# Patient Record
Sex: Female | Born: 1945
Health system: Southern US, Community
[De-identification: ages and names within clinical notes are randomized; demographics above are authoritative.]

## PROBLEM LIST (undated history)

## (undated) DIAGNOSIS — N289 Disorder of kidney and ureter, unspecified: Secondary | ICD-10-CM

## (undated) DIAGNOSIS — Z9851 Tubal ligation status: Secondary | ICD-10-CM

## (undated) DIAGNOSIS — E119 Type 2 diabetes mellitus without complications: Secondary | ICD-10-CM

## (undated) DIAGNOSIS — I10 Essential (primary) hypertension: Secondary | ICD-10-CM

## (undated) HISTORY — PX: KIDNEY TRANSPLANT: SHX239

## (undated) HISTORY — PX: PERITONEAL CATHETER INSERTION: SHX2223

## (undated) HISTORY — PX: PERITONEAL CATHETER REMOVAL: SUR1106

---

## 1997-02-24 ENCOUNTER — Other Ambulatory Visit: Admission: RE | Admit: 1997-02-24 | Discharge: 1997-02-24 | Payer: Self-pay | Admitting: Obstetrics and Gynecology

## 1997-06-27 ENCOUNTER — Other Ambulatory Visit: Admission: RE | Admit: 1997-06-27 | Discharge: 1997-06-27 | Payer: Self-pay | Admitting: Obstetrics and Gynecology

## 1997-08-02 ENCOUNTER — Ambulatory Visit (HOSPITAL_COMMUNITY): Admission: RE | Admit: 1997-08-02 | Discharge: 1997-08-02 | Payer: Self-pay | Admitting: Obstetrics and Gynecology

## 1997-08-04 ENCOUNTER — Other Ambulatory Visit: Admission: RE | Admit: 1997-08-04 | Discharge: 1997-08-04 | Payer: Self-pay | Admitting: Obstetrics and Gynecology

## 1997-08-19 ENCOUNTER — Ambulatory Visit (HOSPITAL_COMMUNITY): Admission: RE | Admit: 1997-08-19 | Discharge: 1997-08-19 | Payer: Self-pay | Admitting: Obstetrics and Gynecology

## 1997-11-28 ENCOUNTER — Other Ambulatory Visit: Admission: RE | Admit: 1997-11-28 | Discharge: 1997-11-28 | Payer: Self-pay | Admitting: Obstetrics and Gynecology

## 1998-03-16 ENCOUNTER — Ambulatory Visit (HOSPITAL_COMMUNITY): Admission: RE | Admit: 1998-03-16 | Discharge: 1998-03-16 | Payer: Self-pay | Admitting: Obstetrics and Gynecology

## 1998-03-16 ENCOUNTER — Encounter: Payer: Self-pay | Admitting: Obstetrics and Gynecology

## 1998-03-20 ENCOUNTER — Other Ambulatory Visit: Admission: RE | Admit: 1998-03-20 | Discharge: 1998-03-20 | Payer: Self-pay | Admitting: Obstetrics and Gynecology

## 1998-08-18 ENCOUNTER — Other Ambulatory Visit: Admission: RE | Admit: 1998-08-18 | Discharge: 1998-08-18 | Payer: Self-pay | Admitting: Obstetrics and Gynecology

## 1998-09-01 ENCOUNTER — Ambulatory Visit (HOSPITAL_COMMUNITY): Admission: RE | Admit: 1998-09-01 | Discharge: 1998-09-01 | Payer: Self-pay | Admitting: Obstetrics and Gynecology

## 1998-09-01 ENCOUNTER — Encounter: Payer: Self-pay | Admitting: Obstetrics and Gynecology

## 1998-09-13 ENCOUNTER — Ambulatory Visit (HOSPITAL_COMMUNITY): Admission: RE | Admit: 1998-09-13 | Discharge: 1998-09-13 | Payer: Self-pay | Admitting: Obstetrics and Gynecology

## 1998-09-13 ENCOUNTER — Encounter: Payer: Self-pay | Admitting: Obstetrics and Gynecology

## 1998-10-10 ENCOUNTER — Encounter (INDEPENDENT_AMBULATORY_CARE_PROVIDER_SITE_OTHER): Payer: Self-pay | Admitting: Specialist

## 1998-10-10 ENCOUNTER — Ambulatory Visit (HOSPITAL_COMMUNITY): Admission: RE | Admit: 1998-10-10 | Discharge: 1998-10-10 | Payer: Self-pay | Admitting: Obstetrics and Gynecology

## 1999-08-23 ENCOUNTER — Other Ambulatory Visit: Admission: RE | Admit: 1999-08-23 | Discharge: 1999-08-23 | Payer: Self-pay | Admitting: Obstetrics and Gynecology

## 1999-10-10 ENCOUNTER — Ambulatory Visit (HOSPITAL_COMMUNITY): Admission: RE | Admit: 1999-10-10 | Discharge: 1999-10-10 | Payer: Self-pay | Admitting: Obstetrics and Gynecology

## 1999-10-10 ENCOUNTER — Encounter: Payer: Self-pay | Admitting: Obstetrics and Gynecology

## 2000-03-10 ENCOUNTER — Other Ambulatory Visit: Admission: RE | Admit: 2000-03-10 | Discharge: 2000-03-10 | Payer: Self-pay | Admitting: Obstetrics and Gynecology

## 2000-08-27 ENCOUNTER — Other Ambulatory Visit: Admission: RE | Admit: 2000-08-27 | Discharge: 2000-08-27 | Payer: Self-pay | Admitting: Obstetrics and Gynecology

## 2000-10-15 ENCOUNTER — Encounter: Payer: Self-pay | Admitting: Obstetrics and Gynecology

## 2000-10-15 ENCOUNTER — Ambulatory Visit (HOSPITAL_COMMUNITY): Admission: RE | Admit: 2000-10-15 | Discharge: 2000-10-15 | Payer: Self-pay | Admitting: Obstetrics and Gynecology

## 2000-11-11 ENCOUNTER — Encounter: Payer: Self-pay | Admitting: Family Medicine

## 2000-11-11 ENCOUNTER — Ambulatory Visit (HOSPITAL_COMMUNITY): Admission: RE | Admit: 2000-11-11 | Discharge: 2000-11-11 | Payer: Self-pay | Admitting: Family Medicine

## 2001-03-05 ENCOUNTER — Other Ambulatory Visit: Admission: RE | Admit: 2001-03-05 | Discharge: 2001-03-05 | Payer: Self-pay | Admitting: Obstetrics and Gynecology

## 2001-09-10 ENCOUNTER — Other Ambulatory Visit: Admission: RE | Admit: 2001-09-10 | Discharge: 2001-09-10 | Payer: Self-pay | Admitting: Obstetrics and Gynecology

## 2001-11-24 ENCOUNTER — Ambulatory Visit (HOSPITAL_COMMUNITY): Admission: RE | Admit: 2001-11-24 | Discharge: 2001-11-24 | Payer: Self-pay | Admitting: Obstetrics and Gynecology

## 2001-11-24 ENCOUNTER — Encounter: Payer: Self-pay | Admitting: Obstetrics and Gynecology

## 2002-03-08 ENCOUNTER — Other Ambulatory Visit: Admission: RE | Admit: 2002-03-08 | Discharge: 2002-03-08 | Payer: Self-pay | Admitting: Obstetrics and Gynecology

## 2002-10-07 ENCOUNTER — Other Ambulatory Visit: Admission: RE | Admit: 2002-10-07 | Discharge: 2002-10-07 | Payer: Self-pay | Admitting: Obstetrics and Gynecology

## 2003-01-04 ENCOUNTER — Ambulatory Visit (HOSPITAL_COMMUNITY): Admission: RE | Admit: 2003-01-04 | Discharge: 2003-01-04 | Payer: Self-pay | Admitting: Obstetrics and Gynecology

## 2003-05-06 ENCOUNTER — Other Ambulatory Visit: Admission: RE | Admit: 2003-05-06 | Discharge: 2003-05-06 | Payer: Self-pay | Admitting: Obstetrics and Gynecology

## 2003-10-10 ENCOUNTER — Other Ambulatory Visit: Admission: RE | Admit: 2003-10-10 | Discharge: 2003-10-10 | Payer: Self-pay | Admitting: *Deleted

## 2004-01-25 ENCOUNTER — Ambulatory Visit (HOSPITAL_COMMUNITY): Admission: RE | Admit: 2004-01-25 | Discharge: 2004-01-25 | Payer: Self-pay | Admitting: Obstetrics and Gynecology

## 2004-03-27 ENCOUNTER — Other Ambulatory Visit: Admission: RE | Admit: 2004-03-27 | Discharge: 2004-03-27 | Payer: Self-pay | Admitting: *Deleted

## 2004-10-10 ENCOUNTER — Other Ambulatory Visit: Admission: RE | Admit: 2004-10-10 | Discharge: 2004-10-10 | Payer: Self-pay | Admitting: Obstetrics and Gynecology

## 2005-02-08 ENCOUNTER — Ambulatory Visit (HOSPITAL_COMMUNITY): Admission: RE | Admit: 2005-02-08 | Discharge: 2005-02-08 | Payer: Self-pay | Admitting: Obstetrics and Gynecology

## 2005-10-16 ENCOUNTER — Other Ambulatory Visit: Admission: RE | Admit: 2005-10-16 | Discharge: 2005-10-16 | Payer: Self-pay | Admitting: Obstetrics and Gynecology

## 2006-03-04 ENCOUNTER — Ambulatory Visit (HOSPITAL_COMMUNITY): Admission: RE | Admit: 2006-03-04 | Discharge: 2006-03-04 | Payer: Self-pay | Admitting: *Deleted

## 2006-10-22 ENCOUNTER — Other Ambulatory Visit: Admission: RE | Admit: 2006-10-22 | Discharge: 2006-10-22 | Payer: Self-pay | Admitting: Obstetrics and Gynecology

## 2007-03-23 ENCOUNTER — Ambulatory Visit (HOSPITAL_COMMUNITY): Admission: RE | Admit: 2007-03-23 | Discharge: 2007-03-23 | Payer: Self-pay | Admitting: Obstetrics and Gynecology

## 2007-12-28 ENCOUNTER — Other Ambulatory Visit: Admission: RE | Admit: 2007-12-28 | Discharge: 2007-12-28 | Payer: Self-pay | Admitting: Obstetrics and Gynecology

## 2008-03-29 ENCOUNTER — Encounter (HOSPITAL_COMMUNITY): Admission: RE | Admit: 2008-03-29 | Discharge: 2008-06-27 | Payer: Self-pay | Admitting: Nephrology

## 2008-04-24 ENCOUNTER — Inpatient Hospital Stay (HOSPITAL_COMMUNITY): Admission: EM | Admit: 2008-04-24 | Discharge: 2008-04-29 | Payer: Self-pay | Admitting: Emergency Medicine

## 2008-04-26 ENCOUNTER — Ambulatory Visit: Payer: Self-pay | Admitting: Vascular Surgery

## 2008-04-27 ENCOUNTER — Encounter (INDEPENDENT_AMBULATORY_CARE_PROVIDER_SITE_OTHER): Payer: Self-pay | Admitting: *Deleted

## 2008-05-09 ENCOUNTER — Ambulatory Visit: Payer: Self-pay | Admitting: Surgery

## 2008-05-20 ENCOUNTER — Ambulatory Visit (HOSPITAL_COMMUNITY): Admission: RE | Admit: 2008-05-20 | Discharge: 2008-05-20 | Payer: Self-pay | Admitting: Surgery

## 2008-05-20 ENCOUNTER — Ambulatory Visit: Payer: Self-pay | Admitting: Surgery

## 2008-06-10 ENCOUNTER — Ambulatory Visit (HOSPITAL_COMMUNITY): Admission: RE | Admit: 2008-06-10 | Discharge: 2008-06-10 | Payer: Self-pay | Admitting: Surgery

## 2008-07-25 ENCOUNTER — Ambulatory Visit: Payer: Self-pay | Admitting: Surgery

## 2008-09-14 ENCOUNTER — Ambulatory Visit: Payer: Self-pay | Admitting: Vascular Surgery

## 2008-09-21 ENCOUNTER — Ambulatory Visit: Payer: Self-pay | Admitting: Surgery

## 2008-09-21 ENCOUNTER — Ambulatory Visit (HOSPITAL_COMMUNITY): Admission: RE | Admit: 2008-09-21 | Discharge: 2008-09-21 | Payer: Self-pay | Admitting: Surgery

## 2008-10-05 ENCOUNTER — Ambulatory Visit (HOSPITAL_COMMUNITY): Admission: RE | Admit: 2008-10-05 | Discharge: 2008-10-05 | Payer: Self-pay | Admitting: Surgery

## 2008-10-31 ENCOUNTER — Encounter: Admission: RE | Admit: 2008-10-31 | Discharge: 2008-10-31 | Payer: Self-pay | Admitting: Internal Medicine

## 2008-10-31 ENCOUNTER — Ambulatory Visit: Payer: Self-pay | Admitting: Surgery

## 2008-12-23 ENCOUNTER — Ambulatory Visit (HOSPITAL_COMMUNITY): Admission: RE | Admit: 2008-12-23 | Discharge: 2008-12-23 | Payer: Self-pay | Admitting: Nephrology

## 2009-01-11 ENCOUNTER — Ambulatory Visit: Payer: Self-pay | Admitting: Surgery

## 2009-02-07 ENCOUNTER — Emergency Department (HOSPITAL_COMMUNITY): Admission: EM | Admit: 2009-02-07 | Discharge: 2009-02-07 | Payer: Self-pay | Admitting: Emergency Medicine

## 2009-10-09 IMAGING — CR DG CHEST 2V
2 series · 2 of 2 positions shown · non-contrast
Comparison: 04/26/2008

CLINICAL DATA: Preop.

CHEST - 2 VIEW

[view not recorded (1 of 2)]
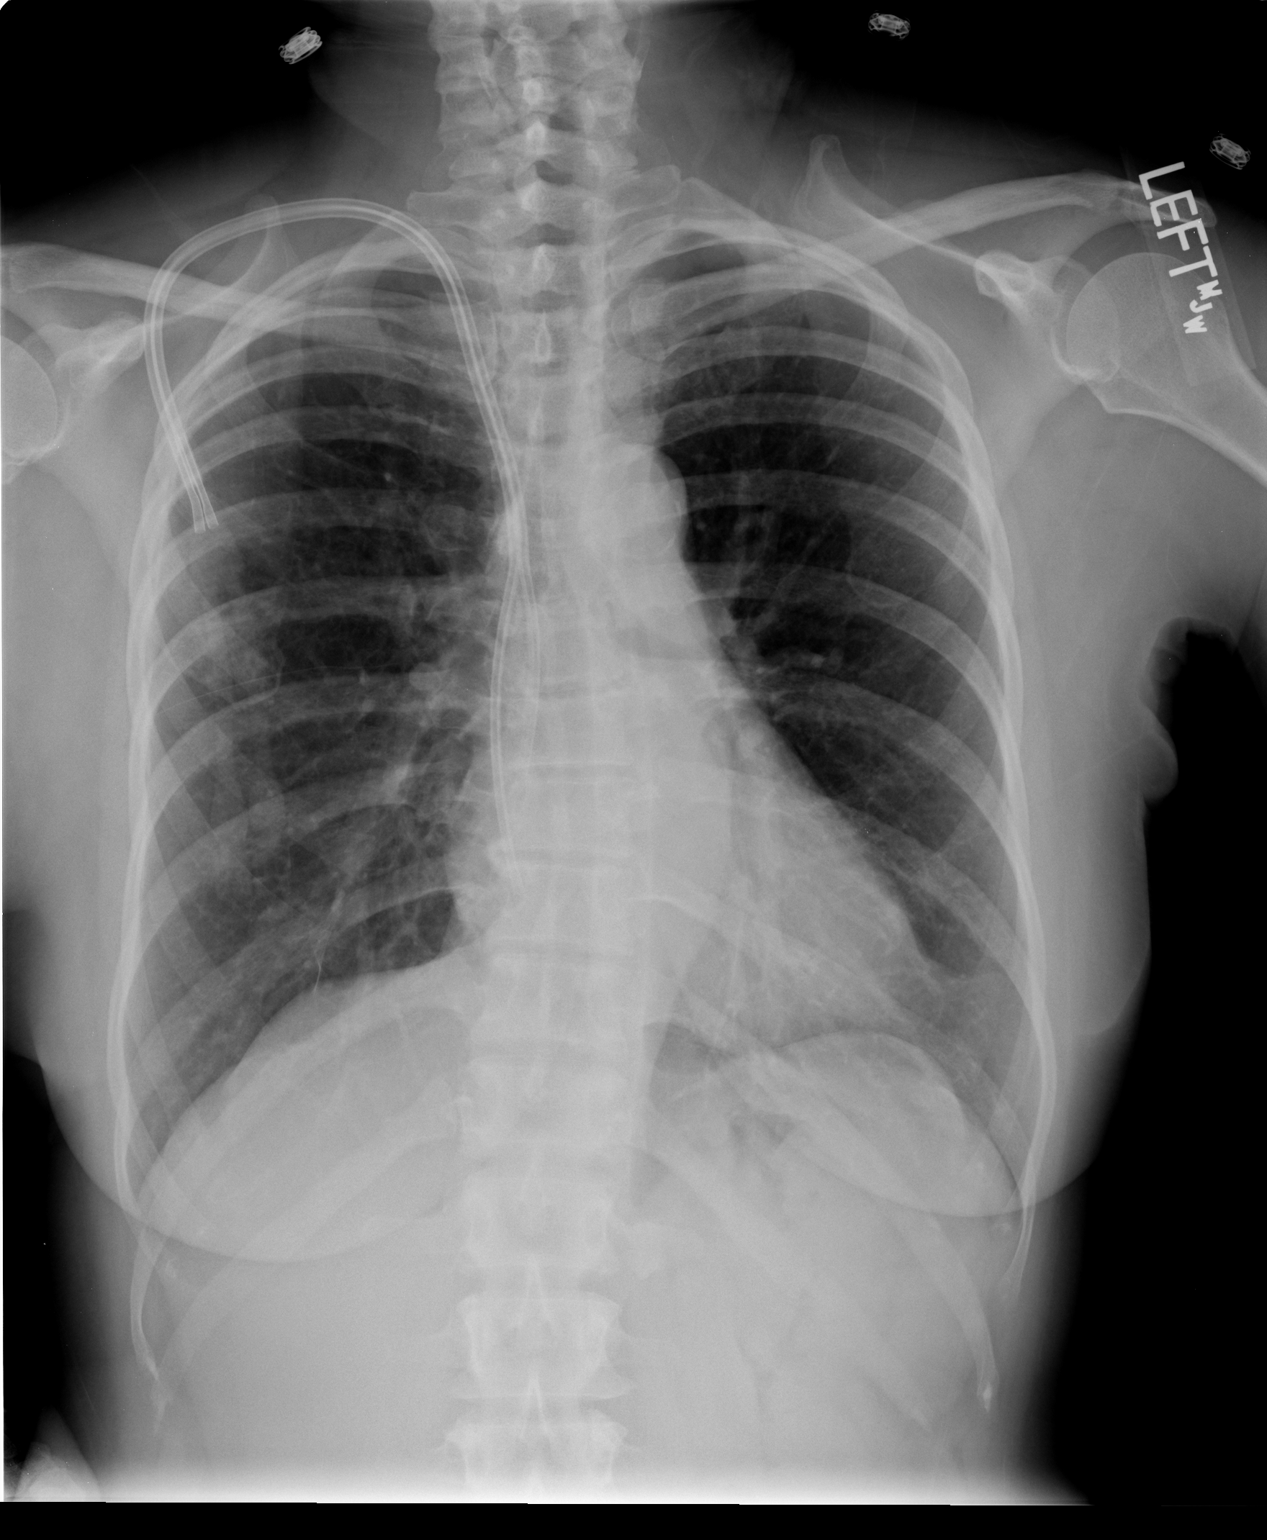

[view not recorded (2 of 2)]
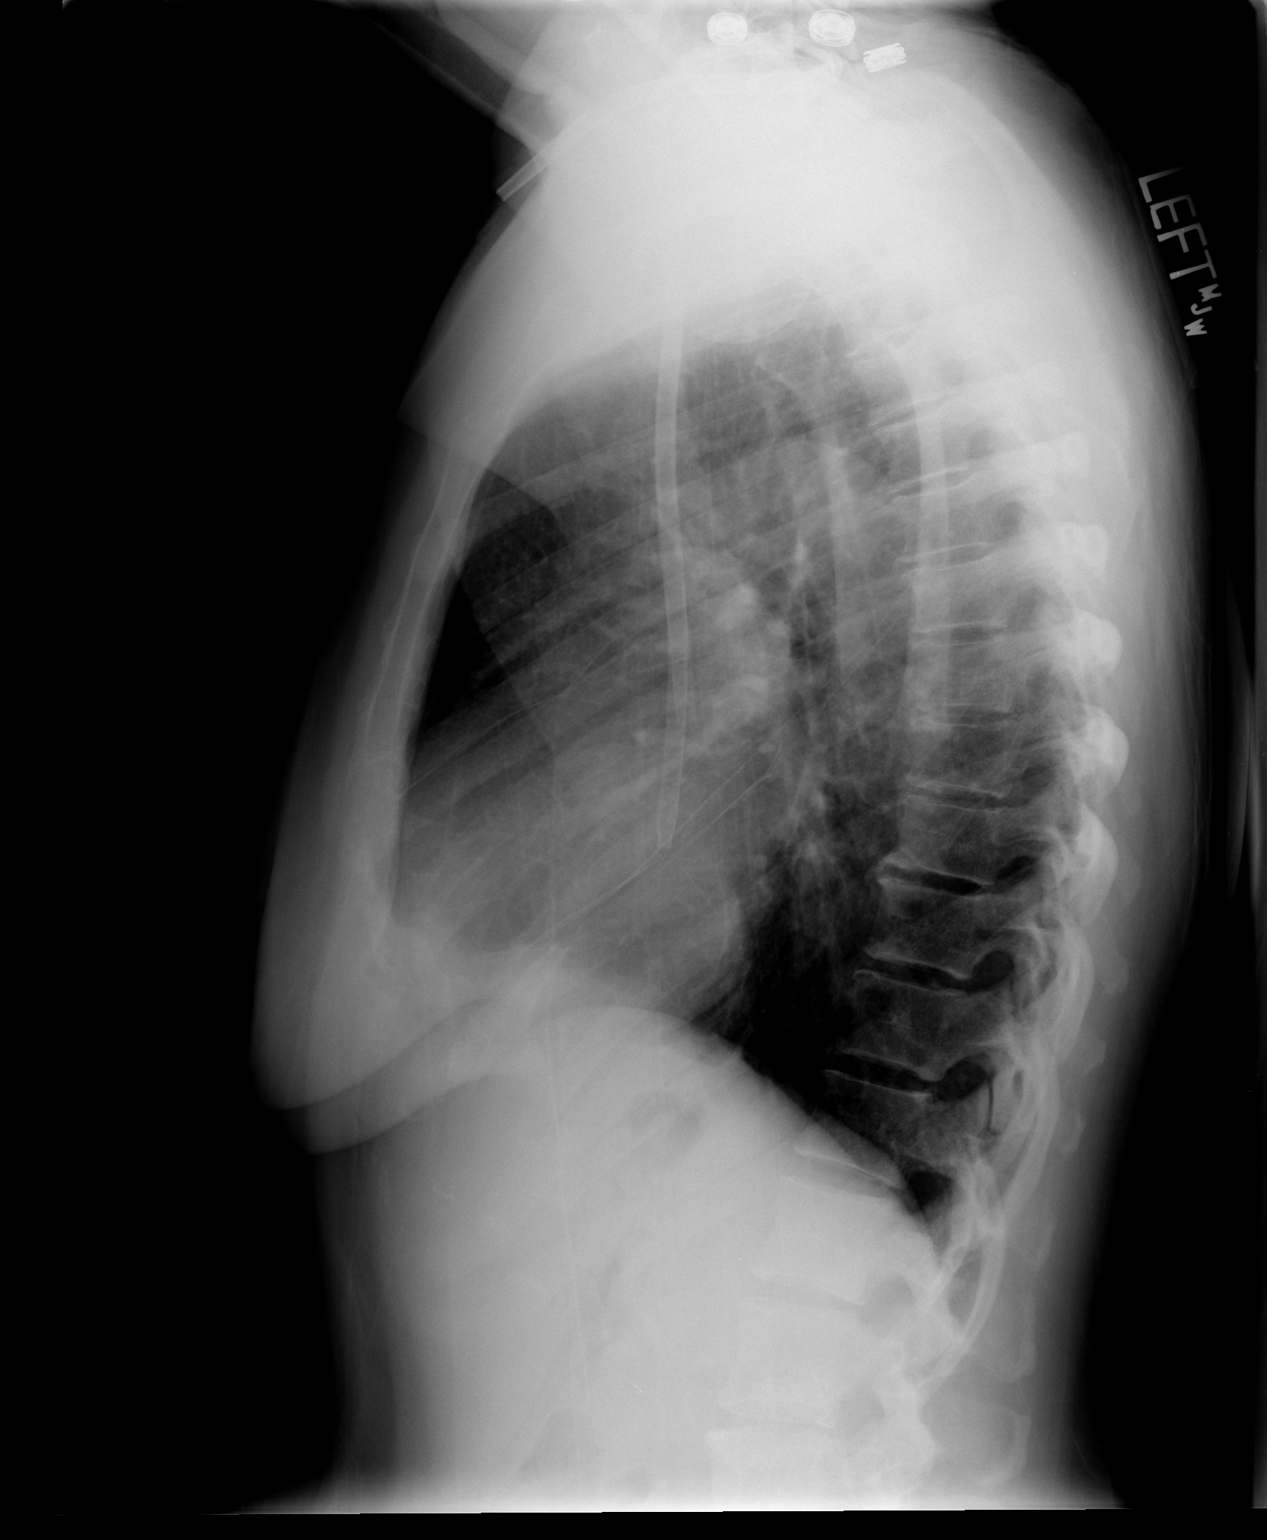

[2 of 2 positions shown; findings below may reference images not displayed]

FINDINGS: Trachea is midline.  Heart size normal.  Thoracic aorta
is calcified.  Right IJ dual lumen catheter tips project over the
SVC/RA junction and right atrium, respectively.  Mild bibasilar
subsegmental atelectasis and/or scarring.  No pleural fluid.  No
edema.
IMPRESSION: Mild bibasilar atelectasis and/or scarring.

## 2010-02-04 ENCOUNTER — Encounter: Payer: Self-pay | Admitting: Obstetrics and Gynecology

## 2010-02-16 ENCOUNTER — Other Ambulatory Visit: Payer: Self-pay

## 2010-02-16 DIAGNOSIS — Z1231 Encounter for screening mammogram for malignant neoplasm of breast: Secondary | ICD-10-CM

## 2010-02-28 ENCOUNTER — Encounter (HOSPITAL_COMMUNITY): Payer: Self-pay

## 2010-02-28 ENCOUNTER — Ambulatory Visit (HOSPITAL_COMMUNITY)
Admission: RE | Admit: 2010-02-28 | Discharge: 2010-02-28 | Disposition: A | Discharge status: Home / Self Care | Payer: Medicare Other | Source: Ambulatory Visit | Attending: Family Medicine | Admitting: Family Medicine

## 2010-02-28 DIAGNOSIS — Z1231 Encounter for screening mammogram for malignant neoplasm of breast: Secondary | ICD-10-CM | POA: Insufficient documentation

## 2010-04-01 LAB — CSF CELL COUNT WITH DIFFERENTIAL
RBC Count, CSF: 0 /mm3
RBC Count, CSF: 0 /mm3
Tube #: 1
Tube #: 4
WBC, CSF: 0 /mm3 (ref 0–5)
WBC, CSF: 0 /mm3 (ref 0–5)

## 2010-04-01 LAB — DIFFERENTIAL
Eosinophils Absolute: 0.1 10*3/uL (ref 0.0–0.7)
Lymphocytes Relative: 13 % (ref 12–46)
Lymphs Abs: 0.7 10*3/uL (ref 0.7–4.0)
Monocytes Relative: 8 % (ref 3–12)
Neutrophils Relative %: 77 % (ref 43–77)

## 2010-04-01 LAB — CBC
HCT: 37.7 % (ref 36.0–46.0)
MCV: 102 fL — ABNORMAL HIGH (ref 78.0–100.0)
RBC: 3.7 MIL/uL — ABNORMAL LOW (ref 3.87–5.11)
WBC: 5.5 10*3/uL (ref 4.0–10.5)

## 2010-04-01 LAB — POCT I-STAT, CHEM 8
BUN: 28 mg/dL — ABNORMAL HIGH (ref 6–23)
Calcium, Ion: 1.06 mmol/L — ABNORMAL LOW (ref 1.12–1.32)
Chloride: 99 meq/L (ref 96–112)
Creatinine, Ser: 8.1 mg/dL — ABNORMAL HIGH (ref 0.4–1.2)
Glucose, Bld: 92 mg/dL (ref 70–99)
HCT: 38 % (ref 36.0–46.0)
Hemoglobin: 12.9 g/dL (ref 12.0–15.0)
Potassium: 4.3 meq/L (ref 3.5–5.1)
Sodium: 138 meq/L (ref 135–145)
TCO2: 38 mmol/L (ref 0–100)

## 2010-04-01 LAB — CSF CULTURE W GRAM STAIN: Culture: NO GROWTH

## 2010-04-01 LAB — PROTEIN AND GLUCOSE, CSF
Glucose, CSF: 59 mg/dL (ref 43–76)
Total  Protein, CSF: 27 mg/dL (ref 15–45)

## 2010-04-01 LAB — PROTIME-INR: INR: 1.02 (ref 0.00–1.49)

## 2010-04-20 LAB — POCT I-STAT, CHEM 8
BUN: 31 mg/dL — ABNORMAL HIGH (ref 6–23)
Calcium, Ion: 1.01 mmol/L — ABNORMAL LOW (ref 1.12–1.32)
Chloride: 102 mEq/L (ref 96–112)
Creatinine, Ser: 9.2 mg/dL — ABNORMAL HIGH (ref 0.4–1.2)
TCO2: 29 mmol/L (ref 0–100)

## 2010-04-20 LAB — POCT I-STAT 4, (NA,K, GLUC, HGB,HCT)
Potassium: 4.4 mEq/L (ref 3.5–5.1)
Sodium: 135 mEq/L (ref 135–145)

## 2010-04-24 LAB — POCT I-STAT 4, (NA,K, GLUC, HGB,HCT)
Glucose, Bld: 86 mg/dL (ref 70–99)
HCT: 43 % (ref 36.0–46.0)
Hemoglobin: 14.6 g/dL (ref 12.0–15.0)
Potassium: 3.9 mEq/L (ref 3.5–5.1)
Sodium: 138 mEq/L (ref 135–145)

## 2010-04-25 LAB — BASIC METABOLIC PANEL
BUN: 131 mg/dL — ABNORMAL HIGH (ref 6–23)
CO2: 15 mEq/L — ABNORMAL LOW (ref 19–32)
Calcium: 9 mg/dL (ref 8.4–10.5)
Creatinine, Ser: 19.27 mg/dL — ABNORMAL HIGH (ref 0.4–1.2)
GFR calc non Af Amer: 3 mL/min — ABNORMAL LOW (ref >60)
Glucose, Bld: 125 mg/dL — ABNORMAL HIGH (ref 70–99)
Glucose, Bld: 129 mg/dL — ABNORMAL HIGH (ref 70–99)
Potassium: 4.3 mEq/L (ref 3.5–5.1)
Sodium: 135 mEq/L (ref 135–145)

## 2010-04-25 LAB — IRON AND TIBC
Iron: 12 ug/dL — ABNORMAL LOW (ref 42–135)
Saturation Ratios: 9 % — ABNORMAL LOW (ref 20–55)

## 2010-04-25 LAB — POCT I-STAT 3, ART BLOOD GAS (G3+)
Acid-base deficit: 13 mmol/L — ABNORMAL HIGH (ref 0.0–2.0)
Bicarbonate: 11.2 mEq/L — ABNORMAL LOW (ref 20.0–24.0)
O2 Saturation: 97 %
pO2, Arterial: 100 mmHg (ref 80.0–100.0)

## 2010-04-25 LAB — HEMOGLOBIN: Hemoglobin: 10.6 g/dL — ABNORMAL LOW (ref 12.0–15.0)

## 2010-04-25 LAB — RENAL FUNCTION PANEL
Albumin: 2.4 g/dL — ABNORMAL LOW (ref 3.5–5.2)
Calcium: 8.5 mg/dL (ref 8.4–10.5)
Creatinine, Ser: 13.93 mg/dL — ABNORMAL HIGH (ref 0.4–1.2)
GFR calc Af Amer: 3 mL/min — ABNORMAL LOW (ref >60)
GFR calc non Af Amer: 3 mL/min — ABNORMAL LOW (ref >60)

## 2010-04-25 LAB — POCT I-STAT, CHEM 8
BUN: >140 mg/dL — ABNORMAL HIGH (ref 6–23)
Calcium, Ion: 1.02 mmol/L — ABNORMAL LOW (ref 1.12–1.32)
Creatinine, Ser: 16 mg/dL — ABNORMAL HIGH (ref 0.4–1.2)
TCO2: 7 mmol/L (ref 0–100)

## 2010-04-25 LAB — COMPREHENSIVE METABOLIC PANEL
ALT: 8 U/L (ref 0–35)
AST: 10 U/L (ref 0–37)
Albumin: 2.2 g/dL — ABNORMAL LOW (ref 3.5–5.2)
Alkaline Phosphatase: 37 U/L — ABNORMAL LOW (ref 39–117)
Chloride: 97 mEq/L (ref 96–112)
GFR calc Af Amer: 3 mL/min — ABNORMAL LOW (ref >60)
Potassium: 4.5 mEq/L (ref 3.5–5.1)
Sodium: 131 mEq/L — ABNORMAL LOW (ref 135–145)
Total Bilirubin: 0.6 mg/dL (ref 0.3–1.2)
Total Protein: 5.3 g/dL — ABNORMAL LOW (ref 6.0–8.3)

## 2010-04-25 LAB — CBC
HCT: 21.4 % — ABNORMAL LOW (ref 36.0–46.0)
HCT: 22.8 % — ABNORMAL LOW (ref 36.0–46.0)
Hemoglobin: 8.5 g/dL — ABNORMAL LOW (ref 12.0–15.0)
MCHC: 34.1 g/dL (ref 30.0–36.0)
Platelets: 220 10*3/uL (ref 150–400)
Platelets: 227 10*3/uL (ref 150–400)
Platelets: 248 10*3/uL (ref 150–400)
RDW: 15.3 % (ref 11.5–15.5)
RDW: 15.6 % — ABNORMAL HIGH (ref 11.5–15.5)
WBC: 8.8 10*3/uL (ref 4.0–10.5)

## 2010-04-25 LAB — TYPE AND SCREEN: Antibody Screen: NEGATIVE

## 2010-04-25 LAB — FOLATE RBC: RBC Folate: 1618 ng/mL — ABNORMAL HIGH (ref 180–600)

## 2010-04-25 LAB — URINALYSIS, ROUTINE W REFLEX MICROSCOPIC
Bilirubin Urine: NEGATIVE
Ketones, ur: NEGATIVE mg/dL
Nitrite: NEGATIVE
Specific Gravity, Urine: 1.017 (ref 1.005–1.030)
Urobilinogen, UA: 0.2 mg/dL (ref 0.0–1.0)

## 2010-04-25 LAB — DIFFERENTIAL
Basophils Absolute: 0 10*3/uL (ref 0.0–0.1)
Basophils Relative: 0 % (ref 0–1)
Monocytes Absolute: 0.3 10*3/uL (ref 0.1–1.0)
Neutro Abs: 6.3 10*3/uL (ref 1.7–7.7)
Neutrophils Relative %: 86 % — ABNORMAL HIGH (ref 43–77)

## 2010-04-25 LAB — ALT: ALT: 21 U/L (ref 0–35)

## 2010-04-25 LAB — POCT CARDIAC MARKERS: Troponin i, poc: <0.05 ng/mL (ref 0.00–0.09)

## 2010-04-25 LAB — URINE MICROSCOPIC-ADD ON

## 2010-04-25 LAB — PTH, INTACT AND CALCIUM: PTH: 617.6 pg/mL — ABNORMAL HIGH (ref 14.0–72.0)

## 2010-04-25 LAB — ABO/RH: ABO/RH(D): B POS

## 2010-04-25 LAB — HEPATITIS B SURFACE ANTIBODY,QUALITATIVE: Hep B S Ab: NEGATIVE

## 2010-04-25 LAB — FERRITIN: Ferritin: 148 ng/mL (ref 10–291)

## 2010-04-26 LAB — POCT HEMOGLOBIN-HEMACUE: Hemoglobin: 7.3 g/dL — CL (ref 12.0–15.0)

## 2010-05-08 ENCOUNTER — Other Ambulatory Visit (HOSPITAL_COMMUNITY): Payer: Self-pay | Admitting: Nephrology

## 2010-05-08 DIAGNOSIS — N186 End stage renal disease: Secondary | ICD-10-CM

## 2010-05-09 ENCOUNTER — Ambulatory Visit (HOSPITAL_COMMUNITY)
Admission: RE | Admit: 2010-05-09 | Discharge: 2010-05-09 | Disposition: A | Discharge status: Home / Self Care | Payer: Medicare Other | Source: Ambulatory Visit | Attending: Nephrology | Admitting: Nephrology

## 2010-05-09 DIAGNOSIS — T82898A Other specified complication of vascular prosthetic devices, implants and grafts, initial encounter: Secondary | ICD-10-CM | POA: Insufficient documentation

## 2010-05-09 DIAGNOSIS — N186 End stage renal disease: Secondary | ICD-10-CM | POA: Insufficient documentation

## 2010-05-09 DIAGNOSIS — Y832 Surgical operation with anastomosis, bypass or graft as the cause of abnormal reaction of the patient, or of later complication, without mention of misadventure at the time of the procedure: Secondary | ICD-10-CM | POA: Insufficient documentation

## 2010-05-09 MED ORDER — IOHEXOL 300 MG/ML  SOLN
100.0000 mL | Freq: Once | INTRAMUSCULAR | Status: AC | PRN
  Administered 2010-05-09: 36 mL via INTRAVENOUS

## 2010-05-18 ENCOUNTER — Other Ambulatory Visit (HOSPITAL_COMMUNITY): Payer: Self-pay | Admitting: Family Medicine

## 2010-05-29 NOTE — Procedures (Signed)
CEPHALIC VEIN MAPPING   INDICATION:  Preop cephalic vein mapping.   HISTORY:  End-stage renal disease.   EXAM:   The right cephalic vein is compressible.   Diameter measurements range from 0.66 to 0.25 cm.   The left cephalic vein is compressible.   Diameter measurements range from 0.43 to 0.24 cm.   See attached worksheet for all measurements.   IMPRESSION:  Patent bilateral cephalic veins which are of acceptable  diameter for use as a dialysis access site.   ___________________________________________  V. Leia Alf, MD   AC/MEDQ  D:  05/09/2008  T:  05/09/2008  Job:  [id number]

## 2010-05-29 NOTE — Op Note (Signed)
[name], VESSELS                  ACCOUNT NO.:  [id number]   MEDICAL RECORD NO.:  [id number]          PATIENT TYPE:  AMB   LOCATION:  SDS                          FACILITY:  Home   PHYSICIAN:  Theotis Burrow IV, MDDATE OF BIRTH:  Aug 15, 1945   DATE OF PROCEDURE:  06/10/2008  DATE OF DISCHARGE:  06/10/2008                               OPERATIVE REPORT   PREOPERATIVE DIAGNOSIS:  End-stage renal disease.   POSTOPERATIVE DIAGNOSIS:  End-stage renal disease.   PROCEDURE PERFORMED:  Left upper arm arteriovenous fistula.   SURGEON:  1. Leia Alf, MD   ASSISTANT:  Chad Cordial, PA   ANESTHESIA:  General.   FINDINGS:  Vein accepted a #4 dilator.   INDICATIONS:  This is a 65 year old female with end-stage renal disease.  Three weeks ago, she underwent a left AV fistula, which thrombosed.  She  comes in today for upper arm fistula versus graft.   PROCEDURE:  The patient identified in the holding area and taken to room  6.  She was placed supine on the table.  General anesthesia was  administered.  The patient was prepped and draped in a standard sterile  fashion.  A time-out was called.  Antibiotics were given.  Ultrasound  was used to identify the course of cephalic vein in the upper arm.  It  was an adequate caliber vein and easily compressible. A transverse  incision was made in the antecubital crease.  The vein was identified  within the incision.  It was lateral and the arm was encircled with a  vessel loop.  It was mobilized distally for approximately 1.5 cm as I  needed extra vein to reach the artery.  The vein was also mobilized up  the arm.  Side branches were divided between 3-0 silk ties and metal  clips.  The vein was then marked with an ink pen to ensure proper  orientation.  Next, brachial artery was identified under the fascia that  was mobilized proximally and distally.  This was a 2.5-mm artery.  Next,  the patient was given systemic heparinization.  A  right angle was placed  on the distal exposed vein.  It was then transected.  The distal end was  ligated with a 3-0 silk tie.  Next, the artery was occluded with Serafin  clamps.  The vein was then dilated with sequential coronary dilators up  to a #4 dilator.  There was good back bleeding.  The vein easily flushed  with heparinized saline.  A #11 blade was used to make an arteriotomy,  which was extended with Potts scissors.  Vein was then further  spatulated and end-to-side anastomosis was performed using running 6-0  Prolene.  Prior to completion, the artery was flushed in antegrade and  retrograde fashion.  The anastomosis was then secured.  Clamps were  released.  I then made sure that there were no  kinks  within the vein.  There was a palpable thrill up to the upper arm.  The  wound was then irrigated.  The deep tissue was reapproximated  with 3-0  Vicryl and Dermabond was placed.  The patient tolerated the procedure  well and had no complications.      [name], MD  Electronically Signed     VWB/MEDQ  D:  06/10/2008  T:  06/11/2008  Job:  [id number]

## 2010-05-29 NOTE — Op Note (Signed)
NAMECHRISSA, [name]                  ACCOUNT NO.:  [id number]   MEDICAL RECORD NO.:  [id number]          PATIENT TYPE:  INP   LOCATION:  6711                         FACILITY:  [facility]   PHYSICIAN:  Jessy Oto. Fields, MD  DATE OF BIRTH:  02-Nov-1945   DATE OF PROCEDURE:  04/26/2008  DATE OF DISCHARGE:                               OPERATIVE REPORT   PROCEDURES:  1. Ultrasound of neck  2. Right internal jugular vein Diatek catheter.   PREOPERATIVE DIAGNOSIS:  Renal failure.   POSTOPERATIVE DIAGNOSIS:  Renal failure.   ANESTHESIA:  Local with IV sedation.   OPERATIVE DETAILS:  After obtaining informed consent, the patient was  taken to the operating room.  The patient was placed in supine position  on the operating table.  After adequate sedation, the patient's neck was  inspected with an ultrasound.  The right internal jugular vein was found  to have normal compressibility and respiratory variation.  Next, the  patient's entire neck and chest were prepped and draped in usual sterile  fashion.  Local anesthesia was infiltrated over the right internal  jugular vein.  An introducer needle was used to cannulate the right  internal jugular vein under ultrasound guidance.  A 0.035 J-tip  guidewire was then threaded through the right internal jugular vein down  to the inferior vena cava under fluoroscopic guidance.  Next, sequential  12, 14, and 16-French dilators with peel-away sheath placed over the  guidewire down into the right atrium.  Guidewire and dilator were  removed, and a 23-cm Diatek catheter was placed through the peel-away  sheath down into the right atrium.  The peel-away sheath was then  removed.  The catheter was tunneled subcutaneously, cut to length, and  the hub attached.  The catheter was noted to flush and draw easily.  Catheter was inspected under fluoroscopy, found its tip to be in the  right atrium, no kinks throughout its course.  The catheter was sutured  to skin  with nylon sutures.  The neck insertion site was closed with 0  Vicryl stitch.  The patient tolerated the procedure well and there were  no complications.  Instrument, sponge, and needle counts were correct at  the end of the case.  The patient was taken to recovery room in stable  condition.      Jessy Oto. Fields, MD  Electronically Signed     CEF/MEDQ  D:  04/26/2008  T:  04/27/2008  Job:  [id number]

## 2010-05-29 NOTE — Procedures (Signed)
VASCULAR LAB EXAM   INDICATION:  Difficulty with venous cannulation of the left arm AV  fistula.   HISTORY:  Diabetes:  No.  Cardiac:  No.  Hypertension:  Yes.   EXAM:  Left arm AV fistula duplex.   IMPRESSION:  1. Patent left brachiocephalic AV fistula.  2. There is an elevated velocity of 610 cm/sec noted in the left      outflow vein at the distal brachium level which is most likely due      to vessel tortuosity.  3. There is an elevated velocity of 490 cm/sec noted in the left mid      brachium level outflow vein which appears to be the results of mild      plaque formation.  4. There is a patent branch measuring 0.22 cm noted at the proximal to      mid brachium level outflow vein with minimal internal flow.  5. A preliminary report was faxed to [facility] office on      01/11/2009.     ___________________________________________  V. Leia Alf, MD   CH/MEDQ  D:  01/11/2009  T:  01/12/2009  Job:  [id number]

## 2010-05-29 NOTE — Assessment & Plan Note (Signed)
OFFICE VISIT   [name], Terri L  DOB:  December 05, 1945                                       07/25/2008  [id number]   REASON FOR VISIT:  Follow up fistula.   HISTORY:  This is a 65 year old female who underwent left upper arm AV  fistula on 06/10/08.  She comes in today for follow-up.   Again, I feel a strong thrill within her fistula.  It does takes a  somewhat surreptitious course in her upper arm.  I think it is not quite  ready for use.   I am going to give this another month, and I will see her back then.  Prior to her seeing me to see me in a month, I will get a left arm  fistula ultrasound to see if there are any reasons that this is not  matured.   [name], MD  Electronically Signed   VWB/MEDQ  D:  07/25/2008  T:  07/26/2008  Job:  [id number]   cc:   Louis Meckel, M.D.  Darrold Span. Florene Glen, M.D.

## 2010-05-29 NOTE — Procedures (Signed)
VASCULAR LAB EXAM   INDICATION:  Follow up left upper extremity AVF.   HISTORY:  Diabetes:  No.  Cardiac:  No.  Hypertension:  Yes.   EXAM:  1. Left upper extremity AVF duplex.  2. See attached drawing for velocities.   IMPRESSION:  1. Patent right upper extremity arteriovenous fistula in the brachium.  2. However, elevated velocities at anastomosis and in the cephalic      vein and branch where the branch splits off.  3. Large branch noted off of cephalic vein near the anastomosis.      ___________________________________________  Jessy Oto Fields, MD   AS/MEDQ  D:  09/14/2008  T:  09/14/2008  Job:  [id number]

## 2010-05-29 NOTE — Op Note (Signed)
[name], [name]                  ACCOUNT NO.:  [id number]   MEDICAL RECORD NO.:  [id number]          PATIENT TYPE:  AMB   LOCATION:  SDS                          FACILITY:  [facility]   PHYSICIAN:  Theotis Burrow IV, MDDATE OF BIRTH:  Apr 30, 1945   DATE OF PROCEDURE:  05/20/2008  DATE OF DISCHARGE:  05/20/2008                               OPERATIVE REPORT   PREOPERATIVE DIAGNOSIS:  End-stage renal disease.   POSTOPERATIVE DIAGNOSIS:  End-stage renal disease.   PROCEDURE PERFORMED:  Left wrist AV fistula.   SURGEON:  1. Annamarie Major IV, MD   ANESTHESIA:  General.   BLOOD LOSS:  Minimal.   COMPLICATIONS:  None.   PROCEDURE:  The patient was identified in the holding area and taken to  the room 6.  She was placed supine on the table.  The left arm was  prepped and draped in a standard sterile fashion.  A time-out was  called.  Antibiotics were given.  Ultrasound was used to map the course  of the cephalic vein in the lower arm.  A transverse incision was made 2  finger-breadths above the radial head.  Vein was identified in the  incision and encircled with a vessel loop.  It was mobilized proximally  and distally.  The vein was small in caliber, approximately 2 to 2.5 mm.  Side branches were divided between 3-0 silk ties.  Next, the artery was  identified within the incision, that was exposed proximally and  distally.  The artery after exposure was somewhat redundant.  At this  point, the patient is given 3 dose units of heparin.  The vein was  distal and the vein was transected at the end.  Distal end was ligated  with 2-0 silk tie.  The artery was occluded with vascular clamps.  A #11  blade was used to make an arteriotomy.  Arteriotomy was extended with  Pott scissors.  The vein was then cut to the appropriate length and the  end was spatulated.  An end-to-side anastomosis was created with a  running 7-0 Prolene.  Prior to completion, the repair was evaluated with  a  handheld Doppler.  I was unable to obtain a signal on the radial  artery distal to the anastomosis.  I was concerned that the redundancy  in the artery may be causing a proximal obstruction, had not tied the  anastomosis at this point, therefore it was reopened.  The healed bypass  was re-done with better visualization of the proximal part of the  artery.  It was positioned so that it was not kinked.  I again evaluated  repair with a Doppler, the signals were much improved.  The patient  actually had a palpable radial pulse below the fistula.  The fistula was  palpated under this on  the surface of the arm.  The anastomosis was then completed.  Next, the  wound was irrigated.  Hemostasis was achieved.  Tissue was closed with a  3-0 Vicryl.  Skin was closed with 4-0 Vicryl.  Dermabond was placed.  She was taken  to recovery in stable condition.  There are no  complications.      [name], MD  Electronically Signed     VWB/MEDQ  D:  05/20/2008  T:  05/21/2008  Job:  [id number]

## 2010-05-29 NOTE — Assessment & Plan Note (Signed)
OFFICE VISIT   [name], [name]  DOB:  [birth year]                                       05/09/2008  [id number]   REASON FOR VISIT:  Dialysis access.   CO-REFERRING PHYSICIAN:  Erling Cruz, MD.   HISTORY:  This is a 65 year old female I am seeing at the request of Dr.  Florene Glen and Dr. Moshe Cipro for evaluation of permanent access.  The  patient started dialysis approximately 2 weeks ago and is using her  right-sided catheter.  Her renal failure is secondary to  glomerulonephritis as well as hypertension.  She is right-handed.   PAST MEDICAL HISTORY:  End-stage renal disease, hypercholesterolemia,  hypertension.   FAMILY HISTORY:  Noncontributory.   SOCIAL HISTORY:  She is married with 2 children.  She does not smoke,  she has never smoked.  She does not drink.   REVIEW OF SYSTEMS:  GENERAL:  Positive for weight loss and loss of  appetite.  CARDIAC:  Negative.  PULMONARY:  Negative.  GI:  Negative.  GU:  Positive for new renal failure.  VASCULAR:  Negative.  NEURO:  Negative.  ORTHO:  Positive for arthritis, joint pain, muscle pain.  PSYCH:  Negative.  ENT:  Negative.  HEME:  Negative.   MEDICATIONS:  Please see medical record.   ALLERGIES:  None.   PHYSICAL EXAMINATION:  Blood pressure 123/82, pulse 72, respirations 20.  General:  She is well-appearing, in no distress.  Cardiovascular:  Regular rate and rhythm, respirations nonlabored.  She has a palpable  radial pulse bilaterally.   DIAGNOSTIC STUDIES:  Vein mapping was performed today which reveals her  to have adequate cephalic vein bilaterally.   ASSESSMENT/PLAN:  End-stage renal disease needing permanent access.  I  discussed the risks and benefits of graft versus fistula.  Based on the  size and caliber of her cephalic vein, I feel like we should start by  performing a left radiocephalic fistula.  Her vein measures  approximately 2.4-mm in this area; I discussed the reasoning  for  starting at the wrist.  She understands that there is a risk of non-  maturity and this could require future interventions.  Her operation has  been scheduled for Friday, May 7th.   [name], MD  Electronically Signed   VWB/MEDQ  D:  05/09/2008  T:  05/10/2008  Job:  [id number]   cc:   Louis Meckel, M.D.  Darrold Span. Florene Glen, M.D.

## 2010-05-29 NOTE — Discharge Summary (Signed)
[name], [name]                  ACCOUNT NO.:  [id number]   MEDICAL RECORD NO.:  [id number]          PATIENT TYPE:  INP   LOCATION:  6711                         FACILITY:  [facility]   PHYSICIAN:  Alvin C. Florene Glen, M.D.  DATE OF BIRTH:  1945/09/17   DATE OF ADMISSION:  04/24/2008  DATE OF DISCHARGE:  04/29/2008                               DISCHARGE SUMMARY   ADMITTING DIAGNOSES:  1. End-stage renal failure.  2. Uremia.  3. Metabolic acidosis.  4. Hypertension, uncontrolled.  5. Anemia of chronic disease.  6. Pulmonary edema secondary to volume overload of renal failure.  7. Hyperlipidemia.   DISCHARGE DIAGNOSES:  1. New end-stage renal disease, now on chronic hemodialysis.  2. Hypertension, improved control.  3. Iron-deficiency anemia.  4. Secondary hyperparathyroidism.  5. Metabolic acidosis, resolved with dialysis.  6. Pulmonary edema, resolved with dialysis.  7. Hyperlipidemia.   PROCEDURE:  On April 24, 2008, right femoral Vas-Cath placed by Dr.  Florene Glen.  On April 26, 2008, right internal jugular tunneled and dialysis  catheter placed by Dr. Ruta Hinds.  On April 11, initiation of first  dialysis treatment.   BRIEF HISTORY:  A 84-year black female with chronic kidney disease stage  V followed by Dr. Moshe Cipro at [facility] who was  seen last in the office on February 10 when she was initially assessed  and felt to have chronic glomerulonephritis.  The patient presented to  the emergency room with several weeks of progressive malaise, anorexia,  nausea, vomiting, and marked shortness of breath.  She was found to have  a BUN of greater than 114 and a creatinine of 19 upon admission.  Her O2  saturations were 90%, and her blood pressure was 196/105.  She was  admitted for urgent initiation of dialysis.   LABS ON ADMISSION:  Sodium 136, potassium 5.7, chloride 116, and CO2 of  7.  Hemoglobin 8.6 white count 7300, and platelets 248,000.  Urinalysis  shows greater than 300 mg of protein, 11-20 red blood cells, and 3-6  white blood cells.  Renal ultrasound shows a 10.1-cm right kidney and  9.9-cm left kidney, both are echogenic.  Chest x-ray shows interstitial  edema.   HOSPITAL COURSE:  End-stage renal disease.  The patient underwent  dialysis treatments on April 11, 12, 14, and 15.  She initially  underwent dialysis via femoral Vas-Cath in the first 2 treatments.  That  was discontinued and she had a right internal jugular PermCath placed by  Dr. Oneida Alar.  She tolerated dialysis relatively well.  Overall she had  approximately 5 kg weight loss with ultrafiltration on dialysis.  Her  blood pressure control improved.  Her oxygenation returned to normal.  She was without symptoms and eating well.  Vein mapping was done of both  upper extremities and her left arm shows cephalic vein to be patent  without significant wall thickening.  There is branching in the mid to  distal forearm.  Dr. Oneida Alar' office is to contact the patient to set up  an elective left arm AV Gore-Tex graft versus fistula  on a non-dialysis  day.   The patient's transferrin saturation level returned at 7% with a  ferritin level of 148.  InFeD loading was initiated, and she will  continue to receive 100 mg IV each dialysis x9 more doses then 15 mg IV  weekly.  Aranesp was dosed at maximum dose of 200 mcg x1.  As an  outpatient, she will be continued on EPO 28,000 units IV each dialysis.  She was transfused 2 units of packed red blood cells this admission for  a hemoglobin of 7.3.   Intact parathyroid hormone level 617.6.  For this, she will receive  Hectorol starting at 1.0 mcg IV each dialysis.  Renagel was used as a  phosphate binder at 800 mg 2 pills with each meal.   The patient's blood pressure was approximately 145/81 at the time of  discharge.  Her dry weight is estimated at 62 kg.  She will dialyze for  4 hours every Tuesday, Thursday, and Saturday at 6:45  in the morning at  the [facility].   DISCHARGE MEDICATIONS:  1. Clonidine 0.1 mg b.i.d.  2. Nephro-Vite vitamin 1 daily.  3. Renagel 800 mg 2 with meals.  4. Lipitor 20 mg q.p.m.  5. Zyrtec 10 mg daily.  6. Progesterone half pill daily.  7. Vivelle estradiol tablet 0.025 mg patch 2 times weekly.  8. EPO 28,000 units IV each dialysis.  9. InFeD 100 mg IV each dialysis x9 then 50 mg IV weekly.  10.Hectorol 1.0 mcg IV each dialysis.      [name], P.A.    ______________________________  Darrold Span Florene Glen, M.D.    RRK/MEDQ  D:  04/29/2008  T:  04/30/2008  Job:  [id number]   cc:   [facility]

## 2010-05-29 NOTE — H&P (Signed)
[name], [name]                  ACCOUNT NO.:  [id number]   MEDICAL RECORD NO.:  [id number]          PATIENT TYPE:  INP   LOCATION:  2606                         FACILITY:  [facility]   PHYSICIAN:  Darrold Span. Florene Glen, M.D.  DATE OF BIRTH:  [birth year]   DATE OF ADMISSION:  04/24/2008  DATE OF DISCHARGE:                              HISTORY & PHYSICAL   HISTORY & PHYSICAL:  [name] is a 65 year old female with chronic  kidney disease followed by Dr. Moshe Cipro at [facility].  Dr. Moshe Cipro apparently has been planning renal  replacement therapy.  However, the patient presents now with a couple of  week history of progressive malaise, anorexia, nausea, vomiting, and  shortness of breath.  She presented to the emergency room and she was  found to have a BUN of greater than 114, and creatinine of approximately  19.  She is admitted now for further treatment.   PAST MEDICAL HISTORY:  1. Hypertension from childbirth.  2. History of microhematuria.  3. History of anemia, receiving erythropoietin.   SOCIAL HISTORY:  Nonsmoker, nondrinker.  Employed at the [employer].   FAMILY HISTORY:  Negative for kidney disease.   REVIEW OF SYSTEMS:  Essentially noncontributory except for complaints of  significant back pain that has been bothering her over the past couple  of days, particularly in her flanks.   MEDICATIONS:  Medications prior to admission include blood pressure  medicines, unknown type.   PHYSICAL EXAMINATION:  GENERAL:  Pleasant, obese African American  female.  VITAL SIGNS:  Blood pressure is currently 162/92.  She is afebrile.  Heart rate 108.  HEENT:  Atraumatic, normocephalic.  She is wearing BiPAP.  There is  slight neck vein distention.  LUNGS:  Decreased breath sounds at the bases and a few crackles that are  not audible.  HEART:  Tachycardia.  ABDOMEN:  Soft, obese, no tenderness.  EXTREMITIES:  Trace edema in lower extremities.   LABORATORY  DATA:  Sodium 136, potassium 5.7, chloride 116, CO2 is 27,  BUN greater than 114, creatinine 16 and repeat is 19.  Hemoglobin is  8.6, white blood count 7300, and platelets 248,000.  Urinalysis, greater  than 300 mg protein, 11-20 red blood cells, 3-6 white blood cells.  Renal ultrasound shows a 10.1-cm right kidney, but 9.9 cm left kidney  and both are echogenic.  Chest x-ray, slight amount of cardiomegaly and  evidence of significant interstitial edema.   ASSESSMENT:  1. Renal failure, end stage, with evidence of uremic symptomatology.  2. Hypertension, suboptimal control.  3. Metabolic acidosis, secondary to renal failure.  4. Anemia secondary to renal failure.  5. Pulmonary edema/volume overload secondary to renal failure.   PLAN:  1. Hemodialysis to be started today.  2. We will need to place femoral catheter because the patient is      unable to tolerate supine position.  3. Scheduled Permcath and AV access within the next couple of days.  4. Erythropoietin, multivitamins, phosphate bindings as needed.           ______________________________  Darrold Span. Florene Glen, M.D.     ACP/MEDQ  D:  04/24/2008  T:  04/25/2008  Job:  [id number]

## 2010-05-29 NOTE — Assessment & Plan Note (Signed)
OFFICE VISIT   [name], Terri L  DOB:  September 24, 1945                                       10/31/2008  [id number]   REASON FOR VISIT:  Follow-up.   HISTORY:  This is a 65 year old female with end-stage renal disease on  dialysis through a right-sided catheter.  She underwent left  brachiocephalic fistula on Jun 10, 2008.  She was lost to follow-up from  [place].  Her fistula did not mature.  She has undergone a fistulogram which  revealed a competing branch.  This was ligated, in the operating room at  the end of September on September 22.  She comes back for follow-up.  She has been doing well.  There is a dramatic change in the thrill in  her fistula.  I think we are heading in the right direction with this, I  would not access fistula for 1 month.  I do think this will provide good  access for her.  If there is any questions.  She will contact us again.   [name], MD  Electronically Signed   VWB/MEDQ  D:  10/31/2008  T:  11/01/2008  Job:  [id number]   cc:   Dr. Florene Glen

## 2010-05-29 NOTE — Assessment & Plan Note (Signed)
OFFICE VISIT   [name], [name]  DOB:  1945/04/02                                       09/14/2008  [id number]   The patient was last seen by Dr. Trula Slade on 07/25/2008.  At that time  her fistula but not completely matured and she was scheduled for  followup in August.  For some reason her followup appointment did not  occur and she returned to the office today and a duplex exam of her left  brachiocephalic AV fistula was performed.  She is currently dialyzing  via a right-sided catheter.  The fistula has not matured sufficiently  for use.  She currently dialyzes on Tuesday, Thursday and Saturday in  [place].   PHYSICAL EXAMINATION:  On physical exam today blood pressure is 111/67  in the right arm, pulse 69 and regular.  Left upper extremity shows a  well-healed left antecubital incision with a palpable thrill in the  brachiocephalic fistula.  This has dilated significantly over the  proximal 5 cm.  However, in the upper arm it is difficult to palpate.  There is an audible bruit up into the upper arm.   Duplex ultrasound today showed an increased velocity across the arterial  anastomosis as well as approximately 5 cm from the fistula origin and a  large side branch near this as well.   I believe the best option for the patient at this point would be a  fistulogram and possible angioplasty if there is a high level of  stenosis as suggested by ultrasound.  We have scheduled this with Dr.  Trula Slade on 09/21/2008.  I discussed with the patient today the risks,  benefits, possible complications of fistulogram and angioplasty  including but not limited to bleeding.   Jessy Oto. Fields, MD  Electronically Signed   CEF/MEDQ  D:  09/14/2008  T:  09/15/2008  Job:  [id number]   cc:   Louis Meckel, M.D.  Darrold Span. Florene Glen, M.D.

## 2010-06-20 ENCOUNTER — Ambulatory Visit (HOSPITAL_COMMUNITY): Payer: Medicare Other

## 2010-06-20 ENCOUNTER — Inpatient Hospital Stay (HOSPITAL_COMMUNITY)
Admission: RE | Admit: 2010-06-20 | Discharge: 2010-06-21 | DRG: 673 | Disposition: A | Discharge status: Home / Self Care | Payer: Medicare Other | Source: Ambulatory Visit | Attending: General Surgery | Admitting: General Surgery

## 2010-06-20 DIAGNOSIS — K219 Gastro-esophageal reflux disease without esophagitis: Secondary | ICD-10-CM | POA: Diagnosis present

## 2010-06-20 DIAGNOSIS — N2581 Secondary hyperparathyroidism of renal origin: Secondary | ICD-10-CM | POA: Diagnosis present

## 2010-06-20 DIAGNOSIS — N186 End stage renal disease: Secondary | ICD-10-CM | POA: Diagnosis present

## 2010-06-20 DIAGNOSIS — I120 Hypertensive chronic kidney disease with stage 5 chronic kidney disease or end stage renal disease: Principal | ICD-10-CM | POA: Diagnosis present

## 2010-06-20 DIAGNOSIS — Z992 Dependence on renal dialysis: Secondary | ICD-10-CM

## 2010-06-20 HISTORY — PX: PERITONEAL CATHETER INSERTION: SHX2223

## 2010-06-20 LAB — CBC
Platelets: 248 10*3/uL (ref 150–400)
RBC: 3.95 MIL/uL (ref 3.87–5.11)
WBC: 6.5 10*3/uL (ref 4.0–10.5)

## 2010-06-20 LAB — BASIC METABOLIC PANEL
Calcium: 9.3 mg/dL (ref 8.4–10.5)
Chloride: 92 mEq/L — ABNORMAL LOW (ref 96–112)
GFR calc Af Amer: 5 mL/min — ABNORMAL LOW (ref >60)
GFR calc non Af Amer: 4 mL/min — ABNORMAL LOW (ref >60)
Potassium: 5.2 mEq/L — ABNORMAL HIGH (ref 3.5–5.1)
Potassium: 4.7 mEq/L (ref 3.5–5.1)
Sodium: 138 mEq/L (ref 135–145)
Sodium: 139 mEq/L (ref 135–145)

## 2010-06-20 LAB — DIFFERENTIAL
Basophils Absolute: 0.1 10*3/uL (ref 0.0–0.1)
Basophils Relative: 1 % (ref 0–1)
Eosinophils Absolute: 0.1 10*3/uL (ref 0.0–0.7)
Lymphs Abs: 2.1 10*3/uL (ref 0.7–4.0)
Neutrophils Relative %: 54 % (ref 43–77)

## 2010-06-20 LAB — SURGICAL PCR SCREEN
MRSA, PCR: NEGATIVE
Staphylococcus aureus: NEGATIVE

## 2010-06-21 ENCOUNTER — Ambulatory Visit (HOSPITAL_COMMUNITY): Payer: Medicare Other

## 2010-06-21 LAB — CBC
HCT: 34.2 % — ABNORMAL LOW (ref 36.0–46.0)
Hemoglobin: 11.6 g/dL — ABNORMAL LOW (ref 12.0–15.0)
RDW: 13.4 % (ref 11.5–15.5)
WBC: 7.1 10*3/uL (ref 4.0–10.5)

## 2010-06-21 LAB — BASIC METABOLIC PANEL
GFR calc Af Amer: 3 mL/min — ABNORMAL LOW (ref >60)
GFR calc non Af Amer: 3 mL/min — ABNORMAL LOW (ref >60)
Glucose, Bld: 109 mg/dL — ABNORMAL HIGH (ref 70–99)
Potassium: 5.6 mEq/L — ABNORMAL HIGH (ref 3.5–5.1)
Sodium: 132 mEq/L — ABNORMAL LOW (ref 135–145)

## 2010-06-25 NOTE — Op Note (Signed)
[name], [name]              ACCOUNT NO.:  [id number]  MEDICAL RECORD NO.:  [id number]  LOCATION:  6702                         FACILITY:  [facility]  PHYSICIAN:  Darene Lamer. Ajai Terhaar, M.D.DATE OF BIRTH:  06-29-1945  DATE OF PROCEDURE:  06/20/2010 DATE OF DISCHARGE:                              OPERATIVE REPORT   PREOPERATIVE DIAGNOSIS:  End-stage renal disease, desire for a peritoneal dialysis.  POSTOPERATIVE DIAGNOSIS:  End-stage renal disease, desire for peritoneal dialysis.  SURGICAL PROCEDURES:  Laparoscopic placement of [place] peritoneal dialysis catheter.  SURGEON:  Darene Lamer. Jaisha Villacres, MD  ANESTHESIA:  General.  BRIEF HISTORY:  [name] is a 65 year old female with end-stage renal disease.  She has been on hemodialysis for 2 years but is interested and motivated for a peritoneal dialysis and felt by her renal physician to be a good candidate.  We discussed laparoscopic placement of a peritoneal dialysis catheter including nature of the procedure, indications, risks of anesthetic complications, bleeding, infection, intestinal injury were discussed with long-term risks of the catheter including displacement, occlusion, peritonitis, and exit site infection. She desires to proceed.  She is brought to the operating room for this procedure.  DESCRIPTION OF OPERATION:  The patient was brought to the operating room and placed in supine position on the operating table and general endotracheal anesthesia was induced.  The abdomen was widely and sterilely prepped and draped.  She received preoperative IV antibiotics. Correct patient and procedure were verified.  Access was obtained with a 5-mm OptiVu trocar in the left upper quadrant midclavicular line.  On initial entry, the tissue planes were well visualized but the camera was placed and I was in fatty tissue rather than the free peritoneal cavity. I felt I was beneath the omentum and carefully pulled back the  catheter and then entered the free peritoneal cavity.  Inspection showed some air beneath the lesser omentum just above the hepatic flexure.  There was no evidence of bleeding or bowel injury and I proceeded with placement plan to check that area at the end of the procedure.  The patient had been marked for an exit site in the left lower quadrant.  Site about 5 cm above and medial to this was chosen and under direct vision a 12-mm trocar was placed.  A left-sided [place] catheter was then oriented and introduced through the trocar and a Silastic ball passed just deep to the peritoneum and holding this in place.  The trocar was slowly backed out.  The Silastic ball was slightly up above the peritoneum.  At this point, I placed a second 5-mm trocar in the right lower quadrant and positioned the catheter with the Silastic ball completely beneath the peritoneum and the Teflon cuff above the peritoneum and rectus sheath. The distal catheter was positioned deep in the pelvis and lay naturally. Following this, the abdomen was desufflated.  The catheter was tunneled from the entrance point subcutaneously to the exit site and brought out with the cuff about a centimeter deep to the skin.  Following this, the abdomen was reinsufflated.  The catheter was in good position.  I then carefully examined the right upper quadrant.  The omentum was lifted and the  transverse hepatic flexure of the colon carefully examined and there was no evidence of bowel injury.  There was a very tiny hematoma at the base of the mesentery of the right colon which was adjacent to the duodenum, the duodenum could be clearly seen.  There was no evidence of any bowel injury again.  No evidence of bleeding.  Following this, the abdomen was desufflated and trocars removed.  The extension tubing was attached to the catheter.  Skin incisions were closed with running 5-0 nylon.  Dressings were applied.  The patient taken to  recovery in good condition.     Darene Lamer. Chameka Mcmullen, M.D.     [name]  D:  06/20/2010  T:  06/21/2010  Job:  [id number]  Electronically Signed by Excell Seltzer M.D. on 06/25/2010 01:39:02 PM

## 2010-07-24 NOTE — Discharge Summary (Signed)
  [name], [name]              ACCOUNT NO.:  [id number]  MEDICAL RECORD NO.:  [id number]  LOCATION:  6709                         FACILITY:  [facility]  PHYSICIAN:  Darene Lamer. Mikhia Dusek, M.D.DATE OF BIRTH:  [birth year]  DATE OF ADMISSION:  06/20/2010 DATE OF DISCHARGE:  06/21/2010                              DISCHARGE SUMMARY   DISCHARGE DIAGNOSIS:  End-stage renal disease.  OPERATIONS AND PROCEDURES:  Laparoscopic placement of [place] peritoneal dialysis catheter.  HISTORY OF PRESENT ILLNESS:  The patient is a 65 year old female with end-stage renal disease due to hypertension.  She is interested in peritoneal dialysis and has been through preoperative evaluation and training and is felt to be a good candidate.  She is electively brought to the hospital for placement of peritoneal dialysis catheter.  PAST MEDICAL HISTORY:  SURGERY:  Only significant for tubal ligation and fistula creation, medically treated for hypertension, end-stage renal disease.  MEDICATIONS:  Clonidine, Rena-Vite, Vivelle, multivitamins, Protonix.  ALLERGIES:  No known drug allergies.  Social history, family history, and review of systems; see detailed H and P.  PERTINENT PHYSICAL EXAM:  GENERAL:  She is 5 feet, weight 136 pounds. VITAL SIGNS:  Within normal limits. Physical exam essentially unremarkable with a functioning shunt present.  HOSPITAL COURSE:  The patient was admitted on the morning of procedure, underwent uneventful laparoscopic placement of a [place] peritoneal dialysis catheter.  Her postoperative course was unremarkable.  The catheter flushed and drained well.  She had minimal pain.  White count was 6.5, hemoglobin 13.2 the following day and abdomen was benign.  She underwent hemodialysis under the direction of the renal service and was discharged on June 21, 2010, with followup in the peritoneal dialysis training center.  Prescription for Vicodin was given.  The medications are  otherwise unchanged.    Darene Lamer. Shalaine Payson, M.D.    [name]  D:  07/16/2010  T:  07/16/2010  Job:  [id number]  Electronically Signed by Excell Seltzer M.D. on 07/24/2010 03:19:13 PM

## 2010-07-25 ENCOUNTER — Telehealth (INDEPENDENT_AMBULATORY_CARE_PROVIDER_SITE_OTHER): Payer: Self-pay | Admitting: General Surgery

## 2010-07-25 NOTE — Telephone Encounter (Signed)
Please contact Baldemar Friday nurse with [facility] dialysis calling with concerns on [name]. You placed a perotineal cath on 06-20-10 and the patient is having tremendous pain. I have scheduled her to come in on 7/27 for evaluation. [name] would like to speak with you for some advise. Thanks [name]

## 2010-07-27 ENCOUNTER — Ambulatory Visit
Admission: RE | Admit: 2010-07-27 | Discharge: 2010-07-27 | Disposition: A | Discharge status: Home / Self Care | Payer: Medicare Other | Source: Ambulatory Visit | Attending: General Surgery | Admitting: General Surgery

## 2010-07-27 ENCOUNTER — Ambulatory Visit (INDEPENDENT_AMBULATORY_CARE_PROVIDER_SITE_OTHER): Payer: Medicare Other | Admitting: General Surgery

## 2010-07-27 ENCOUNTER — Other Ambulatory Visit (INDEPENDENT_AMBULATORY_CARE_PROVIDER_SITE_OTHER): Payer: Self-pay

## 2010-07-27 ENCOUNTER — Encounter (INDEPENDENT_AMBULATORY_CARE_PROVIDER_SITE_OTHER): Payer: Self-pay | Admitting: General Surgery

## 2010-07-27 VITALS — BP 142/88 | HR 78 | Temp 97.4°F | Ht 59.5 in | Wt 133.8 lb

## 2010-07-27 DIAGNOSIS — Z992 Dependence on renal dialysis: Secondary | ICD-10-CM

## 2010-07-27 NOTE — Patient Instructions (Signed)
Will call with the results of x-ray next week. We can decide then whether to remove the catheter or not. Please call for any questions.

## 2010-07-27 NOTE — Progress Notes (Signed)
This patient returns 5 weeks following uneventful laparoscopic placement of a peritoneal dialysis catheter. She receives her care at the [facility]. She unfortunately has experienced extreme abdominal pain with her fills. She describes severe crampy lower abdominal pain radiating into her rectum and perineum as the volume of fluid increases. This has been intolerable for her and she is returning to hemodialysis. She wants her catheter removed. The fluid apparently in stools and drains without difficulty. She states she's had the fluid checked for infection and is negative and it is not cloudy.  On examination today her abdomen is soft and minimally if at all tender. Exit site looks fine and her incisions look fine.  Assessment and plan: Abdominal pain with fluid instillation post peritoneal dialysis catheter. No other evidence of infection normal chemical problems. I'm going to obtain a KUB to check catheter placement. If it appears in good position I do not really have any other surgical options for her. If it is out of position we could consider repositioning laparoscopically. I suggested to her that we might want to wait 6 or 8 weeks and retry dialysis as things will be more healed at that time but she feels strongly that she wants the catheter removed. I asked her to think about this for the next few days while we get the KUB results and I will call her at that time. If the catheter is in good position and she still wants it removed we will do that for her.

## 2010-07-29 ENCOUNTER — Emergency Department (HOSPITAL_COMMUNITY)
Admission: EM | Admit: 2010-07-29 | Discharge: 2010-07-29 | Disposition: A | Discharge status: Home / Self Care | Payer: Medicare Other | Attending: Emergency Medicine | Admitting: Emergency Medicine

## 2010-07-29 DIAGNOSIS — E785 Hyperlipidemia, unspecified: Secondary | ICD-10-CM | POA: Insufficient documentation

## 2010-07-29 DIAGNOSIS — I120 Hypertensive chronic kidney disease with stage 5 chronic kidney disease or end stage renal disease: Secondary | ICD-10-CM | POA: Insufficient documentation

## 2010-07-29 DIAGNOSIS — N949 Unspecified condition associated with female genital organs and menstrual cycle: Secondary | ICD-10-CM | POA: Insufficient documentation

## 2010-07-29 DIAGNOSIS — R109 Unspecified abdominal pain: Secondary | ICD-10-CM | POA: Insufficient documentation

## 2010-07-29 DIAGNOSIS — R1110 Vomiting, unspecified: Secondary | ICD-10-CM | POA: Insufficient documentation

## 2010-07-29 DIAGNOSIS — Z992 Dependence on renal dialysis: Secondary | ICD-10-CM | POA: Insufficient documentation

## 2010-07-29 DIAGNOSIS — N186 End stage renal disease: Secondary | ICD-10-CM | POA: Insufficient documentation

## 2010-07-29 LAB — COMPREHENSIVE METABOLIC PANEL
Alkaline Phosphatase: 70 U/L (ref 39–117)
BUN: 35 mg/dL — ABNORMAL HIGH (ref 6–23)
CO2: 28 mEq/L (ref 19–32)
Chloride: 91 mEq/L — ABNORMAL LOW (ref 96–112)
Creatinine, Ser: 13.01 mg/dL — ABNORMAL HIGH (ref 0.50–1.10)
GFR calc Af Amer: 3 mL/min — ABNORMAL LOW (ref >60)
GFR calc non Af Amer: 3 mL/min — ABNORMAL LOW (ref >60)
Glucose, Bld: 97 mg/dL (ref 70–99)
Potassium: 5 mEq/L (ref 3.5–5.1)
Total Bilirubin: 0.2 mg/dL — ABNORMAL LOW (ref 0.3–1.2)

## 2010-07-29 LAB — CBC
HCT: 34.7 % — ABNORMAL LOW (ref 36.0–46.0)
Hemoglobin: 12 g/dL (ref 12.0–15.0)
MCV: 96.1 fL (ref 78.0–100.0)
RBC: 3.61 MIL/uL — ABNORMAL LOW (ref 3.87–5.11)
RDW: 12.9 % (ref 11.5–15.5)
WBC: 9 10*3/uL (ref 4.0–10.5)

## 2010-07-29 LAB — DIFFERENTIAL
Basophils Absolute: 0 10*3/uL (ref 0.0–0.1)
Eosinophils Relative: 1 % (ref 0–5)
Lymphocytes Relative: 22 % (ref 12–46)
Lymphs Abs: 2 10*3/uL (ref 0.7–4.0)
Neutro Abs: 6.3 10*3/uL (ref 1.7–7.7)

## 2010-07-30 ENCOUNTER — Telehealth (INDEPENDENT_AMBULATORY_CARE_PROVIDER_SITE_OTHER): Payer: Self-pay | Admitting: General Surgery

## 2010-07-30 NOTE — Telephone Encounter (Signed)
Talked to pt, she was in ER over weekend with pelvic pain. X-ray shows good position of the PD catheter. She wants it removed.and is firm about this. I told her I would talk to Dr Florene Glen then schedule removal.

## 2010-08-01 NOTE — Telephone Encounter (Signed)
Discussed removal of PD cath

## 2010-08-06 ENCOUNTER — Telehealth (INDEPENDENT_AMBULATORY_CARE_PROVIDER_SITE_OTHER): Payer: Self-pay

## 2010-08-06 NOTE — Progress Notes (Signed)
Pt. Called and aware of results.  She would like to have PD Catheter removed, Dr. Excell Seltzer paged to discuss this with him.

## 2010-08-07 ENCOUNTER — Emergency Department (HOSPITAL_COMMUNITY): Payer: Medicare Other

## 2010-08-07 ENCOUNTER — Encounter (HOSPITAL_COMMUNITY): Payer: Self-pay

## 2010-08-07 ENCOUNTER — Emergency Department (HOSPITAL_COMMUNITY)
Admission: EM | Admit: 2010-08-07 | Discharge: 2010-08-07 | Disposition: A | Discharge status: Home / Self Care | Payer: Medicare Other | Attending: Emergency Medicine | Admitting: Emergency Medicine

## 2010-08-07 ENCOUNTER — Emergency Department (HOSPITAL_COMMUNITY)
Admit: 2010-08-07 | Discharge: 2010-08-07 | Disposition: A | Discharge status: Home / Self Care | Payer: Medicare Other | Attending: Nephrology | Admitting: Nephrology

## 2010-08-07 DIAGNOSIS — K5900 Constipation, unspecified: Secondary | ICD-10-CM | POA: Insufficient documentation

## 2010-08-07 DIAGNOSIS — E875 Hyperkalemia: Secondary | ICD-10-CM | POA: Insufficient documentation

## 2010-08-07 DIAGNOSIS — E785 Hyperlipidemia, unspecified: Secondary | ICD-10-CM | POA: Insufficient documentation

## 2010-08-07 DIAGNOSIS — Z79899 Other long term (current) drug therapy: Secondary | ICD-10-CM | POA: Insufficient documentation

## 2010-08-07 DIAGNOSIS — K3189 Other diseases of stomach and duodenum: Secondary | ICD-10-CM | POA: Insufficient documentation

## 2010-08-07 DIAGNOSIS — R109 Unspecified abdominal pain: Secondary | ICD-10-CM | POA: Insufficient documentation

## 2010-08-07 DIAGNOSIS — R112 Nausea with vomiting, unspecified: Secondary | ICD-10-CM | POA: Insufficient documentation

## 2010-08-07 DIAGNOSIS — I120 Hypertensive chronic kidney disease with stage 5 chronic kidney disease or end stage renal disease: Secondary | ICD-10-CM | POA: Insufficient documentation

## 2010-08-07 DIAGNOSIS — R10819 Abdominal tenderness, unspecified site: Secondary | ICD-10-CM | POA: Insufficient documentation

## 2010-08-07 DIAGNOSIS — N186 End stage renal disease: Secondary | ICD-10-CM | POA: Insufficient documentation

## 2010-08-07 HISTORY — DX: Tubal ligation status: Z98.51

## 2010-08-07 HISTORY — DX: Disorder of kidney and ureter, unspecified: N28.9

## 2010-08-07 HISTORY — DX: Essential (primary) hypertension: I10

## 2010-08-07 LAB — POTASSIUM
Potassium: 7.1 mEq/L (ref 3.5–5.1)
Potassium: 6.7 mEq/L (ref 3.5–5.1)
Potassium: 3.1 mEq/L — ABNORMAL LOW (ref 3.5–5.1)

## 2010-08-07 LAB — DIFFERENTIAL
Basophils Absolute: 0.1 10*3/uL (ref 0.0–0.1)
Basophils Relative: 1 % (ref 0–1)
Eosinophils Absolute: 0.4 10*3/uL (ref 0.0–0.7)
Neutro Abs: 3.7 10*3/uL (ref 1.7–7.7)
Neutrophils Relative %: 50 % (ref 43–77)

## 2010-08-07 LAB — COMPREHENSIVE METABOLIC PANEL
AST: 14 U/L (ref 0–37)
Albumin: 3.3 g/dL — ABNORMAL LOW (ref 3.5–5.2)
Alkaline Phosphatase: 68 U/L (ref 39–117)
Chloride: 86 mEq/L — ABNORMAL LOW (ref 96–112)
Potassium: 6.3 mEq/L (ref 3.5–5.1)
Sodium: 130 mEq/L — ABNORMAL LOW (ref 135–145)
Total Bilirubin: 0.2 mg/dL — ABNORMAL LOW (ref 0.3–1.2)

## 2010-08-07 LAB — CBC
Platelets: 310 10*3/uL (ref 150–400)
RDW: 13 % (ref 11.5–15.5)
WBC: 7.4 10*3/uL (ref 4.0–10.5)

## 2010-08-07 MED ORDER — IOHEXOL 300 MG/ML  SOLN
80.0000 mL | Freq: Once | INTRAMUSCULAR | Status: AC | PRN
  Administered 2010-08-07: 80 mL via INTRAVENOUS

## 2010-08-09 ENCOUNTER — Ambulatory Visit (HOSPITAL_COMMUNITY)
Admission: RE | Admit: 2010-08-09 | Discharge: 2010-08-09 | Disposition: A | Discharge status: Home / Self Care | Payer: Medicare Other | Source: Ambulatory Visit | Attending: General Surgery | Admitting: General Surgery

## 2010-08-09 DIAGNOSIS — T82898A Other specified complication of vascular prosthetic devices, implants and grafts, initial encounter: Secondary | ICD-10-CM | POA: Insufficient documentation

## 2010-08-09 DIAGNOSIS — N186 End stage renal disease: Secondary | ICD-10-CM | POA: Insufficient documentation

## 2010-08-09 DIAGNOSIS — Z992 Dependence on renal dialysis: Secondary | ICD-10-CM | POA: Insufficient documentation

## 2010-08-09 DIAGNOSIS — I120 Hypertensive chronic kidney disease with stage 5 chronic kidney disease or end stage renal disease: Secondary | ICD-10-CM | POA: Insufficient documentation

## 2010-08-09 DIAGNOSIS — Y838 Other surgical procedures as the cause of abnormal reaction of the patient, or of later complication, without mention of misadventure at the time of the procedure: Secondary | ICD-10-CM | POA: Insufficient documentation

## 2010-08-09 DIAGNOSIS — Z4902 Encounter for fitting and adjustment of peritoneal dialysis catheter: Secondary | ICD-10-CM

## 2010-08-09 HISTORY — PX: PERITONEAL CATHETER REMOVAL: SUR1106

## 2010-08-09 LAB — SURGICAL PCR SCREEN: Staphylococcus aureus: NEGATIVE

## 2010-08-09 LAB — POCT I-STAT 4, (NA,K, GLUC, HGB,HCT)
Hemoglobin: 10.9 g/dL — ABNORMAL LOW (ref 12.0–15.0)
Sodium: 134 mEq/L — ABNORMAL LOW (ref 135–145)

## 2010-08-09 NOTE — Telephone Encounter (Signed)
Called patient, left message to call our office

## 2010-08-10 ENCOUNTER — Encounter (INDEPENDENT_AMBULATORY_CARE_PROVIDER_SITE_OTHER): Payer: Self-pay | Admitting: General Surgery

## 2010-08-20 NOTE — Op Note (Signed)
NAMESOFIAGRACE, PELICAN              ACCOUNT NO.:  [id number]  MEDICAL RECORD NO.:  [id number]  LOCATION:  SDSC                         FACILITY:  [facility]  PHYSICIAN:  Marland Kitchen T. Shaina Gullatt, M.D.DATE OF BIRTH:  02-06-1945  DATE OF PROCEDURE:  08/09/2010 DATE OF DISCHARGE:                              OPERATIVE REPORT   PREOPERATIVE DIAGNOSIS:  Intolerance of peritoneal dialysis.  POSTOPERATIVE DIAGNOSIS:  Intolerance of peritoneal dialysis.  SURGICAL PROCEDURES:  Removal of peritoneal dialysis catheter.  SURGEON:  Darene Lamer. Senai Kingsley, MD  ANESTHESIA:  Laryngeal mask general.  BRIEF HISTORY:  [name] is a 65 year old female with end-stage renal disease who underwent placement of peritoneal dialysis catheter several weeks ago.  The procedure was uncomplicated.  However, with institution of peritoneal dialysis, the patient has had severe perineal pain and abdominal pressure and discomfort with fills.  She feels that she is not able to tolerate these symptoms.  Workup has shown no evidence of peritonitis and the catheter functions well and is in good position by x- ray.  We discussed options with the patient and she firmly wants the catheter removed.  We discussed risks of bleeding, infection, anesthetic complications, and she was brought to the operating room for this procedure.  DESCRIPTION OF OPERATION:  The patient was brought to the operating room and placed in supine position on the operating table and laryngeal mask general anesthesia was induced.  The abdomen was widely sterilely prepped and draped.  She received preoperative antibiotics.  Correct patient and procedure were verified.  The previous small trocar site incision in the left midabdomen was used.  Dissection was carried down through the subcutaneous tissue onto the catheter.  The catheter was dissected free from the deep subcutaneous tissue.  I divided the catheter at this point and the distal end was elevated  and sharp dissection carried down along the catheter down toward the Dacron cuff beneath the fascia.  The fascia was incised for a small distance medial and lateral, and using a blunt and cautery dissection, the Dacron cuff was completely dissected out of the rectus sheath intact.  Peritoneum was incised slightly and the Silastic ball freed and the distal coiled tip of the catheter removed intact.  Hemostasis was obtained with cautery.  The soft tissue was infiltrated with Marcaine.  The small fascial defect was closed with a couple of interrupted 2-0 Prolene sutures.  The subcu was closed with interrupted 4-0 Monocryl and the skin with subcuticular 4-0 Monocryl and Dermabond.  At this point, the exit site was enlarged just slightly and blunt dissection carried down along the catheter.  The Dacron cuff was identified and grasped, and using blunt dissection, it was completely dissected out of the subcutaneous tissue intact and the remainder of the catheter removed from the exit site. Hemostasis here was obtained with cautery.  The soft tissue again was infiltrated with Marcaine and antibiotic ointment and dry gauze dressing applied here.  Sponge and instrument counts correct.  The patient was taken to the recovery in good condition.     Darene Lamer. Gerlean Cid, M.D.     [name]  D:  08/09/2010  T:  08/09/2010  Job:  [id number]  Electronically  Signed by Excell Seltzer M.D. on 08/20/2010 04:41:43 PM

## 2010-09-07 ENCOUNTER — Encounter (INDEPENDENT_AMBULATORY_CARE_PROVIDER_SITE_OTHER): Payer: Medicare Other | Admitting: General Surgery

## 2010-09-24 ENCOUNTER — Emergency Department (HOSPITAL_COMMUNITY)
Admission: EM | Admit: 2010-09-24 | Discharge: 2010-09-24 | Disposition: A | Discharge status: Home / Self Care | Payer: Medicare Other | Attending: Emergency Medicine | Admitting: Emergency Medicine

## 2010-09-24 ENCOUNTER — Emergency Department (HOSPITAL_COMMUNITY): Payer: Medicare Other

## 2010-09-24 DIAGNOSIS — M79609 Pain in unspecified limb: Secondary | ICD-10-CM | POA: Insufficient documentation

## 2010-09-24 DIAGNOSIS — S40029A Contusion of unspecified upper arm, initial encounter: Secondary | ICD-10-CM | POA: Insufficient documentation

## 2010-09-24 DIAGNOSIS — W07XXXA Fall from chair, initial encounter: Secondary | ICD-10-CM | POA: Insufficient documentation

## 2010-09-24 DIAGNOSIS — Z79899 Other long term (current) drug therapy: Secondary | ICD-10-CM | POA: Insufficient documentation

## 2010-09-24 DIAGNOSIS — M25519 Pain in unspecified shoulder: Secondary | ICD-10-CM | POA: Insufficient documentation

## 2010-09-24 DIAGNOSIS — I129 Hypertensive chronic kidney disease with stage 1 through stage 4 chronic kidney disease, or unspecified chronic kidney disease: Secondary | ICD-10-CM | POA: Insufficient documentation

## 2010-09-24 DIAGNOSIS — Z992 Dependence on renal dialysis: Secondary | ICD-10-CM | POA: Insufficient documentation

## 2010-09-24 DIAGNOSIS — N189 Chronic kidney disease, unspecified: Secondary | ICD-10-CM | POA: Insufficient documentation

## 2010-09-24 DIAGNOSIS — E785 Hyperlipidemia, unspecified: Secondary | ICD-10-CM | POA: Insufficient documentation

## 2010-09-24 DIAGNOSIS — Y92009 Unspecified place in unspecified non-institutional (private) residence as the place of occurrence of the external cause: Secondary | ICD-10-CM | POA: Insufficient documentation

## 2010-11-07 DIAGNOSIS — I10 Essential (primary) hypertension: Secondary | ICD-10-CM | POA: Diagnosis present

## 2011-01-13 ENCOUNTER — Encounter (HOSPITAL_COMMUNITY): Payer: Self-pay

## 2011-01-13 ENCOUNTER — Emergency Department (HOSPITAL_COMMUNITY)
Admission: EM | Admit: 2011-01-13 | Discharge: 2011-01-13 | Disposition: A | Discharge status: Home / Self Care | Payer: Medicare Other | Attending: Emergency Medicine | Admitting: Emergency Medicine

## 2011-01-13 DIAGNOSIS — J3489 Other specified disorders of nose and nasal sinuses: Secondary | ICD-10-CM | POA: Insufficient documentation

## 2011-01-13 DIAGNOSIS — Z992 Dependence on renal dialysis: Secondary | ICD-10-CM | POA: Insufficient documentation

## 2011-01-13 DIAGNOSIS — N186 End stage renal disease: Secondary | ICD-10-CM | POA: Insufficient documentation

## 2011-01-13 DIAGNOSIS — R05 Cough: Secondary | ICD-10-CM | POA: Insufficient documentation

## 2011-01-13 DIAGNOSIS — I120 Hypertensive chronic kidney disease with stage 5 chronic kidney disease or end stage renal disease: Secondary | ICD-10-CM | POA: Insufficient documentation

## 2011-01-13 DIAGNOSIS — J069 Acute upper respiratory infection, unspecified: Secondary | ICD-10-CM | POA: Insufficient documentation

## 2011-01-13 DIAGNOSIS — R059 Cough, unspecified: Secondary | ICD-10-CM | POA: Insufficient documentation

## 2011-01-13 DIAGNOSIS — R6889 Other general symptoms and signs: Secondary | ICD-10-CM | POA: Insufficient documentation

## 2011-01-13 DIAGNOSIS — IMO0001 Reserved for inherently not codable concepts without codable children: Secondary | ICD-10-CM | POA: Insufficient documentation

## 2011-01-13 MED ORDER — FLUTICASONE PROPIONATE 50 MCG/ACT NA SUSP: 2.0000 | Freq: Every day | NASAL | Status: DC

## 2011-01-13 NOTE — ED Provider Notes (Signed)
History    this is a 65 year old female presenting to the ED with chief complaints of flulike symptoms. Patient states for the past several days she has been having sneezing, itchy eyes, nasal congestions, dry cough, and mild body aches. She notices increase nasal drainage at nighttime. To recall her son who has similar symptoms. She has tried Mucinex without relief. She denies fever, headache, throat swelling, hearing changes, chest pain, or shortness of breath, or rash. She denies nausea, vomiting, diarrhea, or abdominal pain. She is a dialysis patient, Tuesday/Thursday/Saturday. She has had her flu shot for this year.  CSN: [id number]  Arrival date & time 01/13/11  1811   First MD Initiated Contact with Patient 01/13/11 2109      Chief Complaint  Patient presents with  . Nasal Congestion  . URI    (Consider location/radiation/quality/duration/timing/severity/associated sxs/prior treatment) HPI  Past Medical History  Diagnosis Date  . Renal insufficiency   . Hypertension   . Hx of tubal ligation t    Past Surgical History  Procedure Date  . Peritoneal catheter insertion   . Peritoneal catheter removal     No family history on file.  History  Substance Use Topics  . Smoking status: Never Smoker   . Smokeless tobacco: Not on file  . Alcohol Use: No    OB History    Grav Para Term Preterm Abortions TAB SAB Ect Mult Living                  Review of Systems  All other systems reviewed and are negative.    Allergies  Review of patient's allergies indicates no known allergies.  Home Medications   Current Outpatient Rx  Name Route Sig Dispense Refill  . CLONIDINE HCL 0.1 MG PO TABS Oral Take 0.1 mg by mouth 3 (three) times daily.      Marland Kitchen ESTRADIOL 0.025 MG/24HR TD PTTW Transdermal Place 1 patch onto the skin 2 (two) times a week.      Marland Kitchen PRESCRIPTION MEDICATION Oral Take 0.5 tablets by mouth daily. Nor syndrome hormonal tablet     . SEVELAMER CARBONATE 800 MG PO  TABS Oral Take 1,600 mg by mouth 3 (three) times daily with meals.        BP 127/79  Pulse 93  Temp(Src) 98.8 F (37.1 C) (Oral)  Resp 18  SpO2 97%  Physical Exam  Nursing note and vitals reviewed. Constitutional: She appears well-developed and well-nourished. No distress.       Awake, alert, nontoxic appearance  HENT:  Head: Normocephalic and atraumatic.  Right Ear: Hearing normal.  Left Ear: Hearing and tympanic membrane normal.  Ears:  Nose: Rhinorrhea present. No nasal deformity or septal deviation.  Mouth/Throat: Uvula is midline, oropharynx is clear and moist and mucous membranes are normal.  Eyes: Right eye exhibits no discharge. Left eye exhibits no discharge.  Neck: Neck supple.  Cardiovascular: Regular rhythm.   Pulmonary/Chest: Effort normal and breath sounds normal. She exhibits no tenderness.  Abdominal: There is no tenderness. There is no rebound.  Musculoskeletal: She exhibits no tenderness.       Baseline ROM, no obvious new focal weakness  Neurological:       Mental status and motor strength appears baseline for patient and situation  Skin: No rash noted.  Psychiatric: She has a normal mood and affect.    ED Course  Procedures (including critical care time)  Labs Reviewed - No data to display No results found.  No diagnosis found.    MDM  Patient presents with URI symptoms. Doubt influenza. Doubt bacterial infection. Doubt pneumonia. Reassurance given. I advised the patient to return if she has fever, nausea, vomiting, diarrhea, or chest discomfort. Patient voiced understanding and agreed with plan. I will prescribe Flonase.   Medical screening examination/treatment/procedure(s) were performed by non-physician practitioner and as supervising physician I was immediately available for consultation/collaboration. Katy Apo, M.D.      Domenic Moras, PA 01/13/11 2130  [place], MD 01/14/11 1254

## 2011-01-13 NOTE — ED Notes (Signed)
Pt stated that she came in because there has been an increase in sneezing, blowing of nose, sinus pressure and pain since sneezing.  Pt stated that she took Mucinex for it but it did not work.  No SOB, lung sounds clear. Will continue to monitor.

## 2011-01-13 NOTE — ED Notes (Signed)
Pt discharged home. Pts discharge instructions were read and given to pt. Pt educated about prescriptions, follow-up care, and diagnosis (per instructions).  Pt has no questions or concerns.

## 2011-01-13 NOTE — ED Notes (Signed)
[place], PA at bedside 

## 2011-01-13 NOTE — ED Notes (Signed)
Doctor at bedside.

## 2011-01-13 NOTE — ED Notes (Signed)
Pt reports that cold like symptoms began on 12/25.   Nasal congestion has continued and now concerned there is an infection.

## 2011-01-17 DIAGNOSIS — D631 Anemia in chronic kidney disease: Secondary | ICD-10-CM | POA: Diagnosis not present

## 2011-01-17 DIAGNOSIS — N2581 Secondary hyperparathyroidism of renal origin: Secondary | ICD-10-CM | POA: Diagnosis not present

## 2011-01-17 DIAGNOSIS — N186 End stage renal disease: Secondary | ICD-10-CM | POA: Diagnosis not present

## 2011-01-17 DIAGNOSIS — N039 Chronic nephritic syndrome with unspecified morphologic changes: Secondary | ICD-10-CM | POA: Diagnosis not present

## 2011-01-19 DIAGNOSIS — N2581 Secondary hyperparathyroidism of renal origin: Secondary | ICD-10-CM | POA: Diagnosis not present

## 2011-01-19 DIAGNOSIS — N186 End stage renal disease: Secondary | ICD-10-CM | POA: Diagnosis not present

## 2011-01-19 DIAGNOSIS — D631 Anemia in chronic kidney disease: Secondary | ICD-10-CM | POA: Diagnosis not present

## 2011-01-22 DIAGNOSIS — D631 Anemia in chronic kidney disease: Secondary | ICD-10-CM | POA: Diagnosis not present

## 2011-01-22 DIAGNOSIS — N186 End stage renal disease: Secondary | ICD-10-CM | POA: Diagnosis not present

## 2011-01-22 DIAGNOSIS — N2581 Secondary hyperparathyroidism of renal origin: Secondary | ICD-10-CM | POA: Diagnosis not present

## 2011-01-24 DIAGNOSIS — N186 End stage renal disease: Secondary | ICD-10-CM | POA: Diagnosis not present

## 2011-01-24 DIAGNOSIS — N039 Chronic nephritic syndrome with unspecified morphologic changes: Secondary | ICD-10-CM | POA: Diagnosis not present

## 2011-01-24 DIAGNOSIS — N2581 Secondary hyperparathyroidism of renal origin: Secondary | ICD-10-CM | POA: Diagnosis not present

## 2011-01-26 DIAGNOSIS — N039 Chronic nephritic syndrome with unspecified morphologic changes: Secondary | ICD-10-CM | POA: Diagnosis not present

## 2011-01-26 DIAGNOSIS — N2581 Secondary hyperparathyroidism of renal origin: Secondary | ICD-10-CM | POA: Diagnosis not present

## 2011-01-26 DIAGNOSIS — N186 End stage renal disease: Secondary | ICD-10-CM | POA: Diagnosis not present

## 2011-01-29 DIAGNOSIS — N2581 Secondary hyperparathyroidism of renal origin: Secondary | ICD-10-CM | POA: Diagnosis not present

## 2011-01-29 DIAGNOSIS — N186 End stage renal disease: Secondary | ICD-10-CM | POA: Diagnosis not present

## 2011-01-29 DIAGNOSIS — N039 Chronic nephritic syndrome with unspecified morphologic changes: Secondary | ICD-10-CM | POA: Diagnosis not present

## 2011-01-31 DIAGNOSIS — N186 End stage renal disease: Secondary | ICD-10-CM | POA: Diagnosis not present

## 2011-01-31 DIAGNOSIS — N039 Chronic nephritic syndrome with unspecified morphologic changes: Secondary | ICD-10-CM | POA: Diagnosis not present

## 2011-01-31 DIAGNOSIS — N2581 Secondary hyperparathyroidism of renal origin: Secondary | ICD-10-CM | POA: Diagnosis not present

## 2011-02-01 ENCOUNTER — Other Ambulatory Visit: Payer: Self-pay | Admitting: Family Medicine

## 2011-02-01 ENCOUNTER — Other Ambulatory Visit (HOSPITAL_COMMUNITY)
Admission: RE | Admit: 2011-02-01 | Discharge: 2011-02-01 | Disposition: A | Discharge status: Home / Self Care | Payer: Medicare Other | Source: Ambulatory Visit | Attending: Family Medicine | Admitting: Family Medicine

## 2011-02-01 DIAGNOSIS — I10 Essential (primary) hypertension: Secondary | ICD-10-CM | POA: Diagnosis not present

## 2011-02-01 DIAGNOSIS — Z124 Encounter for screening for malignant neoplasm of cervix: Secondary | ICD-10-CM | POA: Insufficient documentation

## 2011-02-01 DIAGNOSIS — E785 Hyperlipidemia, unspecified: Secondary | ICD-10-CM | POA: Diagnosis not present

## 2011-02-01 DIAGNOSIS — H6990 Unspecified Eustachian tube disorder, unspecified ear: Secondary | ICD-10-CM | POA: Diagnosis not present

## 2011-02-02 DIAGNOSIS — N186 End stage renal disease: Secondary | ICD-10-CM | POA: Diagnosis not present

## 2011-02-02 DIAGNOSIS — N2581 Secondary hyperparathyroidism of renal origin: Secondary | ICD-10-CM | POA: Diagnosis not present

## 2011-02-02 DIAGNOSIS — N039 Chronic nephritic syndrome with unspecified morphologic changes: Secondary | ICD-10-CM | POA: Diagnosis not present

## 2011-02-05 DIAGNOSIS — N2581 Secondary hyperparathyroidism of renal origin: Secondary | ICD-10-CM | POA: Diagnosis not present

## 2011-02-05 DIAGNOSIS — N039 Chronic nephritic syndrome with unspecified morphologic changes: Secondary | ICD-10-CM | POA: Diagnosis not present

## 2011-02-05 DIAGNOSIS — N186 End stage renal disease: Secondary | ICD-10-CM | POA: Diagnosis not present

## 2011-02-07 DIAGNOSIS — N186 End stage renal disease: Secondary | ICD-10-CM | POA: Diagnosis not present

## 2011-02-07 DIAGNOSIS — N2581 Secondary hyperparathyroidism of renal origin: Secondary | ICD-10-CM | POA: Diagnosis not present

## 2011-02-07 DIAGNOSIS — D631 Anemia in chronic kidney disease: Secondary | ICD-10-CM | POA: Diagnosis not present

## 2011-02-09 DIAGNOSIS — N186 End stage renal disease: Secondary | ICD-10-CM | POA: Diagnosis not present

## 2011-02-09 DIAGNOSIS — N2581 Secondary hyperparathyroidism of renal origin: Secondary | ICD-10-CM | POA: Diagnosis not present

## 2011-02-09 DIAGNOSIS — N039 Chronic nephritic syndrome with unspecified morphologic changes: Secondary | ICD-10-CM | POA: Diagnosis not present

## 2011-02-12 DIAGNOSIS — D631 Anemia in chronic kidney disease: Secondary | ICD-10-CM | POA: Diagnosis not present

## 2011-02-12 DIAGNOSIS — N186 End stage renal disease: Secondary | ICD-10-CM | POA: Diagnosis not present

## 2011-02-12 DIAGNOSIS — N2581 Secondary hyperparathyroidism of renal origin: Secondary | ICD-10-CM | POA: Diagnosis not present

## 2011-02-14 DIAGNOSIS — D631 Anemia in chronic kidney disease: Secondary | ICD-10-CM | POA: Diagnosis not present

## 2011-02-14 DIAGNOSIS — N2581 Secondary hyperparathyroidism of renal origin: Secondary | ICD-10-CM | POA: Diagnosis not present

## 2011-02-14 DIAGNOSIS — N186 End stage renal disease: Secondary | ICD-10-CM | POA: Diagnosis not present

## 2011-02-16 DIAGNOSIS — N186 End stage renal disease: Secondary | ICD-10-CM | POA: Diagnosis not present

## 2011-02-16 DIAGNOSIS — Z8249 Family history of ischemic heart disease and other diseases of the circulatory system: Secondary | ICD-10-CM | POA: Diagnosis not present

## 2011-02-16 DIAGNOSIS — N250 Renal osteodystrophy: Secondary | ICD-10-CM | POA: Diagnosis present

## 2011-02-16 DIAGNOSIS — Z01818 Encounter for other preprocedural examination: Secondary | ICD-10-CM | POA: Diagnosis not present

## 2011-02-16 DIAGNOSIS — Z940 Kidney transplant status: Secondary | ICD-10-CM | POA: Diagnosis not present

## 2011-02-16 DIAGNOSIS — Z79899 Other long term (current) drug therapy: Secondary | ICD-10-CM | POA: Diagnosis not present

## 2011-02-16 DIAGNOSIS — I10 Essential (primary) hypertension: Secondary | ICD-10-CM | POA: Diagnosis not present

## 2011-02-16 DIAGNOSIS — D72829 Elevated white blood cell count, unspecified: Secondary | ICD-10-CM | POA: Diagnosis not present

## 2011-02-16 DIAGNOSIS — J811 Chronic pulmonary edema: Secondary | ICD-10-CM | POA: Diagnosis present

## 2011-02-16 DIAGNOSIS — IMO0002 Reserved for concepts with insufficient information to code with codable children: Secondary | ICD-10-CM | POA: Diagnosis not present

## 2011-02-16 DIAGNOSIS — D649 Anemia, unspecified: Secondary | ICD-10-CM | POA: Diagnosis not present

## 2011-02-16 DIAGNOSIS — I120 Hypertensive chronic kidney disease with stage 5 chronic kidney disease or end stage renal disease: Secondary | ICD-10-CM | POA: Diagnosis not present

## 2011-02-16 DIAGNOSIS — Z7682 Awaiting organ transplant status: Secondary | ICD-10-CM | POA: Diagnosis not present

## 2011-02-16 DIAGNOSIS — I4460 Unspecified fascicular block: Secondary | ICD-10-CM | POA: Diagnosis not present

## 2011-02-16 DIAGNOSIS — R000 Tachycardia, unspecified: Secondary | ICD-10-CM | POA: Diagnosis not present

## 2011-02-16 DIAGNOSIS — Z87440 Personal history of urinary (tract) infections: Secondary | ICD-10-CM | POA: Diagnosis not present

## 2011-02-16 DIAGNOSIS — Z9851 Tubal ligation status: Secondary | ICD-10-CM | POA: Diagnosis not present

## 2011-02-16 DIAGNOSIS — N2581 Secondary hyperparathyroidism of renal origin: Secondary | ICD-10-CM | POA: Diagnosis present

## 2011-02-16 DIAGNOSIS — Z8042 Family history of malignant neoplasm of prostate: Secondary | ICD-10-CM | POA: Diagnosis not present

## 2011-02-16 DIAGNOSIS — Z452 Encounter for adjustment and management of vascular access device: Secondary | ICD-10-CM | POA: Diagnosis not present

## 2011-02-16 DIAGNOSIS — D899 Disorder involving the immune mechanism, unspecified: Secondary | ICD-10-CM | POA: Diagnosis not present

## 2011-02-17 DIAGNOSIS — Z01818 Encounter for other preprocedural examination: Secondary | ICD-10-CM | POA: Diagnosis not present

## 2011-02-17 DIAGNOSIS — N186 End stage renal disease: Secondary | ICD-10-CM | POA: Diagnosis not present

## 2011-02-17 DIAGNOSIS — I120 Hypertensive chronic kidney disease with stage 5 chronic kidney disease or end stage renal disease: Secondary | ICD-10-CM | POA: Diagnosis not present

## 2011-02-17 DIAGNOSIS — D649 Anemia, unspecified: Secondary | ICD-10-CM | POA: Diagnosis not present

## 2011-02-17 DIAGNOSIS — Z940 Kidney transplant status: Secondary | ICD-10-CM

## 2011-02-17 HISTORY — PX: KIDNEY TRANSPLANT: SHX239

## 2011-02-17 HISTORY — DX: Kidney transplant status: Z94.0

## 2011-02-18 DIAGNOSIS — Z940 Kidney transplant status: Secondary | ICD-10-CM | POA: Diagnosis not present

## 2011-02-18 DIAGNOSIS — N186 End stage renal disease: Secondary | ICD-10-CM | POA: Diagnosis not present

## 2011-02-25 DIAGNOSIS — N185 Chronic kidney disease, stage 5: Secondary | ICD-10-CM | POA: Diagnosis not present

## 2011-02-25 DIAGNOSIS — Z940 Kidney transplant status: Secondary | ICD-10-CM | POA: Diagnosis not present

## 2011-02-25 DIAGNOSIS — I120 Hypertensive chronic kidney disease with stage 5 chronic kidney disease or end stage renal disease: Secondary | ICD-10-CM | POA: Diagnosis not present

## 2011-02-25 DIAGNOSIS — I10 Essential (primary) hypertension: Secondary | ICD-10-CM | POA: Diagnosis not present

## 2011-02-25 DIAGNOSIS — Z79899 Other long term (current) drug therapy: Secondary | ICD-10-CM | POA: Diagnosis not present

## 2011-02-25 DIAGNOSIS — D649 Anemia, unspecified: Secondary | ICD-10-CM | POA: Diagnosis not present

## 2011-02-28 DIAGNOSIS — Z79899 Other long term (current) drug therapy: Secondary | ICD-10-CM | POA: Diagnosis not present

## 2011-02-28 DIAGNOSIS — R609 Edema, unspecified: Secondary | ICD-10-CM | POA: Diagnosis not present

## 2011-02-28 DIAGNOSIS — I10 Essential (primary) hypertension: Secondary | ICD-10-CM | POA: Diagnosis not present

## 2011-02-28 DIAGNOSIS — Z940 Kidney transplant status: Secondary | ICD-10-CM | POA: Diagnosis not present

## 2011-02-28 DIAGNOSIS — D649 Anemia, unspecified: Secondary | ICD-10-CM | POA: Diagnosis not present

## 2011-02-28 DIAGNOSIS — I808 Phlebitis and thrombophlebitis of other sites: Secondary | ICD-10-CM | POA: Diagnosis not present

## 2011-03-04 DIAGNOSIS — I10 Essential (primary) hypertension: Secondary | ICD-10-CM | POA: Diagnosis not present

## 2011-03-04 DIAGNOSIS — Z940 Kidney transplant status: Secondary | ICD-10-CM | POA: Diagnosis not present

## 2011-03-04 DIAGNOSIS — Z79899 Other long term (current) drug therapy: Secondary | ICD-10-CM | POA: Diagnosis not present

## 2011-03-04 DIAGNOSIS — D649 Anemia, unspecified: Secondary | ICD-10-CM | POA: Diagnosis not present

## 2011-03-04 DIAGNOSIS — R609 Edema, unspecified: Secondary | ICD-10-CM | POA: Diagnosis not present

## 2011-03-07 DIAGNOSIS — I10 Essential (primary) hypertension: Secondary | ICD-10-CM | POA: Diagnosis not present

## 2011-03-07 DIAGNOSIS — D649 Anemia, unspecified: Secondary | ICD-10-CM | POA: Diagnosis not present

## 2011-03-07 DIAGNOSIS — D72829 Elevated white blood cell count, unspecified: Secondary | ICD-10-CM | POA: Diagnosis not present

## 2011-03-07 DIAGNOSIS — Z940 Kidney transplant status: Secondary | ICD-10-CM | POA: Diagnosis not present

## 2011-03-07 DIAGNOSIS — Z79899 Other long term (current) drug therapy: Secondary | ICD-10-CM | POA: Diagnosis not present

## 2011-03-11 DIAGNOSIS — Z48298 Encounter for aftercare following other organ transplant: Secondary | ICD-10-CM | POA: Diagnosis not present

## 2011-03-11 DIAGNOSIS — Z940 Kidney transplant status: Secondary | ICD-10-CM | POA: Diagnosis not present

## 2011-03-11 DIAGNOSIS — Z79899 Other long term (current) drug therapy: Secondary | ICD-10-CM | POA: Diagnosis not present

## 2011-03-14 DIAGNOSIS — E139 Other specified diabetes mellitus without complications: Secondary | ICD-10-CM | POA: Diagnosis not present

## 2011-03-14 DIAGNOSIS — Z940 Kidney transplant status: Secondary | ICD-10-CM | POA: Diagnosis not present

## 2011-03-14 DIAGNOSIS — D649 Anemia, unspecified: Secondary | ICD-10-CM | POA: Diagnosis not present

## 2011-03-14 DIAGNOSIS — Z79899 Other long term (current) drug therapy: Secondary | ICD-10-CM | POA: Diagnosis not present

## 2011-03-14 DIAGNOSIS — N186 End stage renal disease: Secondary | ICD-10-CM | POA: Diagnosis not present

## 2011-03-14 DIAGNOSIS — D72829 Elevated white blood cell count, unspecified: Secondary | ICD-10-CM | POA: Diagnosis not present

## 2011-03-18 DIAGNOSIS — N186 End stage renal disease: Secondary | ICD-10-CM | POA: Diagnosis not present

## 2011-03-18 DIAGNOSIS — N170 Acute kidney failure with tubular necrosis: Secondary | ICD-10-CM | POA: Diagnosis not present

## 2011-03-18 DIAGNOSIS — IMO0002 Reserved for concepts with insufficient information to code with codable children: Secondary | ICD-10-CM | POA: Diagnosis not present

## 2011-03-18 DIAGNOSIS — T8610 Unspecified complication of kidney transplant: Secondary | ICD-10-CM | POA: Diagnosis not present

## 2011-03-18 DIAGNOSIS — Z79899 Other long term (current) drug therapy: Secondary | ICD-10-CM | POA: Diagnosis not present

## 2011-03-18 DIAGNOSIS — I120 Hypertensive chronic kidney disease with stage 5 chronic kidney disease or end stage renal disease: Secondary | ICD-10-CM | POA: Diagnosis not present

## 2011-03-18 DIAGNOSIS — Z940 Kidney transplant status: Secondary | ICD-10-CM | POA: Diagnosis not present

## 2011-03-18 DIAGNOSIS — Z48298 Encounter for aftercare following other organ transplant: Secondary | ICD-10-CM | POA: Diagnosis not present

## 2011-03-25 DIAGNOSIS — I10 Essential (primary) hypertension: Secondary | ICD-10-CM | POA: Diagnosis not present

## 2011-03-25 DIAGNOSIS — E139 Other specified diabetes mellitus without complications: Secondary | ICD-10-CM | POA: Diagnosis not present

## 2011-03-25 DIAGNOSIS — Z940 Kidney transplant status: Secondary | ICD-10-CM | POA: Diagnosis not present

## 2011-03-25 DIAGNOSIS — Z79899 Other long term (current) drug therapy: Secondary | ICD-10-CM | POA: Diagnosis not present

## 2011-03-28 DIAGNOSIS — Z79899 Other long term (current) drug therapy: Secondary | ICD-10-CM | POA: Diagnosis not present

## 2011-03-28 DIAGNOSIS — E139 Other specified diabetes mellitus without complications: Secondary | ICD-10-CM | POA: Diagnosis not present

## 2011-03-28 DIAGNOSIS — J3489 Other specified disorders of nose and nasal sinuses: Secondary | ICD-10-CM | POA: Diagnosis not present

## 2011-03-28 DIAGNOSIS — I10 Essential (primary) hypertension: Secondary | ICD-10-CM | POA: Diagnosis not present

## 2011-03-28 DIAGNOSIS — Z940 Kidney transplant status: Secondary | ICD-10-CM | POA: Diagnosis not present

## 2011-04-02 DIAGNOSIS — Z940 Kidney transplant status: Secondary | ICD-10-CM | POA: Diagnosis not present

## 2011-04-02 DIAGNOSIS — Z79899 Other long term (current) drug therapy: Secondary | ICD-10-CM | POA: Diagnosis not present

## 2011-04-02 DIAGNOSIS — I10 Essential (primary) hypertension: Secondary | ICD-10-CM | POA: Diagnosis not present

## 2011-04-02 DIAGNOSIS — Z48298 Encounter for aftercare following other organ transplant: Secondary | ICD-10-CM | POA: Diagnosis not present

## 2011-04-02 DIAGNOSIS — E139 Other specified diabetes mellitus without complications: Secondary | ICD-10-CM | POA: Diagnosis not present

## 2011-04-09 DIAGNOSIS — D649 Anemia, unspecified: Secondary | ICD-10-CM | POA: Diagnosis not present

## 2011-04-09 DIAGNOSIS — Z940 Kidney transplant status: Secondary | ICD-10-CM | POA: Diagnosis not present

## 2011-04-09 DIAGNOSIS — E139 Other specified diabetes mellitus without complications: Secondary | ICD-10-CM | POA: Diagnosis not present

## 2011-04-09 DIAGNOSIS — Z79899 Other long term (current) drug therapy: Secondary | ICD-10-CM | POA: Diagnosis not present

## 2011-04-09 DIAGNOSIS — I10 Essential (primary) hypertension: Secondary | ICD-10-CM | POA: Diagnosis not present

## 2011-04-15 DIAGNOSIS — E139 Other specified diabetes mellitus without complications: Secondary | ICD-10-CM | POA: Diagnosis not present

## 2011-04-15 DIAGNOSIS — I10 Essential (primary) hypertension: Secondary | ICD-10-CM | POA: Diagnosis not present

## 2011-04-15 DIAGNOSIS — Z940 Kidney transplant status: Secondary | ICD-10-CM | POA: Diagnosis not present

## 2011-04-15 DIAGNOSIS — Z79899 Other long term (current) drug therapy: Secondary | ICD-10-CM | POA: Diagnosis not present

## 2011-04-15 DIAGNOSIS — D649 Anemia, unspecified: Secondary | ICD-10-CM | POA: Diagnosis not present

## 2011-04-25 DIAGNOSIS — I10 Essential (primary) hypertension: Secondary | ICD-10-CM | POA: Diagnosis not present

## 2011-04-25 DIAGNOSIS — Z940 Kidney transplant status: Secondary | ICD-10-CM | POA: Diagnosis not present

## 2011-04-25 DIAGNOSIS — Z79899 Other long term (current) drug therapy: Secondary | ICD-10-CM | POA: Diagnosis not present

## 2011-04-25 DIAGNOSIS — D649 Anemia, unspecified: Secondary | ICD-10-CM | POA: Diagnosis not present

## 2011-04-25 DIAGNOSIS — E139 Other specified diabetes mellitus without complications: Secondary | ICD-10-CM | POA: Diagnosis not present

## 2011-04-25 DIAGNOSIS — M25519 Pain in unspecified shoulder: Secondary | ICD-10-CM | POA: Diagnosis not present

## 2011-05-01 DIAGNOSIS — I10 Essential (primary) hypertension: Secondary | ICD-10-CM | POA: Diagnosis not present

## 2011-05-01 DIAGNOSIS — Z940 Kidney transplant status: Secondary | ICD-10-CM | POA: Diagnosis not present

## 2011-05-01 DIAGNOSIS — Z48298 Encounter for aftercare following other organ transplant: Secondary | ICD-10-CM | POA: Diagnosis not present

## 2011-05-01 DIAGNOSIS — E139 Other specified diabetes mellitus without complications: Secondary | ICD-10-CM | POA: Diagnosis not present

## 2011-05-01 DIAGNOSIS — Z79899 Other long term (current) drug therapy: Secondary | ICD-10-CM | POA: Diagnosis not present

## 2011-05-01 DIAGNOSIS — D649 Anemia, unspecified: Secondary | ICD-10-CM | POA: Diagnosis not present

## 2011-05-01 DIAGNOSIS — M25519 Pain in unspecified shoulder: Secondary | ICD-10-CM | POA: Diagnosis not present

## 2011-05-09 DIAGNOSIS — M549 Dorsalgia, unspecified: Secondary | ICD-10-CM | POA: Diagnosis not present

## 2011-05-09 DIAGNOSIS — M542 Cervicalgia: Secondary | ICD-10-CM | POA: Diagnosis not present

## 2011-05-10 DIAGNOSIS — Z940 Kidney transplant status: Secondary | ICD-10-CM | POA: Diagnosis not present

## 2011-05-13 DIAGNOSIS — E119 Type 2 diabetes mellitus without complications: Secondary | ICD-10-CM | POA: Diagnosis not present

## 2011-05-13 DIAGNOSIS — Z79899 Other long term (current) drug therapy: Secondary | ICD-10-CM | POA: Diagnosis not present

## 2011-05-13 DIAGNOSIS — Z940 Kidney transplant status: Secondary | ICD-10-CM | POA: Diagnosis not present

## 2011-05-19 DIAGNOSIS — Z940 Kidney transplant status: Secondary | ICD-10-CM | POA: Diagnosis not present

## 2011-05-19 DIAGNOSIS — R1110 Vomiting, unspecified: Secondary | ICD-10-CM | POA: Diagnosis not present

## 2011-05-19 DIAGNOSIS — E876 Hypokalemia: Secondary | ICD-10-CM | POA: Diagnosis not present

## 2011-05-19 DIAGNOSIS — IMO0001 Reserved for inherently not codable concepts without codable children: Secondary | ICD-10-CM | POA: Diagnosis not present

## 2011-05-19 DIAGNOSIS — R197 Diarrhea, unspecified: Secondary | ICD-10-CM | POA: Diagnosis not present

## 2011-05-19 DIAGNOSIS — M542 Cervicalgia: Secondary | ICD-10-CM | POA: Diagnosis not present

## 2011-05-19 DIAGNOSIS — R799 Abnormal finding of blood chemistry, unspecified: Secondary | ICD-10-CM | POA: Diagnosis not present

## 2011-05-22 DIAGNOSIS — Z79899 Other long term (current) drug therapy: Secondary | ICD-10-CM | POA: Diagnosis not present

## 2011-05-22 DIAGNOSIS — E876 Hypokalemia: Secondary | ICD-10-CM | POA: Diagnosis not present

## 2011-05-22 DIAGNOSIS — Z940 Kidney transplant status: Secondary | ICD-10-CM | POA: Diagnosis not present

## 2011-05-28 DIAGNOSIS — E119 Type 2 diabetes mellitus without complications: Secondary | ICD-10-CM | POA: Diagnosis not present

## 2011-05-28 DIAGNOSIS — Z79899 Other long term (current) drug therapy: Secondary | ICD-10-CM | POA: Diagnosis not present

## 2011-05-28 DIAGNOSIS — Z940 Kidney transplant status: Secondary | ICD-10-CM | POA: Diagnosis not present

## 2011-06-11 DIAGNOSIS — Z79899 Other long term (current) drug therapy: Secondary | ICD-10-CM | POA: Diagnosis not present

## 2011-06-11 DIAGNOSIS — Z940 Kidney transplant status: Secondary | ICD-10-CM | POA: Diagnosis not present

## 2011-06-14 DIAGNOSIS — Z940 Kidney transplant status: Secondary | ICD-10-CM | POA: Diagnosis not present

## 2011-06-16 DIAGNOSIS — E119 Type 2 diabetes mellitus without complications: Secondary | ICD-10-CM | POA: Diagnosis not present

## 2011-06-16 DIAGNOSIS — N390 Urinary tract infection, site not specified: Secondary | ICD-10-CM | POA: Diagnosis not present

## 2011-06-16 DIAGNOSIS — Z940 Kidney transplant status: Secondary | ICD-10-CM | POA: Diagnosis not present

## 2011-06-24 DIAGNOSIS — Z79899 Other long term (current) drug therapy: Secondary | ICD-10-CM | POA: Diagnosis not present

## 2011-06-24 DIAGNOSIS — E876 Hypokalemia: Secondary | ICD-10-CM | POA: Diagnosis not present

## 2011-06-24 DIAGNOSIS — D649 Anemia, unspecified: Secondary | ICD-10-CM | POA: Diagnosis not present

## 2011-06-24 DIAGNOSIS — Z940 Kidney transplant status: Secondary | ICD-10-CM | POA: Diagnosis not present

## 2011-06-24 DIAGNOSIS — E119 Type 2 diabetes mellitus without complications: Secondary | ICD-10-CM | POA: Diagnosis not present

## 2011-07-02 DIAGNOSIS — Z940 Kidney transplant status: Secondary | ICD-10-CM | POA: Diagnosis not present

## 2011-07-02 DIAGNOSIS — Z79899 Other long term (current) drug therapy: Secondary | ICD-10-CM | POA: Diagnosis not present

## 2011-07-02 DIAGNOSIS — E119 Type 2 diabetes mellitus without complications: Secondary | ICD-10-CM | POA: Diagnosis not present

## 2011-07-08 DIAGNOSIS — M25519 Pain in unspecified shoulder: Secondary | ICD-10-CM | POA: Diagnosis not present

## 2011-07-08 DIAGNOSIS — Z79899 Other long term (current) drug therapy: Secondary | ICD-10-CM | POA: Diagnosis not present

## 2011-07-08 DIAGNOSIS — D649 Anemia, unspecified: Secondary | ICD-10-CM | POA: Diagnosis not present

## 2011-07-08 DIAGNOSIS — E139 Other specified diabetes mellitus without complications: Secondary | ICD-10-CM | POA: Diagnosis not present

## 2011-07-08 DIAGNOSIS — T889XXS Complication of surgical and medical care, unspecified, sequela: Secondary | ICD-10-CM | POA: Diagnosis not present

## 2011-07-08 DIAGNOSIS — D899 Disorder involving the immune mechanism, unspecified: Secondary | ICD-10-CM | POA: Diagnosis not present

## 2011-07-08 DIAGNOSIS — N186 End stage renal disease: Secondary | ICD-10-CM | POA: Diagnosis not present

## 2011-07-08 DIAGNOSIS — I10 Essential (primary) hypertension: Secondary | ICD-10-CM | POA: Diagnosis not present

## 2011-07-08 DIAGNOSIS — I120 Hypertensive chronic kidney disease with stage 5 chronic kidney disease or end stage renal disease: Secondary | ICD-10-CM | POA: Diagnosis not present

## 2011-07-08 DIAGNOSIS — M542 Cervicalgia: Secondary | ICD-10-CM | POA: Diagnosis not present

## 2011-07-08 DIAGNOSIS — M549 Dorsalgia, unspecified: Secondary | ICD-10-CM | POA: Diagnosis not present

## 2011-07-08 DIAGNOSIS — Z940 Kidney transplant status: Secondary | ICD-10-CM | POA: Diagnosis not present

## 2011-07-15 DIAGNOSIS — Z940 Kidney transplant status: Secondary | ICD-10-CM | POA: Diagnosis not present

## 2011-07-15 DIAGNOSIS — Z48298 Encounter for aftercare following other organ transplant: Secondary | ICD-10-CM | POA: Diagnosis not present

## 2011-07-23 DIAGNOSIS — Z79899 Other long term (current) drug therapy: Secondary | ICD-10-CM | POA: Diagnosis not present

## 2011-07-23 DIAGNOSIS — I10 Essential (primary) hypertension: Secondary | ICD-10-CM | POA: Diagnosis not present

## 2011-07-23 DIAGNOSIS — I120 Hypertensive chronic kidney disease with stage 5 chronic kidney disease or end stage renal disease: Secondary | ICD-10-CM | POA: Diagnosis not present

## 2011-07-23 DIAGNOSIS — E139 Other specified diabetes mellitus without complications: Secondary | ICD-10-CM | POA: Diagnosis not present

## 2011-07-23 DIAGNOSIS — M549 Dorsalgia, unspecified: Secondary | ICD-10-CM | POA: Diagnosis not present

## 2011-07-23 DIAGNOSIS — D649 Anemia, unspecified: Secondary | ICD-10-CM | POA: Diagnosis not present

## 2011-07-23 DIAGNOSIS — Z940 Kidney transplant status: Secondary | ICD-10-CM | POA: Diagnosis not present

## 2011-07-23 DIAGNOSIS — B259 Cytomegaloviral disease, unspecified: Secondary | ICD-10-CM | POA: Diagnosis not present

## 2011-07-23 DIAGNOSIS — T8610 Unspecified complication of kidney transplant: Secondary | ICD-10-CM | POA: Diagnosis not present

## 2011-07-23 DIAGNOSIS — D899 Disorder involving the immune mechanism, unspecified: Secondary | ICD-10-CM | POA: Diagnosis not present

## 2011-07-23 DIAGNOSIS — N186 End stage renal disease: Secondary | ICD-10-CM | POA: Diagnosis not present

## 2011-07-24 DIAGNOSIS — Z940 Kidney transplant status: Secondary | ICD-10-CM | POA: Diagnosis not present

## 2011-07-24 DIAGNOSIS — R350 Frequency of micturition: Secondary | ICD-10-CM | POA: Diagnosis not present

## 2011-07-31 DIAGNOSIS — Z48298 Encounter for aftercare following other organ transplant: Secondary | ICD-10-CM | POA: Diagnosis not present

## 2011-07-31 DIAGNOSIS — Z940 Kidney transplant status: Secondary | ICD-10-CM | POA: Diagnosis not present

## 2011-08-02 DIAGNOSIS — M25519 Pain in unspecified shoulder: Secondary | ICD-10-CM | POA: Diagnosis not present

## 2011-08-02 DIAGNOSIS — R350 Frequency of micturition: Secondary | ICD-10-CM | POA: Diagnosis not present

## 2011-08-02 DIAGNOSIS — E785 Hyperlipidemia, unspecified: Secondary | ICD-10-CM | POA: Diagnosis not present

## 2011-08-02 DIAGNOSIS — N186 End stage renal disease: Secondary | ICD-10-CM | POA: Diagnosis not present

## 2011-08-02 DIAGNOSIS — E1129 Type 2 diabetes mellitus with other diabetic kidney complication: Secondary | ICD-10-CM | POA: Diagnosis not present

## 2011-08-02 DIAGNOSIS — N390 Urinary tract infection, site not specified: Secondary | ICD-10-CM | POA: Diagnosis not present

## 2011-08-05 DIAGNOSIS — B259 Cytomegaloviral disease, unspecified: Secondary | ICD-10-CM | POA: Diagnosis not present

## 2011-08-05 DIAGNOSIS — Z940 Kidney transplant status: Secondary | ICD-10-CM | POA: Diagnosis not present

## 2011-08-05 DIAGNOSIS — Z48298 Encounter for aftercare following other organ transplant: Secondary | ICD-10-CM | POA: Diagnosis not present

## 2011-08-21 DIAGNOSIS — Z940 Kidney transplant status: Secondary | ICD-10-CM | POA: Diagnosis not present

## 2011-08-21 DIAGNOSIS — Z48298 Encounter for aftercare following other organ transplant: Secondary | ICD-10-CM | POA: Diagnosis not present

## 2011-09-05 DIAGNOSIS — E139 Other specified diabetes mellitus without complications: Secondary | ICD-10-CM | POA: Diagnosis not present

## 2011-09-05 DIAGNOSIS — M549 Dorsalgia, unspecified: Secondary | ICD-10-CM | POA: Diagnosis not present

## 2011-09-05 DIAGNOSIS — N390 Urinary tract infection, site not specified: Secondary | ICD-10-CM | POA: Diagnosis not present

## 2011-09-05 DIAGNOSIS — B259 Cytomegaloviral disease, unspecified: Secondary | ICD-10-CM | POA: Diagnosis not present

## 2011-09-05 DIAGNOSIS — D649 Anemia, unspecified: Secondary | ICD-10-CM | POA: Diagnosis not present

## 2011-09-05 DIAGNOSIS — Z940 Kidney transplant status: Secondary | ICD-10-CM | POA: Diagnosis not present

## 2011-09-05 DIAGNOSIS — M25519 Pain in unspecified shoulder: Secondary | ICD-10-CM | POA: Diagnosis not present

## 2011-09-05 DIAGNOSIS — M542 Cervicalgia: Secondary | ICD-10-CM | POA: Diagnosis not present

## 2011-09-05 DIAGNOSIS — Z79899 Other long term (current) drug therapy: Secondary | ICD-10-CM | POA: Diagnosis not present

## 2011-09-05 DIAGNOSIS — I10 Essential (primary) hypertension: Secondary | ICD-10-CM | POA: Diagnosis not present

## 2011-09-17 DIAGNOSIS — M25519 Pain in unspecified shoulder: Secondary | ICD-10-CM | POA: Diagnosis not present

## 2011-09-17 DIAGNOSIS — N390 Urinary tract infection, site not specified: Secondary | ICD-10-CM | POA: Diagnosis not present

## 2011-09-17 DIAGNOSIS — Z940 Kidney transplant status: Secondary | ICD-10-CM | POA: Diagnosis not present

## 2011-09-17 DIAGNOSIS — Z48298 Encounter for aftercare following other organ transplant: Secondary | ICD-10-CM | POA: Diagnosis not present

## 2011-09-17 DIAGNOSIS — M549 Dorsalgia, unspecified: Secondary | ICD-10-CM | POA: Diagnosis not present

## 2011-09-17 DIAGNOSIS — E139 Other specified diabetes mellitus without complications: Secondary | ICD-10-CM | POA: Diagnosis not present

## 2011-09-17 DIAGNOSIS — I10 Essential (primary) hypertension: Secondary | ICD-10-CM | POA: Diagnosis not present

## 2011-09-17 DIAGNOSIS — M542 Cervicalgia: Secondary | ICD-10-CM | POA: Diagnosis not present

## 2011-09-17 DIAGNOSIS — B259 Cytomegaloviral disease, unspecified: Secondary | ICD-10-CM | POA: Diagnosis not present

## 2011-09-17 DIAGNOSIS — Z79899 Other long term (current) drug therapy: Secondary | ICD-10-CM | POA: Diagnosis not present

## 2011-09-23 DIAGNOSIS — Z940 Kidney transplant status: Secondary | ICD-10-CM | POA: Diagnosis not present

## 2011-09-23 DIAGNOSIS — Z48298 Encounter for aftercare following other organ transplant: Secondary | ICD-10-CM | POA: Diagnosis not present

## 2011-10-01 DIAGNOSIS — H18419 Arcus senilis, unspecified eye: Secondary | ICD-10-CM | POA: Diagnosis not present

## 2011-10-01 DIAGNOSIS — H11829 Conjunctivochalasis, unspecified eye: Secondary | ICD-10-CM | POA: Diagnosis not present

## 2011-10-07 DIAGNOSIS — Z940 Kidney transplant status: Secondary | ICD-10-CM | POA: Diagnosis not present

## 2011-10-21 DIAGNOSIS — N390 Urinary tract infection, site not specified: Secondary | ICD-10-CM | POA: Diagnosis not present

## 2011-10-21 DIAGNOSIS — Z79899 Other long term (current) drug therapy: Secondary | ICD-10-CM | POA: Diagnosis not present

## 2011-10-21 DIAGNOSIS — Z940 Kidney transplant status: Secondary | ICD-10-CM | POA: Diagnosis not present

## 2011-10-22 DIAGNOSIS — N390 Urinary tract infection, site not specified: Secondary | ICD-10-CM | POA: Diagnosis not present

## 2011-11-18 DIAGNOSIS — Z940 Kidney transplant status: Secondary | ICD-10-CM | POA: Diagnosis not present

## 2011-11-18 DIAGNOSIS — Z79899 Other long term (current) drug therapy: Secondary | ICD-10-CM | POA: Diagnosis not present

## 2011-11-29 DIAGNOSIS — H9319 Tinnitus, unspecified ear: Secondary | ICD-10-CM | POA: Diagnosis not present

## 2011-11-29 DIAGNOSIS — H6120 Impacted cerumen, unspecified ear: Secondary | ICD-10-CM | POA: Diagnosis not present

## 2011-12-17 DIAGNOSIS — Z79899 Other long term (current) drug therapy: Secondary | ICD-10-CM | POA: Diagnosis not present

## 2011-12-17 DIAGNOSIS — Z940 Kidney transplant status: Secondary | ICD-10-CM | POA: Diagnosis not present

## 2011-12-17 DIAGNOSIS — E119 Type 2 diabetes mellitus without complications: Secondary | ICD-10-CM | POA: Diagnosis not present

## 2011-12-18 DIAGNOSIS — B349 Viral infection, unspecified: Secondary | ICD-10-CM | POA: Diagnosis not present

## 2011-12-18 DIAGNOSIS — Z940 Kidney transplant status: Secondary | ICD-10-CM | POA: Diagnosis not present

## 2011-12-18 DIAGNOSIS — Z23 Encounter for immunization: Secondary | ICD-10-CM | POA: Diagnosis not present

## 2011-12-26 DIAGNOSIS — Z940 Kidney transplant status: Secondary | ICD-10-CM | POA: Diagnosis not present

## 2012-01-20 DIAGNOSIS — Z940 Kidney transplant status: Secondary | ICD-10-CM | POA: Diagnosis not present

## 2012-01-30 DIAGNOSIS — N181 Chronic kidney disease, stage 1: Secondary | ICD-10-CM | POA: Diagnosis not present

## 2012-01-30 DIAGNOSIS — Z940 Kidney transplant status: Secondary | ICD-10-CM | POA: Diagnosis not present

## 2012-01-31 DIAGNOSIS — E1129 Type 2 diabetes mellitus with other diabetic kidney complication: Secondary | ICD-10-CM | POA: Diagnosis not present

## 2012-01-31 DIAGNOSIS — Z79899 Other long term (current) drug therapy: Secondary | ICD-10-CM | POA: Diagnosis not present

## 2012-01-31 DIAGNOSIS — H1130 Conjunctival hemorrhage, unspecified eye: Secondary | ICD-10-CM | POA: Diagnosis not present

## 2012-01-31 DIAGNOSIS — N185 Chronic kidney disease, stage 5: Secondary | ICD-10-CM | POA: Diagnosis not present

## 2012-01-31 DIAGNOSIS — E785 Hyperlipidemia, unspecified: Secondary | ICD-10-CM | POA: Diagnosis not present

## 2012-02-18 DIAGNOSIS — D649 Anemia, unspecified: Secondary | ICD-10-CM | POA: Diagnosis not present

## 2012-02-18 DIAGNOSIS — Z940 Kidney transplant status: Secondary | ICD-10-CM | POA: Diagnosis not present

## 2012-02-18 DIAGNOSIS — I10 Essential (primary) hypertension: Secondary | ICD-10-CM | POA: Diagnosis not present

## 2012-02-18 DIAGNOSIS — Z79899 Other long term (current) drug therapy: Secondary | ICD-10-CM | POA: Diagnosis not present

## 2012-02-18 DIAGNOSIS — Z48298 Encounter for aftercare following other organ transplant: Secondary | ICD-10-CM | POA: Diagnosis not present

## 2012-03-10 DIAGNOSIS — N390 Urinary tract infection, site not specified: Secondary | ICD-10-CM | POA: Diagnosis not present

## 2012-03-28 DIAGNOSIS — R079 Chest pain, unspecified: Secondary | ICD-10-CM | POA: Diagnosis not present

## 2012-03-28 DIAGNOSIS — I4460 Unspecified fascicular block: Secondary | ICD-10-CM | POA: Diagnosis not present

## 2012-03-31 ENCOUNTER — Other Ambulatory Visit: Payer: Self-pay

## 2012-04-14 DIAGNOSIS — Z940 Kidney transplant status: Secondary | ICD-10-CM | POA: Diagnosis not present

## 2012-04-14 DIAGNOSIS — Z79899 Other long term (current) drug therapy: Secondary | ICD-10-CM | POA: Diagnosis not present

## 2012-04-17 DIAGNOSIS — N181 Chronic kidney disease, stage 1: Secondary | ICD-10-CM | POA: Diagnosis not present

## 2012-04-23 DIAGNOSIS — R300 Dysuria: Secondary | ICD-10-CM | POA: Diagnosis not present

## 2012-04-29 ENCOUNTER — Ambulatory Visit
Admission: RE | Admit: 2012-04-29 | Discharge: 2012-04-29 | Disposition: A | Discharge status: Home / Self Care | Payer: Medicare Other | Source: Ambulatory Visit

## 2012-04-29 DIAGNOSIS — Z1231 Encounter for screening mammogram for malignant neoplasm of breast: Secondary | ICD-10-CM

## 2012-05-16 DIAGNOSIS — Z79899 Other long term (current) drug therapy: Secondary | ICD-10-CM | POA: Diagnosis not present

## 2012-05-16 DIAGNOSIS — N390 Urinary tract infection, site not specified: Secondary | ICD-10-CM | POA: Diagnosis not present

## 2012-05-16 DIAGNOSIS — Z940 Kidney transplant status: Secondary | ICD-10-CM | POA: Diagnosis not present

## 2012-05-16 DIAGNOSIS — E119 Type 2 diabetes mellitus without complications: Secondary | ICD-10-CM | POA: Diagnosis not present

## 2012-05-19 DIAGNOSIS — E119 Type 2 diabetes mellitus without complications: Secondary | ICD-10-CM | POA: Diagnosis not present

## 2012-05-19 DIAGNOSIS — Z940 Kidney transplant status: Secondary | ICD-10-CM | POA: Diagnosis not present

## 2012-05-19 DIAGNOSIS — Z79899 Other long term (current) drug therapy: Secondary | ICD-10-CM | POA: Diagnosis not present

## 2012-05-30 DIAGNOSIS — E119 Type 2 diabetes mellitus without complications: Secondary | ICD-10-CM | POA: Diagnosis not present

## 2012-06-11 DIAGNOSIS — M67919 Unspecified disorder of synovium and tendon, unspecified shoulder: Secondary | ICD-10-CM | POA: Diagnosis not present

## 2012-06-11 DIAGNOSIS — M7520 Bicipital tendinitis, unspecified shoulder: Secondary | ICD-10-CM | POA: Diagnosis not present

## 2012-06-16 DIAGNOSIS — Z79899 Other long term (current) drug therapy: Secondary | ICD-10-CM | POA: Diagnosis not present

## 2012-06-16 DIAGNOSIS — Z940 Kidney transplant status: Secondary | ICD-10-CM | POA: Diagnosis not present

## 2012-06-22 DIAGNOSIS — M25619 Stiffness of unspecified shoulder, not elsewhere classified: Secondary | ICD-10-CM | POA: Diagnosis not present

## 2012-06-22 DIAGNOSIS — M67919 Unspecified disorder of synovium and tendon, unspecified shoulder: Secondary | ICD-10-CM | POA: Diagnosis not present

## 2012-06-22 DIAGNOSIS — M25519 Pain in unspecified shoulder: Secondary | ICD-10-CM | POA: Diagnosis not present

## 2012-06-30 DIAGNOSIS — H2510 Age-related nuclear cataract, unspecified eye: Secondary | ICD-10-CM | POA: Diagnosis not present

## 2012-06-30 DIAGNOSIS — E119 Type 2 diabetes mellitus without complications: Secondary | ICD-10-CM | POA: Diagnosis not present

## 2012-07-15 DIAGNOSIS — N181 Chronic kidney disease, stage 1: Secondary | ICD-10-CM | POA: Diagnosis not present

## 2012-07-15 DIAGNOSIS — Z940 Kidney transplant status: Secondary | ICD-10-CM | POA: Diagnosis not present

## 2012-07-24 DIAGNOSIS — Z79899 Other long term (current) drug therapy: Secondary | ICD-10-CM | POA: Diagnosis not present

## 2012-07-24 DIAGNOSIS — Z940 Kidney transplant status: Secondary | ICD-10-CM | POA: Diagnosis not present

## 2012-07-29 DIAGNOSIS — E119 Type 2 diabetes mellitus without complications: Secondary | ICD-10-CM | POA: Diagnosis not present

## 2012-07-29 DIAGNOSIS — N390 Urinary tract infection, site not specified: Secondary | ICD-10-CM | POA: Diagnosis not present

## 2012-07-29 DIAGNOSIS — Z940 Kidney transplant status: Secondary | ICD-10-CM | POA: Diagnosis not present

## 2012-08-15 DIAGNOSIS — R350 Frequency of micturition: Secondary | ICD-10-CM | POA: Diagnosis not present

## 2012-08-15 DIAGNOSIS — R109 Unspecified abdominal pain: Secondary | ICD-10-CM | POA: Diagnosis not present

## 2012-08-15 DIAGNOSIS — N390 Urinary tract infection, site not specified: Secondary | ICD-10-CM | POA: Diagnosis not present

## 2012-08-24 DIAGNOSIS — Z79899 Other long term (current) drug therapy: Secondary | ICD-10-CM | POA: Diagnosis not present

## 2012-08-24 DIAGNOSIS — Z940 Kidney transplant status: Secondary | ICD-10-CM | POA: Diagnosis not present

## 2012-08-25 DIAGNOSIS — G2589 Other specified extrapyramidal and movement disorders: Secondary | ICD-10-CM | POA: Diagnosis not present

## 2012-08-25 DIAGNOSIS — N3000 Acute cystitis without hematuria: Secondary | ICD-10-CM | POA: Diagnosis not present

## 2012-09-22 DIAGNOSIS — Z0000 Encounter for general adult medical examination without abnormal findings: Secondary | ICD-10-CM | POA: Diagnosis not present

## 2012-09-22 DIAGNOSIS — E1129 Type 2 diabetes mellitus with other diabetic kidney complication: Secondary | ICD-10-CM | POA: Diagnosis not present

## 2012-09-22 DIAGNOSIS — Z79899 Other long term (current) drug therapy: Secondary | ICD-10-CM | POA: Diagnosis not present

## 2012-09-22 DIAGNOSIS — E785 Hyperlipidemia, unspecified: Secondary | ICD-10-CM | POA: Diagnosis not present

## 2012-09-22 DIAGNOSIS — N185 Chronic kidney disease, stage 5: Secondary | ICD-10-CM | POA: Diagnosis not present

## 2012-10-05 DIAGNOSIS — Z940 Kidney transplant status: Secondary | ICD-10-CM | POA: Diagnosis not present

## 2012-10-05 DIAGNOSIS — R109 Unspecified abdominal pain: Secondary | ICD-10-CM | POA: Diagnosis not present

## 2012-10-05 DIAGNOSIS — R300 Dysuria: Secondary | ICD-10-CM | POA: Diagnosis not present

## 2012-10-05 DIAGNOSIS — R350 Frequency of micturition: Secondary | ICD-10-CM | POA: Diagnosis not present

## 2012-10-05 DIAGNOSIS — Z79899 Other long term (current) drug therapy: Secondary | ICD-10-CM | POA: Diagnosis not present

## 2012-10-05 DIAGNOSIS — Z5320 Procedure and treatment not carried out because of patient's decision for unspecified reasons: Secondary | ICD-10-CM | POA: Diagnosis not present

## 2012-10-06 DIAGNOSIS — N390 Urinary tract infection, site not specified: Secondary | ICD-10-CM | POA: Diagnosis not present

## 2012-10-14 DIAGNOSIS — Z940 Kidney transplant status: Secondary | ICD-10-CM | POA: Diagnosis not present

## 2012-10-14 DIAGNOSIS — N181 Chronic kidney disease, stage 1: Secondary | ICD-10-CM | POA: Diagnosis not present

## 2012-10-14 DIAGNOSIS — Z23 Encounter for immunization: Secondary | ICD-10-CM | POA: Diagnosis not present

## 2012-10-22 DIAGNOSIS — Z780 Asymptomatic menopausal state: Secondary | ICD-10-CM | POA: Diagnosis not present

## 2012-10-22 DIAGNOSIS — IMO0002 Reserved for concepts with insufficient information to code with codable children: Secondary | ICD-10-CM | POA: Diagnosis not present

## 2012-10-26 DIAGNOSIS — E119 Type 2 diabetes mellitus without complications: Secondary | ICD-10-CM | POA: Diagnosis not present

## 2012-10-26 DIAGNOSIS — Z79899 Other long term (current) drug therapy: Secondary | ICD-10-CM | POA: Diagnosis not present

## 2012-10-26 DIAGNOSIS — Z940 Kidney transplant status: Secondary | ICD-10-CM | POA: Diagnosis not present

## 2012-11-13 DIAGNOSIS — I10 Essential (primary) hypertension: Secondary | ICD-10-CM | POA: Diagnosis not present

## 2012-11-13 DIAGNOSIS — Z0000 Encounter for general adult medical examination without abnormal findings: Secondary | ICD-10-CM | POA: Diagnosis not present

## 2012-11-13 DIAGNOSIS — R58 Hemorrhage, not elsewhere classified: Secondary | ICD-10-CM | POA: Diagnosis not present

## 2012-11-13 DIAGNOSIS — E119 Type 2 diabetes mellitus without complications: Secondary | ICD-10-CM | POA: Diagnosis not present

## 2012-11-27 DIAGNOSIS — N390 Urinary tract infection, site not specified: Secondary | ICD-10-CM | POA: Diagnosis not present

## 2012-11-27 DIAGNOSIS — E7800 Pure hypercholesterolemia, unspecified: Secondary | ICD-10-CM | POA: Diagnosis not present

## 2012-11-27 DIAGNOSIS — E119 Type 2 diabetes mellitus without complications: Secondary | ICD-10-CM | POA: Diagnosis not present

## 2012-12-16 DIAGNOSIS — N390 Urinary tract infection, site not specified: Secondary | ICD-10-CM | POA: Diagnosis not present

## 2012-12-16 DIAGNOSIS — Z940 Kidney transplant status: Secondary | ICD-10-CM | POA: Diagnosis not present

## 2012-12-16 DIAGNOSIS — I10 Essential (primary) hypertension: Secondary | ICD-10-CM | POA: Diagnosis not present

## 2012-12-18 DIAGNOSIS — B9789 Other viral agents as the cause of diseases classified elsewhere: Secondary | ICD-10-CM | POA: Diagnosis not present

## 2012-12-18 DIAGNOSIS — Z940 Kidney transplant status: Secondary | ICD-10-CM | POA: Diagnosis not present

## 2012-12-18 DIAGNOSIS — N390 Urinary tract infection, site not specified: Secondary | ICD-10-CM | POA: Diagnosis not present

## 2012-12-23 DIAGNOSIS — Z940 Kidney transplant status: Secondary | ICD-10-CM | POA: Diagnosis not present

## 2012-12-23 DIAGNOSIS — N390 Urinary tract infection, site not specified: Secondary | ICD-10-CM | POA: Diagnosis not present

## 2012-12-23 DIAGNOSIS — I10 Essential (primary) hypertension: Secondary | ICD-10-CM | POA: Diagnosis not present

## 2013-02-17 DIAGNOSIS — E139 Other specified diabetes mellitus without complications: Secondary | ICD-10-CM | POA: Diagnosis not present

## 2013-02-17 DIAGNOSIS — D899 Disorder involving the immune mechanism, unspecified: Secondary | ICD-10-CM | POA: Diagnosis not present

## 2013-02-17 DIAGNOSIS — Z48298 Encounter for aftercare following other organ transplant: Secondary | ICD-10-CM | POA: Diagnosis not present

## 2013-02-17 DIAGNOSIS — I10 Essential (primary) hypertension: Secondary | ICD-10-CM | POA: Diagnosis not present

## 2013-02-17 DIAGNOSIS — Z940 Kidney transplant status: Secondary | ICD-10-CM | POA: Diagnosis not present

## 2013-02-24 DIAGNOSIS — N186 End stage renal disease: Secondary | ICD-10-CM | POA: Diagnosis not present

## 2013-02-24 DIAGNOSIS — Z940 Kidney transplant status: Secondary | ICD-10-CM | POA: Diagnosis not present

## 2013-03-10 DIAGNOSIS — Z940 Kidney transplant status: Secondary | ICD-10-CM | POA: Diagnosis not present

## 2013-03-10 DIAGNOSIS — Z79899 Other long term (current) drug therapy: Secondary | ICD-10-CM | POA: Diagnosis not present

## 2013-03-25 DIAGNOSIS — K219 Gastro-esophageal reflux disease without esophagitis: Secondary | ICD-10-CM | POA: Diagnosis not present

## 2013-03-25 DIAGNOSIS — E7800 Pure hypercholesterolemia, unspecified: Secondary | ICD-10-CM | POA: Diagnosis not present

## 2013-03-25 DIAGNOSIS — N185 Chronic kidney disease, stage 5: Secondary | ICD-10-CM | POA: Diagnosis not present

## 2013-03-25 DIAGNOSIS — E1129 Type 2 diabetes mellitus with other diabetic kidney complication: Secondary | ICD-10-CM | POA: Diagnosis not present

## 2013-03-25 DIAGNOSIS — H6120 Impacted cerumen, unspecified ear: Secondary | ICD-10-CM | POA: Diagnosis not present

## 2013-03-25 DIAGNOSIS — J309 Allergic rhinitis, unspecified: Secondary | ICD-10-CM | POA: Diagnosis not present

## 2013-03-25 DIAGNOSIS — Z79899 Other long term (current) drug therapy: Secondary | ICD-10-CM | POA: Diagnosis not present

## 2013-03-25 DIAGNOSIS — I10 Essential (primary) hypertension: Secondary | ICD-10-CM | POA: Diagnosis not present

## 2013-05-19 DIAGNOSIS — E119 Type 2 diabetes mellitus without complications: Secondary | ICD-10-CM | POA: Diagnosis not present

## 2013-05-19 DIAGNOSIS — E785 Hyperlipidemia, unspecified: Secondary | ICD-10-CM | POA: Diagnosis not present

## 2013-05-19 DIAGNOSIS — I2510 Atherosclerotic heart disease of native coronary artery without angina pectoris: Secondary | ICD-10-CM | POA: Diagnosis not present

## 2013-05-19 DIAGNOSIS — N390 Urinary tract infection, site not specified: Secondary | ICD-10-CM | POA: Diagnosis not present

## 2013-05-19 DIAGNOSIS — R300 Dysuria: Secondary | ICD-10-CM | POA: Diagnosis not present

## 2013-05-19 DIAGNOSIS — I10 Essential (primary) hypertension: Secondary | ICD-10-CM | POA: Diagnosis not present

## 2013-06-15 DIAGNOSIS — E139 Other specified diabetes mellitus without complications: Secondary | ICD-10-CM | POA: Diagnosis not present

## 2013-06-15 DIAGNOSIS — Z940 Kidney transplant status: Secondary | ICD-10-CM | POA: Diagnosis not present

## 2013-06-15 DIAGNOSIS — N186 End stage renal disease: Secondary | ICD-10-CM | POA: Diagnosis not present

## 2013-06-17 DIAGNOSIS — E785 Hyperlipidemia, unspecified: Secondary | ICD-10-CM | POA: Diagnosis not present

## 2013-06-18 DIAGNOSIS — N186 End stage renal disease: Secondary | ICD-10-CM | POA: Diagnosis not present

## 2013-06-18 DIAGNOSIS — Z630 Problems in relationship with spouse or partner: Secondary | ICD-10-CM | POA: Diagnosis not present

## 2013-06-18 DIAGNOSIS — Z940 Kidney transplant status: Secondary | ICD-10-CM | POA: Diagnosis not present

## 2013-06-18 DIAGNOSIS — E139 Other specified diabetes mellitus without complications: Secondary | ICD-10-CM | POA: Diagnosis not present

## 2013-08-11 DIAGNOSIS — E119 Type 2 diabetes mellitus without complications: Secondary | ICD-10-CM | POA: Diagnosis not present

## 2013-08-11 DIAGNOSIS — E139 Other specified diabetes mellitus without complications: Secondary | ICD-10-CM | POA: Diagnosis not present

## 2013-08-11 DIAGNOSIS — N186 End stage renal disease: Secondary | ICD-10-CM | POA: Diagnosis not present

## 2013-08-11 DIAGNOSIS — Z940 Kidney transplant status: Secondary | ICD-10-CM | POA: Diagnosis not present

## 2013-08-23 DIAGNOSIS — R3989 Other symptoms and signs involving the genitourinary system: Secondary | ICD-10-CM | POA: Diagnosis not present

## 2013-08-23 DIAGNOSIS — N390 Urinary tract infection, site not specified: Secondary | ICD-10-CM | POA: Diagnosis not present

## 2013-09-13 DIAGNOSIS — Z940 Kidney transplant status: Secondary | ICD-10-CM | POA: Diagnosis not present

## 2013-09-15 DIAGNOSIS — N390 Urinary tract infection, site not specified: Secondary | ICD-10-CM | POA: Diagnosis not present

## 2013-09-21 DIAGNOSIS — E119 Type 2 diabetes mellitus without complications: Secondary | ICD-10-CM | POA: Diagnosis not present

## 2013-09-27 DIAGNOSIS — E559 Vitamin D deficiency, unspecified: Secondary | ICD-10-CM | POA: Diagnosis not present

## 2013-09-27 DIAGNOSIS — N9489 Other specified conditions associated with female genital organs and menstrual cycle: Secondary | ICD-10-CM | POA: Diagnosis not present

## 2013-09-27 DIAGNOSIS — E1129 Type 2 diabetes mellitus with other diabetic kidney complication: Secondary | ICD-10-CM | POA: Diagnosis not present

## 2013-09-27 DIAGNOSIS — Z0000 Encounter for general adult medical examination without abnormal findings: Secondary | ICD-10-CM | POA: Diagnosis not present

## 2013-09-27 DIAGNOSIS — Z940 Kidney transplant status: Secondary | ICD-10-CM | POA: Diagnosis not present

## 2013-09-27 DIAGNOSIS — I10 Essential (primary) hypertension: Secondary | ICD-10-CM | POA: Diagnosis not present

## 2013-09-27 DIAGNOSIS — Z79899 Other long term (current) drug therapy: Secondary | ICD-10-CM | POA: Diagnosis not present

## 2013-09-27 DIAGNOSIS — E785 Hyperlipidemia, unspecified: Secondary | ICD-10-CM | POA: Diagnosis not present

## 2013-10-20 DIAGNOSIS — H1131 Conjunctival hemorrhage, right eye: Secondary | ICD-10-CM | POA: Diagnosis not present

## 2013-10-20 DIAGNOSIS — H1130 Conjunctival hemorrhage, unspecified eye: Secondary | ICD-10-CM | POA: Diagnosis not present

## 2013-10-21 DIAGNOSIS — N186 End stage renal disease: Secondary | ICD-10-CM | POA: Diagnosis not present

## 2013-10-21 DIAGNOSIS — Z940 Kidney transplant status: Secondary | ICD-10-CM | POA: Diagnosis not present

## 2013-10-21 DIAGNOSIS — E139 Other specified diabetes mellitus without complications: Secondary | ICD-10-CM | POA: Diagnosis not present

## 2013-10-28 ENCOUNTER — Other Ambulatory Visit: Payer: Self-pay

## 2013-10-28 DIAGNOSIS — Z1231 Encounter for screening mammogram for malignant neoplasm of breast: Secondary | ICD-10-CM

## 2013-11-16 ENCOUNTER — Ambulatory Visit: Payer: Medicare Other

## 2013-12-14 ENCOUNTER — Ambulatory Visit
Admission: RE | Admit: 2013-12-14 | Discharge: 2013-12-14 | Disposition: A | Discharge status: Home / Self Care | Payer: Medicare Other | Source: Ambulatory Visit

## 2013-12-14 DIAGNOSIS — Z1231 Encounter for screening mammogram for malignant neoplasm of breast: Secondary | ICD-10-CM

## 2014-01-17 DIAGNOSIS — E784 Other hyperlipidemia: Secondary | ICD-10-CM | POA: Diagnosis not present

## 2014-01-25 DIAGNOSIS — B259 Cytomegaloviral disease, unspecified: Secondary | ICD-10-CM | POA: Diagnosis not present

## 2014-01-25 DIAGNOSIS — Z940 Kidney transplant status: Secondary | ICD-10-CM | POA: Diagnosis not present

## 2014-01-25 DIAGNOSIS — N2581 Secondary hyperparathyroidism of renal origin: Secondary | ICD-10-CM | POA: Diagnosis not present

## 2014-01-25 DIAGNOSIS — E139 Other specified diabetes mellitus without complications: Secondary | ICD-10-CM | POA: Diagnosis not present

## 2014-01-25 DIAGNOSIS — E785 Hyperlipidemia, unspecified: Secondary | ICD-10-CM | POA: Diagnosis not present

## 2014-02-17 DIAGNOSIS — E088 Diabetes mellitus due to underlying condition with unspecified complications: Secondary | ICD-10-CM | POA: Diagnosis not present

## 2014-02-17 DIAGNOSIS — Z792 Long term (current) use of antibiotics: Secondary | ICD-10-CM | POA: Diagnosis not present

## 2014-02-17 DIAGNOSIS — Z4822 Encounter for aftercare following kidney transplant: Secondary | ICD-10-CM | POA: Diagnosis not present

## 2014-02-17 DIAGNOSIS — Z940 Kidney transplant status: Secondary | ICD-10-CM | POA: Diagnosis not present

## 2014-02-17 DIAGNOSIS — I10 Essential (primary) hypertension: Secondary | ICD-10-CM | POA: Diagnosis not present

## 2014-02-17 DIAGNOSIS — N186 End stage renal disease: Secondary | ICD-10-CM | POA: Diagnosis not present

## 2014-02-17 DIAGNOSIS — Z79899 Other long term (current) drug therapy: Secondary | ICD-10-CM | POA: Diagnosis not present

## 2014-02-17 DIAGNOSIS — I120 Hypertensive chronic kidney disease with stage 5 chronic kidney disease or end stage renal disease: Secondary | ICD-10-CM | POA: Diagnosis not present

## 2014-03-21 DIAGNOSIS — J309 Allergic rhinitis, unspecified: Secondary | ICD-10-CM | POA: Diagnosis not present

## 2014-03-21 DIAGNOSIS — N3000 Acute cystitis without hematuria: Secondary | ICD-10-CM | POA: Diagnosis not present

## 2014-03-21 DIAGNOSIS — R350 Frequency of micturition: Secondary | ICD-10-CM | POA: Diagnosis not present

## 2014-04-12 DIAGNOSIS — Z940 Kidney transplant status: Secondary | ICD-10-CM | POA: Diagnosis not present

## 2014-04-12 DIAGNOSIS — N186 End stage renal disease: Secondary | ICD-10-CM | POA: Diagnosis not present

## 2014-04-12 DIAGNOSIS — E139 Other specified diabetes mellitus without complications: Secondary | ICD-10-CM | POA: Diagnosis not present

## 2014-05-01 DIAGNOSIS — E119 Type 2 diabetes mellitus without complications: Secondary | ICD-10-CM | POA: Diagnosis not present

## 2014-05-01 DIAGNOSIS — Z79899 Other long term (current) drug therapy: Secondary | ICD-10-CM | POA: Diagnosis not present

## 2014-05-01 DIAGNOSIS — N390 Urinary tract infection, site not specified: Secondary | ICD-10-CM | POA: Diagnosis not present

## 2014-05-01 DIAGNOSIS — R51 Headache: Secondary | ICD-10-CM | POA: Diagnosis not present

## 2014-05-01 DIAGNOSIS — Z940 Kidney transplant status: Secondary | ICD-10-CM | POA: Diagnosis not present

## 2014-05-04 DIAGNOSIS — E139 Other specified diabetes mellitus without complications: Secondary | ICD-10-CM | POA: Diagnosis not present

## 2014-05-04 DIAGNOSIS — Z940 Kidney transplant status: Secondary | ICD-10-CM | POA: Diagnosis not present

## 2014-06-05 DIAGNOSIS — N2581 Secondary hyperparathyroidism of renal origin: Secondary | ICD-10-CM | POA: Diagnosis not present

## 2014-06-05 DIAGNOSIS — I120 Hypertensive chronic kidney disease with stage 5 chronic kidney disease or end stage renal disease: Secondary | ICD-10-CM | POA: Diagnosis not present

## 2014-06-05 DIAGNOSIS — K5732 Diverticulitis of large intestine without perforation or abscess without bleeding: Secondary | ICD-10-CM | POA: Diagnosis not present

## 2014-06-05 DIAGNOSIS — E119 Type 2 diabetes mellitus without complications: Secondary | ICD-10-CM | POA: Diagnosis not present

## 2014-06-05 DIAGNOSIS — N186 End stage renal disease: Secondary | ICD-10-CM | POA: Diagnosis not present

## 2014-06-05 DIAGNOSIS — R1011 Right upper quadrant pain: Secondary | ICD-10-CM | POA: Diagnosis not present

## 2014-06-12 ENCOUNTER — Encounter (HOSPITAL_COMMUNITY): Payer: Self-pay | Admitting: *Deleted

## 2014-06-12 ENCOUNTER — Emergency Department (HOSPITAL_COMMUNITY)
Admission: EM | Admit: 2014-06-12 | Discharge: 2014-06-12 | Disposition: A | Discharge status: Home / Self Care | Payer: Medicare Other | Attending: Emergency Medicine | Admitting: Emergency Medicine

## 2014-06-12 DIAGNOSIS — Z9851 Tubal ligation status: Secondary | ICD-10-CM | POA: Diagnosis not present

## 2014-06-12 DIAGNOSIS — I10 Essential (primary) hypertension: Secondary | ICD-10-CM | POA: Insufficient documentation

## 2014-06-12 DIAGNOSIS — Z79899 Other long term (current) drug therapy: Secondary | ICD-10-CM | POA: Insufficient documentation

## 2014-06-12 DIAGNOSIS — N390 Urinary tract infection, site not specified: Secondary | ICD-10-CM | POA: Diagnosis not present

## 2014-06-12 DIAGNOSIS — R300 Dysuria: Secondary | ICD-10-CM | POA: Diagnosis present

## 2014-06-12 LAB — COMPREHENSIVE METABOLIC PANEL
ALBUMIN: 3.3 g/dL — AB (ref 3.5–5.0)
ALT: 30 U/L (ref 14–54)
AST: 29 U/L (ref 15–41)
Alkaline Phosphatase: 49 U/L (ref 38–126)
Anion gap: 8 (ref 5–15)
BUN: 15 mg/dL (ref 6–20)
CHLORIDE: 105 mmol/L (ref 101–111)
CO2: 26 mmol/L (ref 22–32)
Calcium: 8.9 mg/dL (ref 8.9–10.3)
Creatinine, Ser: 0.96 mg/dL (ref 0.44–1.00)
GFR calc Af Amer: >60 mL/min (ref >60)
GFR calc non Af Amer: 59 mL/min — ABNORMAL LOW (ref >60)
GLUCOSE: 178 mg/dL — AB (ref 65–99)
POTASSIUM: 3.4 mmol/L — AB (ref 3.5–5.1)
SODIUM: 139 mmol/L (ref 135–145)
TOTAL PROTEIN: 6.6 g/dL (ref 6.5–8.1)
Total Bilirubin: 0.4 mg/dL (ref 0.3–1.2)

## 2014-06-12 LAB — URINALYSIS, ROUTINE W REFLEX MICROSCOPIC
Bilirubin Urine: NEGATIVE
GLUCOSE, UA: NEGATIVE mg/dL
Ketones, ur: NEGATIVE mg/dL
NITRITE: NEGATIVE
Protein, ur: NEGATIVE mg/dL
SPECIFIC GRAVITY, URINE: 1.011 (ref 1.005–1.030)
UROBILINOGEN UA: 0.2 mg/dL (ref 0.0–1.0)
pH: 5.5 (ref 5.0–8.0)

## 2014-06-12 LAB — CBC
HCT: 37.5 % (ref 36.0–46.0)
HEMOGLOBIN: 12.8 g/dL (ref 12.0–15.0)
MCH: 30.8 pg (ref 26.0–34.0)
MCHC: 34.1 g/dL (ref 30.0–36.0)
MCV: 90.4 fL (ref 78.0–100.0)
Platelets: 265 10*3/uL (ref 150–400)
RBC: 4.15 MIL/uL (ref 3.87–5.11)
RDW: 13.2 % (ref 11.5–15.5)
WBC: 6.7 10*3/uL (ref 4.0–10.5)

## 2014-06-12 LAB — URINE MICROSCOPIC-ADD ON

## 2014-06-12 MED ORDER — CEPHALEXIN 500 MG PO CAPS: 500.0000 mg | ORAL_CAPSULE | Freq: Four times a day (QID) | ORAL | Status: DC

## 2014-06-12 MED ORDER — PHENAZOPYRIDINE HCL 200 MG PO TABS: 200.0000 mg | ORAL_TABLET | Freq: Three times a day (TID) | ORAL | Status: DC

## 2014-06-12 NOTE — Discharge Instructions (Signed)

## 2014-06-12 NOTE — ED Notes (Signed)
Pt in c/o pain during urination and urinary frequency, denies pain unless she is urinating. Pt had a kidney transplant three years ago. No distress noted.

## 2014-06-12 NOTE — ED Provider Notes (Signed)
CSN: [id number]     Arrival date & time 06/12/14  1048 History   First MD Initiated Contact with Patient 06/12/14 1051     Chief Complaint  Patient presents with  . Dysuria     (Consider location/radiation/quality/duration/timing/severity/associated sxs/prior Treatment) HPI   PCP: PROVIDER NOT IN SYSTEM There were no vitals taken for this visit.  [name] is a 69 y.o.female with a significant PMH of renal insufficiency and hypertension presents to the ER with complaints of possible bladder infection. She had a renal transplant 3 years ago but reports normal kidney function now. She has had 3 days of urinary urgency and dysuria. She has not had any fevers, hematuria, flank pains, abdominal pains, back pains, N/V/D or weakness. She does admit to feeling tired.   Past Medical History  Diagnosis Date  . Renal insufficiency   . Hypertension   . Hx of tubal ligation t   Past Surgical History  Procedure Laterality Date  . Peritoneal catheter insertion    . Peritoneal catheter removal    . Kidney transplant     History reviewed. No pertinent family history. History  Substance Use Topics  . Smoking status: Never Smoker   . Smokeless tobacco: Not on file  . Alcohol Use: No   OB History    No data available     Review of Systems  10 Systems reviewed and are negative for acute change except as noted in the HPI.     Allergies  Review of patient's allergies indicates no known allergies.  Home Medications   Prior to Admission medications   Medication Sig Start Date End Date Taking? Authorizing Provider  cephALEXin (KEFLEX) 500 MG capsule Take 1 capsule (500 mg total) by mouth 4 (four) times daily. 06/12/14   Delos Haring, PA-C  cloNIDine (CATAPRES) 0.1 MG tablet Take 0.1 mg by mouth 3 (three) times daily.      Historical Provider, MD  estradiol (VIVELLE-DOT) 0.025 MG/24HR Place 1 patch onto the skin 2 (two) times a week.      Historical Provider, MD  fluticasone  (FLONASE) 50 MCG/ACT nasal spray Place 2 sprays into the nose daily. 01/13/11 01/13/12  Domenic Moras, PA-C  phenazopyridine (PYRIDIUM) 200 MG tablet Take 1 tablet (200 mg total) by mouth 3 (three) times daily. 06/12/14   Delos Haring, PA-C  PRESCRIPTION MEDICATION Take 0.5 tablets by mouth daily. Nor syndrome hormonal tablet     Historical Provider, MD  sevelamer (RENVELA) 800 MG tablet Take 1,600 mg by mouth 3 (three) times daily with meals.      Historical Provider, MD   BP 119/58 mmHg  Pulse 58  Temp(Src) 98 F (36.7 C) (Oral)  Resp 18  SpO2 97% Physical Exam  Constitutional: She appears well-developed and well-nourished. No distress.  HENT:  Head: Normocephalic and atraumatic.  Eyes: Pupils are equal, round, and reactive to light.  Neck: Normal range of motion. Neck supple.  Cardiovascular: Normal rate and regular rhythm.   Pulmonary/Chest: Effort normal.  Abdominal: Soft. Bowel sounds are normal. She exhibits no distension. There is no tenderness. There is no rigidity, no rebound, no guarding and no CVA tenderness.  Neurological: She is alert.  Skin: Skin is warm and dry. She is not diaphoretic.  Nursing note and vitals reviewed.   ED Course  Procedures (including critical care time) Labs Review Labs Reviewed  COMPREHENSIVE METABOLIC PANEL - Abnormal; Notable for the following:    Potassium 3.4 (*)    Glucose, Bld  178 (*)    Albumin 3.3 (*)    GFR calc non Af Amer 59 (*)    All other components within normal limits  URINALYSIS, ROUTINE W REFLEX MICROSCOPIC (NOT AT [facility]) - Abnormal; Notable for the following:    APPearance CLOUDY (*)    Hgb urine dipstick MODERATE (*)    Leukocytes, UA LARGE (*)    All other components within normal limits  URINE MICROSCOPIC-ADD ON - Abnormal; Notable for the following:    Bacteria, UA FEW (*)    All other components within normal limits  URINE CULTURE  CBC    Imaging Review No results found.   EKG Interpretation None       MDM   Final diagnoses:  Urinary tract infection, acute    Dr. Regenia Skeeter has seen patient as well. Will give rx for Keflex and Pyridium, kidney  Function WNL. Urine culture sent out, she is to follow-up with PCP. Pt with no systemic symptoms and overall is feeling well without fever, N/V/D.  Medications - No data to display  69 y.o.[name]'s evaluation in the Emergency Department is complete. It has been determined that no acute conditions requiring further emergency intervention are present at this time. The patient/guardian have been advised of the diagnosis and plan. We have discussed signs and symptoms that warrant return to the ED, such as changes or worsening in symptoms.  Vital signs are stable at discharge. Filed Vitals:   06/12/14 1130  BP: 119/58  Pulse: 58  Temp:   Resp:      Patient/guardian has voiced understanding and agreed to follow-up with the PCP or specialist.     Delos Haring, PA-C 06/12/14 1222  Sherwood Gambler, MD 06/13/14 [phone]

## 2014-06-14 LAB — URINE CULTURE: Colony Count: >=100000

## 2014-06-15 ENCOUNTER — Telehealth (HOSPITAL_BASED_OUTPATIENT_CLINIC_OR_DEPARTMENT_OTHER): Payer: Self-pay | Admitting: Emergency Medicine

## 2014-06-15 NOTE — Telephone Encounter (Signed)
Post ED Visit - Positive Culture Follow-up  Culture report reviewed by antimicrobial stewardship pharmacist: []  Wes Donnella Sham, Pharm.D., BCPS [x]  Heide Guile, Pharm.D., BCPS []  Alycia Rossetti, Pharm.D., BCPS []  [place], Pharm.D., BCPS, AAHIVP []  Legrand Como, Pharm.D., BCPS, AAHIVP []  Isac Sarna, Pharm.D., BCPS  Positive urine culture E. Coli Treated with cephalexin, organism sensitive to the same and no further patient follow-up is required at this time.  Hazle Nordmann 06/15/2014, 9:08 AM

## 2014-06-22 DIAGNOSIS — R300 Dysuria: Secondary | ICD-10-CM | POA: Diagnosis not present

## 2014-06-22 DIAGNOSIS — N390 Urinary tract infection, site not specified: Secondary | ICD-10-CM | POA: Diagnosis not present

## 2014-07-12 DIAGNOSIS — Z6828 Body mass index (BMI) 28.0-28.9, adult: Secondary | ICD-10-CM | POA: Diagnosis not present

## 2014-07-12 DIAGNOSIS — Z124 Encounter for screening for malignant neoplasm of cervix: Secondary | ICD-10-CM | POA: Diagnosis not present

## 2014-07-12 DIAGNOSIS — Z01419 Encounter for gynecological examination (general) (routine) without abnormal findings: Secondary | ICD-10-CM | POA: Diagnosis not present

## 2014-07-28 DIAGNOSIS — N760 Acute vaginitis: Secondary | ICD-10-CM | POA: Diagnosis not present

## 2014-07-28 DIAGNOSIS — N390 Urinary tract infection, site not specified: Secondary | ICD-10-CM | POA: Diagnosis not present

## 2014-07-28 DIAGNOSIS — R300 Dysuria: Secondary | ICD-10-CM | POA: Diagnosis not present

## 2014-08-01 ENCOUNTER — Encounter: Payer: Medicare Other | Admitting: Obstetrics & Gynecology

## 2014-08-08 DIAGNOSIS — E785 Hyperlipidemia, unspecified: Secondary | ICD-10-CM | POA: Diagnosis not present

## 2014-08-08 DIAGNOSIS — Z940 Kidney transplant status: Secondary | ICD-10-CM | POA: Diagnosis not present

## 2014-08-08 DIAGNOSIS — E139 Other specified diabetes mellitus without complications: Secondary | ICD-10-CM | POA: Diagnosis not present

## 2014-08-24 DIAGNOSIS — Z940 Kidney transplant status: Secondary | ICD-10-CM | POA: Diagnosis not present

## 2014-08-24 DIAGNOSIS — N186 End stage renal disease: Secondary | ICD-10-CM | POA: Diagnosis not present

## 2014-08-24 DIAGNOSIS — E139 Other specified diabetes mellitus without complications: Secondary | ICD-10-CM | POA: Diagnosis not present

## 2014-09-30 DIAGNOSIS — I10 Essential (primary) hypertension: Secondary | ICD-10-CM | POA: Diagnosis not present

## 2014-09-30 DIAGNOSIS — N3000 Acute cystitis without hematuria: Secondary | ICD-10-CM | POA: Diagnosis not present

## 2014-09-30 DIAGNOSIS — E785 Hyperlipidemia, unspecified: Secondary | ICD-10-CM | POA: Diagnosis not present

## 2014-09-30 DIAGNOSIS — N390 Urinary tract infection, site not specified: Secondary | ICD-10-CM | POA: Diagnosis not present

## 2014-09-30 DIAGNOSIS — J309 Allergic rhinitis, unspecified: Secondary | ICD-10-CM | POA: Diagnosis not present

## 2014-09-30 DIAGNOSIS — E1122 Type 2 diabetes mellitus with diabetic chronic kidney disease: Secondary | ICD-10-CM | POA: Diagnosis not present

## 2014-09-30 DIAGNOSIS — N185 Chronic kidney disease, stage 5: Secondary | ICD-10-CM | POA: Diagnosis not present

## 2014-09-30 DIAGNOSIS — Z23 Encounter for immunization: Secondary | ICD-10-CM | POA: Diagnosis not present

## 2014-09-30 DIAGNOSIS — Z940 Kidney transplant status: Secondary | ICD-10-CM | POA: Diagnosis not present

## 2014-10-20 DIAGNOSIS — E1122 Type 2 diabetes mellitus with diabetic chronic kidney disease: Secondary | ICD-10-CM | POA: Diagnosis not present

## 2014-10-20 DIAGNOSIS — Z0000 Encounter for general adult medical examination without abnormal findings: Secondary | ICD-10-CM | POA: Diagnosis not present

## 2014-10-20 DIAGNOSIS — E785 Hyperlipidemia, unspecified: Secondary | ICD-10-CM | POA: Diagnosis not present

## 2014-10-20 DIAGNOSIS — E1165 Type 2 diabetes mellitus with hyperglycemia: Secondary | ICD-10-CM | POA: Diagnosis not present

## 2014-10-20 DIAGNOSIS — Z940 Kidney transplant status: Secondary | ICD-10-CM | POA: Diagnosis not present

## 2014-10-20 DIAGNOSIS — N185 Chronic kidney disease, stage 5: Secondary | ICD-10-CM | POA: Diagnosis not present

## 2014-10-20 DIAGNOSIS — Z23 Encounter for immunization: Secondary | ICD-10-CM | POA: Diagnosis not present

## 2014-10-20 DIAGNOSIS — Z79899 Other long term (current) drug therapy: Secondary | ICD-10-CM | POA: Diagnosis not present

## 2014-11-02 DIAGNOSIS — Z940 Kidney transplant status: Secondary | ICD-10-CM | POA: Diagnosis not present

## 2014-11-02 DIAGNOSIS — E139 Other specified diabetes mellitus without complications: Secondary | ICD-10-CM | POA: Diagnosis not present

## 2014-11-02 DIAGNOSIS — E785 Hyperlipidemia, unspecified: Secondary | ICD-10-CM | POA: Diagnosis not present

## 2014-11-29 DIAGNOSIS — J069 Acute upper respiratory infection, unspecified: Secondary | ICD-10-CM | POA: Diagnosis not present

## 2014-12-21 ENCOUNTER — Encounter (HOSPITAL_COMMUNITY): Payer: Self-pay | Admitting: Emergency Medicine

## 2014-12-21 ENCOUNTER — Emergency Department (HOSPITAL_COMMUNITY)
Admission: EM | Admit: 2014-12-21 | Discharge: 2014-12-21 | Discharge status: Against Medical Advice | Payer: Medicare Other | Attending: Emergency Medicine | Admitting: Emergency Medicine

## 2014-12-21 DIAGNOSIS — R1110 Vomiting, unspecified: Secondary | ICD-10-CM | POA: Insufficient documentation

## 2014-12-21 DIAGNOSIS — R197 Diarrhea, unspecified: Secondary | ICD-10-CM | POA: Insufficient documentation

## 2014-12-21 DIAGNOSIS — I10 Essential (primary) hypertension: Secondary | ICD-10-CM | POA: Diagnosis not present

## 2014-12-21 DIAGNOSIS — R1030 Lower abdominal pain, unspecified: Secondary | ICD-10-CM | POA: Diagnosis not present

## 2014-12-21 DIAGNOSIS — M545 Low back pain: Secondary | ICD-10-CM | POA: Diagnosis not present

## 2014-12-21 LAB — COMPREHENSIVE METABOLIC PANEL
ALK PHOS: 60 U/L (ref 38–126)
ALT: 23 U/L (ref 14–54)
AST: 25 U/L (ref 15–41)
Albumin: 4.3 g/dL (ref 3.5–5.0)
Anion gap: 11 (ref 5–15)
BILIRUBIN TOTAL: 0.7 mg/dL (ref 0.3–1.2)
BUN: 16 mg/dL (ref 6–20)
CO2: 26 mmol/L (ref 22–32)
CREATININE: 0.89 mg/dL (ref 0.44–1.00)
Calcium: 9.9 mg/dL (ref 8.9–10.3)
Chloride: 103 mmol/L (ref 101–111)
GFR calc non Af Amer: >60 mL/min (ref >60)
Glucose, Bld: 174 mg/dL — ABNORMAL HIGH (ref 65–99)
Potassium: 3.6 mmol/L (ref 3.5–5.1)
Sodium: 140 mmol/L (ref 135–145)
Total Protein: 7.9 g/dL (ref 6.5–8.1)

## 2014-12-21 LAB — CBC
HCT: 44.5 % (ref 36.0–46.0)
Hemoglobin: 14.9 g/dL (ref 12.0–15.0)
MCH: 31 pg (ref 26.0–34.0)
MCHC: 33.5 g/dL (ref 30.0–36.0)
MCV: 92.5 fL (ref 78.0–100.0)
PLATELETS: 288 10*3/uL (ref 150–400)
RBC: 4.81 MIL/uL (ref 3.87–5.11)
RDW: 13.4 % (ref 11.5–15.5)
WBC: 6.2 10*3/uL (ref 4.0–10.5)

## 2014-12-21 LAB — LIPASE, BLOOD: Lipase: 26 U/L (ref 11–51)

## 2014-12-21 MED ORDER — ONDANSETRON 4 MG PO TBDP
ORAL_TABLET | ORAL | Status: AC
  Filled 2014-12-21: qty 1

## 2014-12-21 MED ORDER — ONDANSETRON 4 MG PO TBDP
4.0000 mg | ORAL_TABLET | Freq: Once | ORAL | Status: AC | PRN
  Administered 2014-12-21: 4 mg via ORAL

## 2014-12-21 NOTE — ED Notes (Signed)
Pt states on Monday she started having frequent diarrhea and started having lower abdominal cramping on Tuesday and feeling nauseous. Pt reports have multiple episodes of diarrhea all through the night and one episodes of vomiting.  Pt reports feeling sick ever since eating japanese food on Sunday night.

## 2014-12-21 NOTE — ED Notes (Signed)
Pt reassessed and here with lower back pain, vomiting, and diarrhea. PT not actively vomiting.  Updated on situation and care.  VSS

## 2014-12-22 DIAGNOSIS — N390 Urinary tract infection, site not specified: Secondary | ICD-10-CM | POA: Diagnosis not present

## 2014-12-22 DIAGNOSIS — R197 Diarrhea, unspecified: Secondary | ICD-10-CM | POA: Diagnosis not present

## 2014-12-28 DIAGNOSIS — I10 Essential (primary) hypertension: Secondary | ICD-10-CM | POA: Diagnosis not present

## 2014-12-28 DIAGNOSIS — Z940 Kidney transplant status: Secondary | ICD-10-CM | POA: Diagnosis not present

## 2014-12-28 DIAGNOSIS — E139 Other specified diabetes mellitus without complications: Secondary | ICD-10-CM | POA: Diagnosis not present

## 2014-12-28 DIAGNOSIS — N186 End stage renal disease: Secondary | ICD-10-CM | POA: Diagnosis not present

## 2014-12-28 DIAGNOSIS — K219 Gastro-esophageal reflux disease without esophagitis: Secondary | ICD-10-CM | POA: Diagnosis not present

## 2015-01-11 DIAGNOSIS — E785 Hyperlipidemia, unspecified: Secondary | ICD-10-CM | POA: Diagnosis not present

## 2015-01-11 DIAGNOSIS — Z940 Kidney transplant status: Secondary | ICD-10-CM | POA: Diagnosis not present

## 2015-01-11 DIAGNOSIS — E119 Type 2 diabetes mellitus without complications: Secondary | ICD-10-CM | POA: Diagnosis not present

## 2015-02-20 DIAGNOSIS — M8588 Other specified disorders of bone density and structure, other site: Secondary | ICD-10-CM | POA: Diagnosis not present

## 2015-02-20 DIAGNOSIS — N958 Other specified menopausal and perimenopausal disorders: Secondary | ICD-10-CM | POA: Diagnosis not present

## 2015-02-20 DIAGNOSIS — Z1231 Encounter for screening mammogram for malignant neoplasm of breast: Secondary | ICD-10-CM | POA: Diagnosis not present

## 2015-02-22 DIAGNOSIS — Z4822 Encounter for aftercare following kidney transplant: Secondary | ICD-10-CM | POA: Diagnosis not present

## 2015-02-22 DIAGNOSIS — Z79899 Other long term (current) drug therapy: Secondary | ICD-10-CM | POA: Diagnosis not present

## 2015-02-22 DIAGNOSIS — Z792 Long term (current) use of antibiotics: Secondary | ICD-10-CM | POA: Diagnosis not present

## 2015-02-22 DIAGNOSIS — Z0000 Encounter for general adult medical examination without abnormal findings: Secondary | ICD-10-CM | POA: Diagnosis not present

## 2015-02-22 DIAGNOSIS — N186 End stage renal disease: Secondary | ICD-10-CM | POA: Diagnosis not present

## 2015-02-22 DIAGNOSIS — E088 Diabetes mellitus due to underlying condition with unspecified complications: Secondary | ICD-10-CM | POA: Diagnosis not present

## 2015-02-22 DIAGNOSIS — I10 Essential (primary) hypertension: Secondary | ICD-10-CM | POA: Diagnosis not present

## 2015-02-22 DIAGNOSIS — L659 Nonscarring hair loss, unspecified: Secondary | ICD-10-CM | POA: Diagnosis not present

## 2015-02-22 DIAGNOSIS — Z886 Allergy status to analgesic agent status: Secondary | ICD-10-CM | POA: Diagnosis not present

## 2015-02-22 DIAGNOSIS — D8989 Other specified disorders involving the immune mechanism, not elsewhere classified: Secondary | ICD-10-CM | POA: Diagnosis not present

## 2015-02-22 DIAGNOSIS — Z940 Kidney transplant status: Secondary | ICD-10-CM | POA: Diagnosis not present

## 2015-02-22 DIAGNOSIS — Z992 Dependence on renal dialysis: Secondary | ICD-10-CM | POA: Diagnosis not present

## 2015-02-22 DIAGNOSIS — D899 Disorder involving the immune mechanism, unspecified: Secondary | ICD-10-CM | POA: Diagnosis not present

## 2015-02-22 DIAGNOSIS — Z87440 Personal history of urinary (tract) infections: Secondary | ICD-10-CM | POA: Diagnosis not present

## 2015-02-22 DIAGNOSIS — I120 Hypertensive chronic kidney disease with stage 5 chronic kidney disease or end stage renal disease: Secondary | ICD-10-CM | POA: Diagnosis not present

## 2015-02-22 DIAGNOSIS — B259 Cytomegaloviral disease, unspecified: Secondary | ICD-10-CM | POA: Diagnosis not present

## 2015-03-30 DIAGNOSIS — R319 Hematuria, unspecified: Secondary | ICD-10-CM | POA: Diagnosis not present

## 2015-03-30 DIAGNOSIS — N390 Urinary tract infection, site not specified: Secondary | ICD-10-CM | POA: Diagnosis not present

## 2015-04-04 DIAGNOSIS — Z940 Kidney transplant status: Secondary | ICD-10-CM | POA: Diagnosis not present

## 2015-04-04 DIAGNOSIS — E119 Type 2 diabetes mellitus without complications: Secondary | ICD-10-CM | POA: Diagnosis not present

## 2015-04-04 DIAGNOSIS — E785 Hyperlipidemia, unspecified: Secondary | ICD-10-CM | POA: Diagnosis not present

## 2015-05-05 IMAGING — MG MM SCREENING BREAST TOMO BILATERAL
8 series · 9 of 24 positions shown · non-contrast
Comparison: Previous exam(s).

CLINICAL DATA: Screening.

EXAM:
DIGITAL SCREENING BILATERAL MAMMOGRAM WITH 3D TOMO WITH CAD

[L MLO]
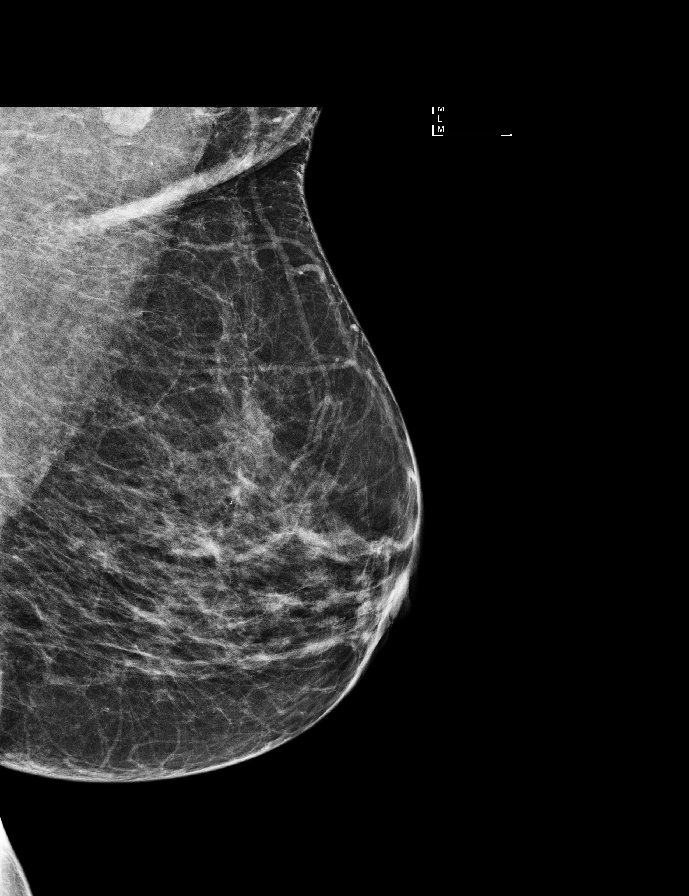

[R CC]
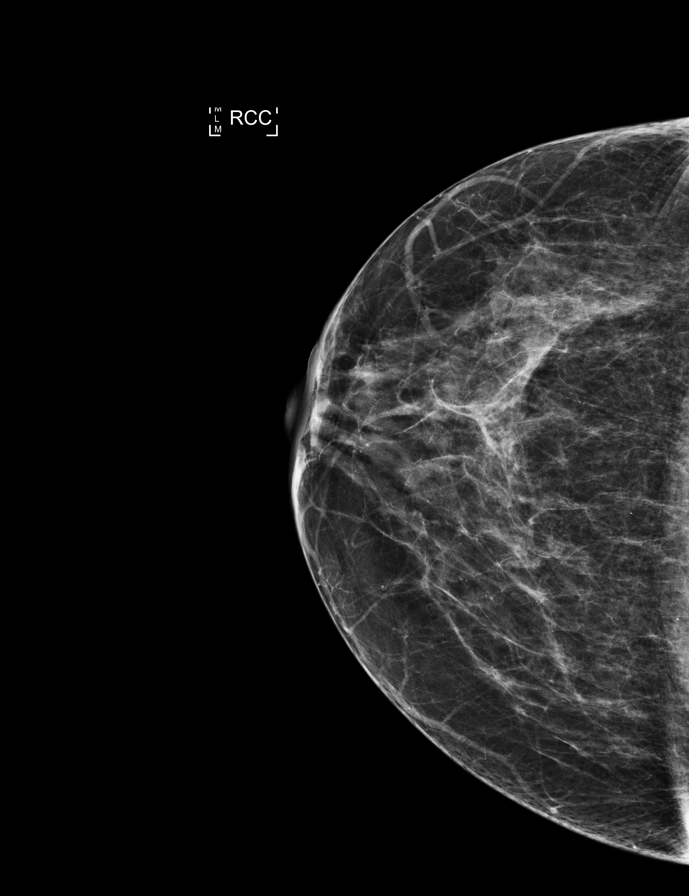

[R MLO]
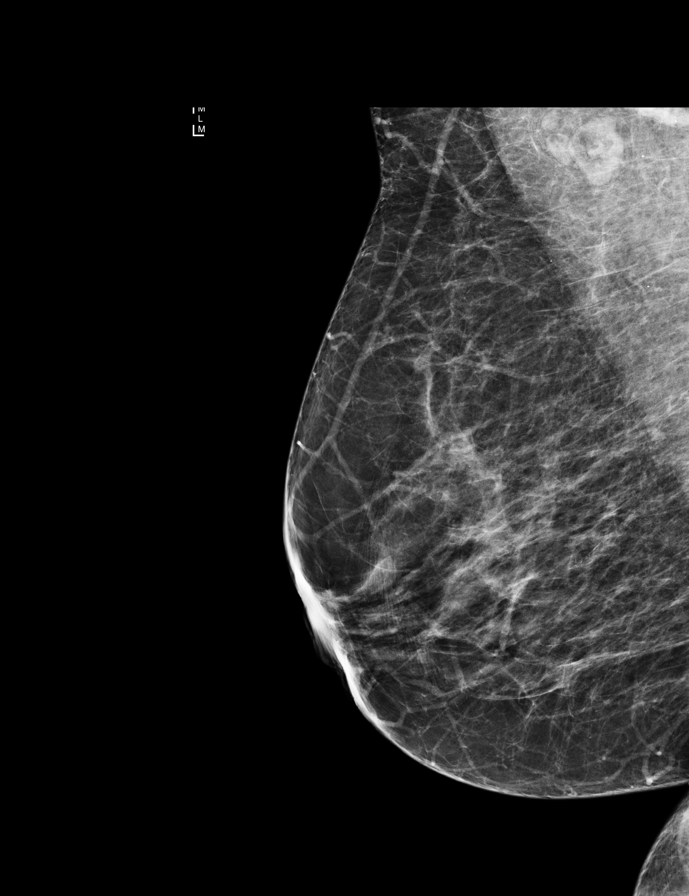

[L CC]
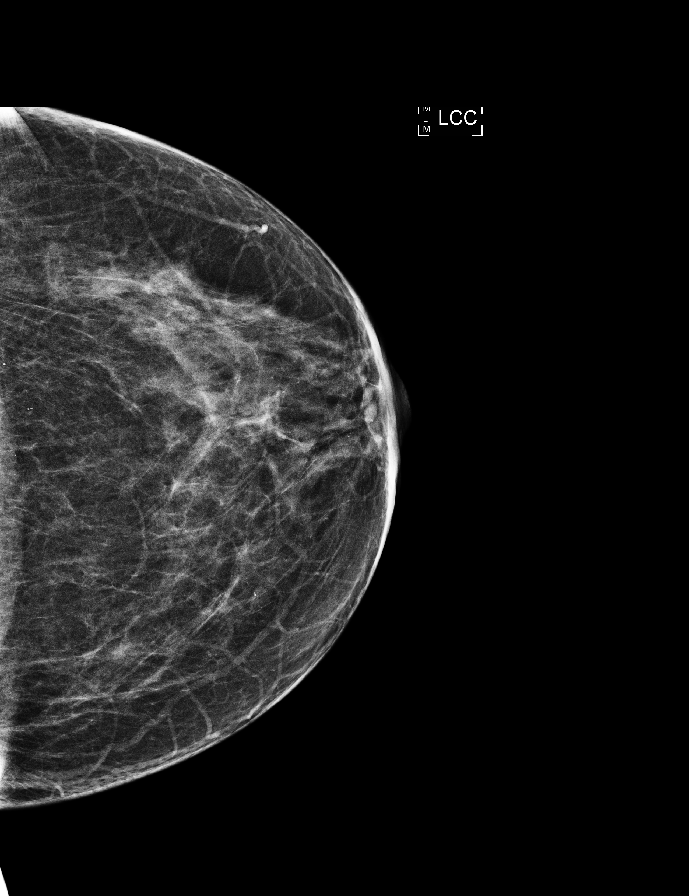

[L CC tomo · 2 of 54 frames shown]
[frame 18/54]
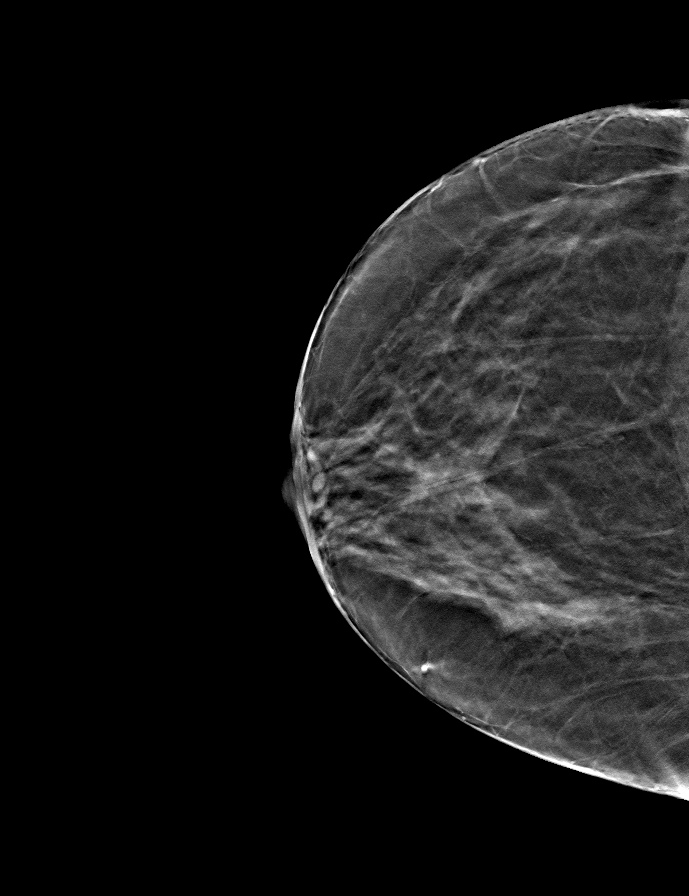
[frame 27/54]
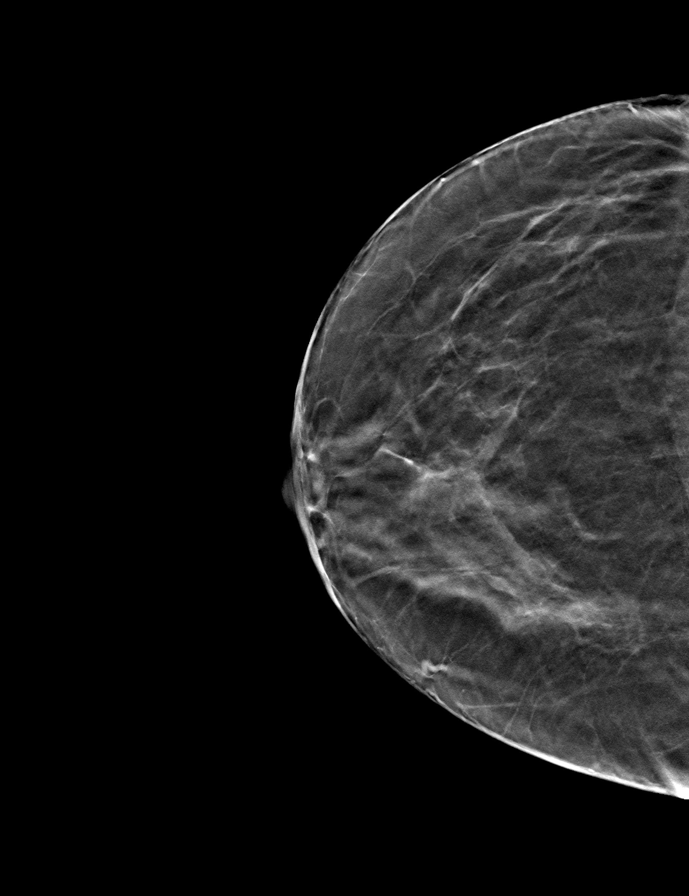

[L MLO tomo · tomo slice 31/60.0]
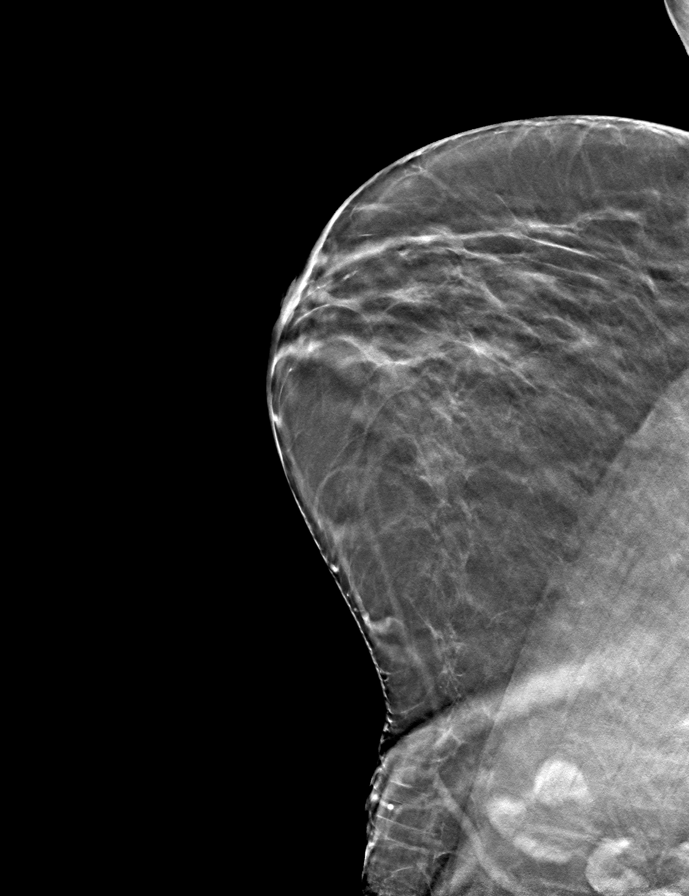

[R CC tomo · tomo slice 27/54.0]
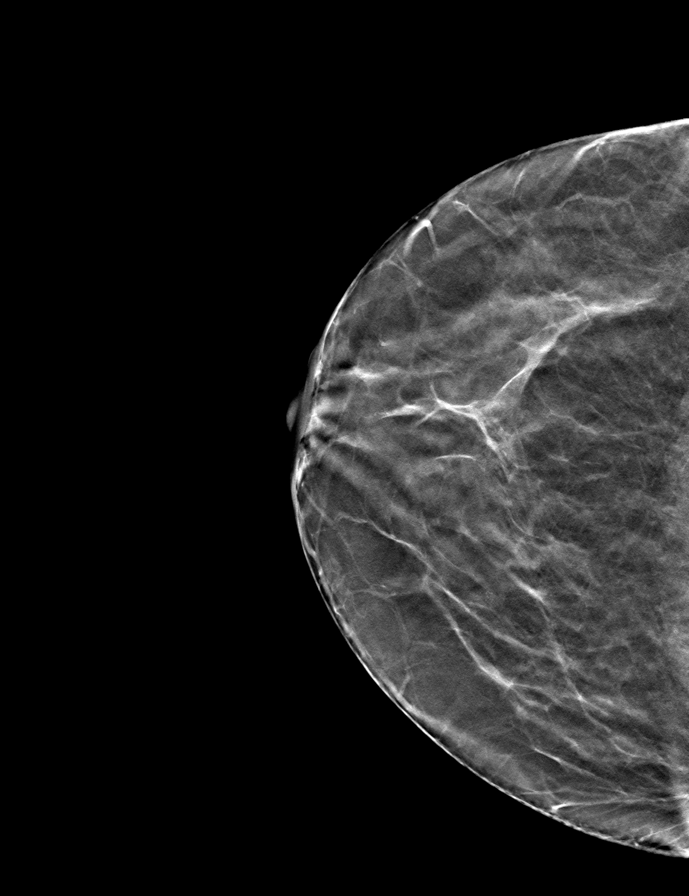

[R MLO tomo · tomo slice 31/61.0]
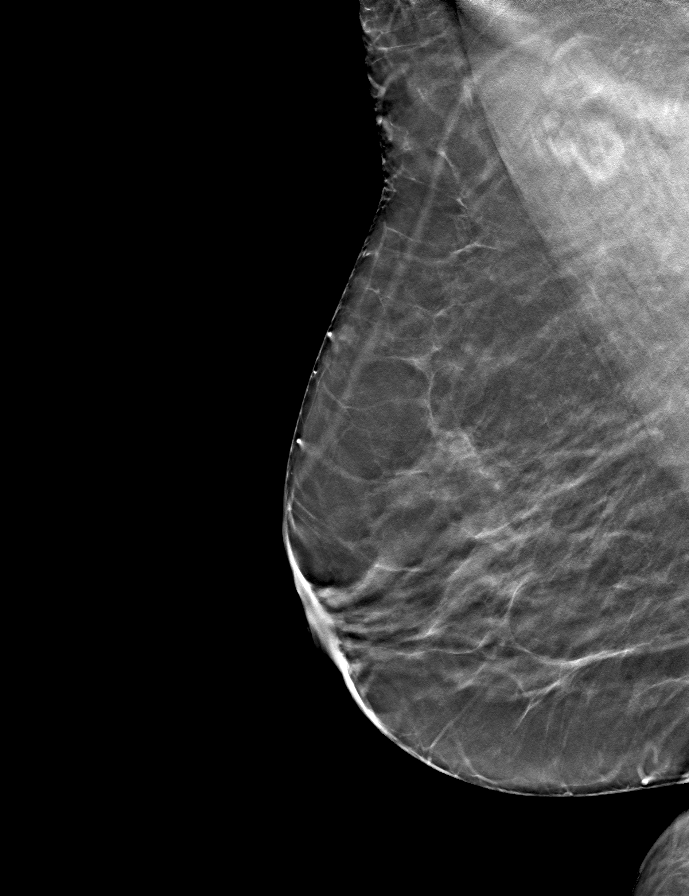

[9 of 24 positions shown; findings below may reference images not displayed]

ACR Breast Density Category b: There are scattered areas of
fibroglandular density.
FINDINGS: There are no findings suspicious for malignancy. Images were
processed with CAD.
IMPRESSION: No mammographic evidence of malignancy. A result letter of this
screening mammogram will be mailed directly to the patient.

RECOMMENDATION:
Screening mammogram in one year. (Code:55-L-23V)

BI-RADS CATEGORY  1: Negative.

## 2015-05-30 DIAGNOSIS — N186 End stage renal disease: Secondary | ICD-10-CM | POA: Diagnosis not present

## 2015-05-30 DIAGNOSIS — E139 Other specified diabetes mellitus without complications: Secondary | ICD-10-CM | POA: Diagnosis not present

## 2015-05-30 DIAGNOSIS — K219 Gastro-esophageal reflux disease without esophagitis: Secondary | ICD-10-CM | POA: Diagnosis not present

## 2015-05-30 DIAGNOSIS — Z940 Kidney transplant status: Secondary | ICD-10-CM | POA: Diagnosis not present

## 2015-05-30 DIAGNOSIS — I10 Essential (primary) hypertension: Secondary | ICD-10-CM | POA: Diagnosis not present

## 2015-06-09 DIAGNOSIS — Z940 Kidney transplant status: Secondary | ICD-10-CM | POA: Diagnosis not present

## 2015-06-09 DIAGNOSIS — E785 Hyperlipidemia, unspecified: Secondary | ICD-10-CM | POA: Diagnosis not present

## 2015-06-09 DIAGNOSIS — E119 Type 2 diabetes mellitus without complications: Secondary | ICD-10-CM | POA: Diagnosis not present

## 2015-08-09 DIAGNOSIS — R300 Dysuria: Secondary | ICD-10-CM | POA: Diagnosis not present

## 2015-08-09 DIAGNOSIS — N3000 Acute cystitis without hematuria: Secondary | ICD-10-CM | POA: Diagnosis not present

## 2015-08-28 DIAGNOSIS — R350 Frequency of micturition: Secondary | ICD-10-CM | POA: Diagnosis not present

## 2015-08-28 DIAGNOSIS — Z6828 Body mass index (BMI) 28.0-28.9, adult: Secondary | ICD-10-CM | POA: Diagnosis not present

## 2015-08-28 DIAGNOSIS — Z01419 Encounter for gynecological examination (general) (routine) without abnormal findings: Secondary | ICD-10-CM | POA: Diagnosis not present

## 2015-08-30 DIAGNOSIS — N390 Urinary tract infection, site not specified: Secondary | ICD-10-CM | POA: Diagnosis not present

## 2015-08-30 DIAGNOSIS — R319 Hematuria, unspecified: Secondary | ICD-10-CM | POA: Diagnosis not present

## 2015-09-26 DIAGNOSIS — Z6829 Body mass index (BMI) 29.0-29.9, adult: Secondary | ICD-10-CM | POA: Diagnosis not present

## 2015-09-26 DIAGNOSIS — Z23 Encounter for immunization: Secondary | ICD-10-CM | POA: Diagnosis not present

## 2015-09-26 DIAGNOSIS — E785 Hyperlipidemia, unspecified: Secondary | ICD-10-CM | POA: Diagnosis not present

## 2015-09-26 DIAGNOSIS — N2581 Secondary hyperparathyroidism of renal origin: Secondary | ICD-10-CM | POA: Diagnosis not present

## 2015-09-28 DIAGNOSIS — Z940 Kidney transplant status: Secondary | ICD-10-CM | POA: Diagnosis not present

## 2015-09-28 DIAGNOSIS — E785 Hyperlipidemia, unspecified: Secondary | ICD-10-CM | POA: Diagnosis not present

## 2015-09-28 DIAGNOSIS — E139 Other specified diabetes mellitus without complications: Secondary | ICD-10-CM | POA: Diagnosis not present

## 2015-10-05 DIAGNOSIS — K219 Gastro-esophageal reflux disease without esophagitis: Secondary | ICD-10-CM | POA: Diagnosis not present

## 2015-10-05 DIAGNOSIS — Z6829 Body mass index (BMI) 29.0-29.9, adult: Secondary | ICD-10-CM | POA: Diagnosis not present

## 2015-10-05 DIAGNOSIS — I10 Essential (primary) hypertension: Secondary | ICD-10-CM | POA: Diagnosis not present

## 2015-10-05 DIAGNOSIS — Z940 Kidney transplant status: Secondary | ICD-10-CM | POA: Diagnosis not present

## 2015-10-05 DIAGNOSIS — E139 Other specified diabetes mellitus without complications: Secondary | ICD-10-CM | POA: Diagnosis not present

## 2015-12-01 DIAGNOSIS — E785 Hyperlipidemia, unspecified: Secondary | ICD-10-CM | POA: Diagnosis not present

## 2015-12-01 DIAGNOSIS — Z940 Kidney transplant status: Secondary | ICD-10-CM | POA: Diagnosis not present

## 2015-12-01 DIAGNOSIS — E139 Other specified diabetes mellitus without complications: Secondary | ICD-10-CM | POA: Diagnosis not present

## 2016-01-30 DIAGNOSIS — E785 Hyperlipidemia, unspecified: Secondary | ICD-10-CM | POA: Diagnosis not present

## 2016-01-30 DIAGNOSIS — Z940 Kidney transplant status: Secondary | ICD-10-CM | POA: Diagnosis not present

## 2016-02-06 DIAGNOSIS — H6121 Impacted cerumen, right ear: Secondary | ICD-10-CM | POA: Diagnosis not present

## 2016-02-06 DIAGNOSIS — Z940 Kidney transplant status: Secondary | ICD-10-CM | POA: Diagnosis not present

## 2016-02-06 DIAGNOSIS — J3089 Other allergic rhinitis: Secondary | ICD-10-CM | POA: Diagnosis not present

## 2016-02-08 DIAGNOSIS — E119 Type 2 diabetes mellitus without complications: Secondary | ICD-10-CM | POA: Diagnosis not present

## 2016-02-14 DIAGNOSIS — E139 Other specified diabetes mellitus without complications: Secondary | ICD-10-CM | POA: Diagnosis not present

## 2016-02-14 DIAGNOSIS — K219 Gastro-esophageal reflux disease without esophagitis: Secondary | ICD-10-CM | POA: Diagnosis not present

## 2016-02-14 DIAGNOSIS — E876 Hypokalemia: Secondary | ICD-10-CM | POA: Diagnosis not present

## 2016-02-14 DIAGNOSIS — Z940 Kidney transplant status: Secondary | ICD-10-CM | POA: Diagnosis not present

## 2016-02-14 DIAGNOSIS — Z6829 Body mass index (BMI) 29.0-29.9, adult: Secondary | ICD-10-CM | POA: Diagnosis not present

## 2016-02-14 DIAGNOSIS — E119 Type 2 diabetes mellitus without complications: Secondary | ICD-10-CM | POA: Diagnosis not present

## 2016-02-14 DIAGNOSIS — I10 Essential (primary) hypertension: Secondary | ICD-10-CM | POA: Diagnosis not present

## 2016-02-22 DIAGNOSIS — R634 Abnormal weight loss: Secondary | ICD-10-CM | POA: Diagnosis not present

## 2016-02-22 DIAGNOSIS — E1165 Type 2 diabetes mellitus with hyperglycemia: Secondary | ICD-10-CM | POA: Diagnosis not present

## 2016-02-22 DIAGNOSIS — Z794 Long term (current) use of insulin: Secondary | ICD-10-CM | POA: Diagnosis not present

## 2016-02-26 DIAGNOSIS — N186 End stage renal disease: Secondary | ICD-10-CM | POA: Diagnosis not present

## 2016-02-26 DIAGNOSIS — Z4822 Encounter for aftercare following kidney transplant: Secondary | ICD-10-CM | POA: Diagnosis not present

## 2016-02-26 DIAGNOSIS — I120 Hypertensive chronic kidney disease with stage 5 chronic kidney disease or end stage renal disease: Secondary | ICD-10-CM | POA: Diagnosis not present

## 2016-02-26 DIAGNOSIS — Z794 Long term (current) use of insulin: Secondary | ICD-10-CM | POA: Diagnosis not present

## 2016-02-26 DIAGNOSIS — Z79899 Other long term (current) drug therapy: Secondary | ICD-10-CM | POA: Diagnosis not present

## 2016-02-26 DIAGNOSIS — Z940 Kidney transplant status: Secondary | ICD-10-CM | POA: Diagnosis not present

## 2016-02-26 DIAGNOSIS — Z87440 Personal history of urinary (tract) infections: Secondary | ICD-10-CM | POA: Diagnosis not present

## 2016-02-26 DIAGNOSIS — Z886 Allergy status to analgesic agent status: Secondary | ICD-10-CM | POA: Diagnosis not present

## 2016-02-26 DIAGNOSIS — D899 Disorder involving the immune mechanism, unspecified: Secondary | ICD-10-CM | POA: Diagnosis not present

## 2016-02-26 DIAGNOSIS — Z7952 Long term (current) use of systemic steroids: Secondary | ICD-10-CM | POA: Diagnosis not present

## 2016-02-26 DIAGNOSIS — E891 Postprocedural hypoinsulinemia: Secondary | ICD-10-CM | POA: Diagnosis not present

## 2016-03-27 DIAGNOSIS — Z940 Kidney transplant status: Secondary | ICD-10-CM | POA: Diagnosis not present

## 2016-04-04 DIAGNOSIS — E1165 Type 2 diabetes mellitus with hyperglycemia: Secondary | ICD-10-CM | POA: Diagnosis not present

## 2016-04-04 DIAGNOSIS — Z794 Long term (current) use of insulin: Secondary | ICD-10-CM | POA: Diagnosis not present

## 2016-05-16 DIAGNOSIS — E1122 Type 2 diabetes mellitus with diabetic chronic kidney disease: Secondary | ICD-10-CM | POA: Diagnosis not present

## 2016-05-16 DIAGNOSIS — I10 Essential (primary) hypertension: Secondary | ICD-10-CM | POA: Diagnosis not present

## 2016-05-16 DIAGNOSIS — N185 Chronic kidney disease, stage 5: Secondary | ICD-10-CM | POA: Diagnosis not present

## 2016-05-16 DIAGNOSIS — Z0000 Encounter for general adult medical examination without abnormal findings: Secondary | ICD-10-CM | POA: Diagnosis not present

## 2016-05-16 DIAGNOSIS — Z79899 Other long term (current) drug therapy: Secondary | ICD-10-CM | POA: Diagnosis not present

## 2016-05-16 DIAGNOSIS — E785 Hyperlipidemia, unspecified: Secondary | ICD-10-CM | POA: Diagnosis not present

## 2016-05-20 DIAGNOSIS — E1122 Type 2 diabetes mellitus with diabetic chronic kidney disease: Secondary | ICD-10-CM | POA: Diagnosis not present

## 2016-05-29 DIAGNOSIS — Z940 Kidney transplant status: Secondary | ICD-10-CM | POA: Diagnosis not present

## 2016-06-05 DIAGNOSIS — E876 Hypokalemia: Secondary | ICD-10-CM | POA: Diagnosis not present

## 2016-06-05 DIAGNOSIS — I10 Essential (primary) hypertension: Secondary | ICD-10-CM | POA: Diagnosis not present

## 2016-06-05 DIAGNOSIS — E119 Type 2 diabetes mellitus without complications: Secondary | ICD-10-CM | POA: Diagnosis not present

## 2016-06-05 DIAGNOSIS — N186 End stage renal disease: Secondary | ICD-10-CM | POA: Diagnosis not present

## 2016-06-05 DIAGNOSIS — Z940 Kidney transplant status: Secondary | ICD-10-CM | POA: Diagnosis not present

## 2016-06-12 DIAGNOSIS — E0822 Diabetes mellitus due to underlying condition with diabetic chronic kidney disease: Secondary | ICD-10-CM | POA: Diagnosis not present

## 2016-06-12 DIAGNOSIS — Z79899 Other long term (current) drug therapy: Secondary | ICD-10-CM | POA: Diagnosis not present

## 2016-06-12 DIAGNOSIS — Z940 Kidney transplant status: Secondary | ICD-10-CM | POA: Diagnosis not present

## 2016-06-12 DIAGNOSIS — R110 Nausea: Secondary | ICD-10-CM | POA: Diagnosis not present

## 2016-06-12 DIAGNOSIS — N186 End stage renal disease: Secondary | ICD-10-CM | POA: Diagnosis not present

## 2016-06-12 DIAGNOSIS — K5732 Diverticulitis of large intestine without perforation or abscess without bleeding: Secondary | ICD-10-CM | POA: Diagnosis not present

## 2016-06-12 DIAGNOSIS — R630 Anorexia: Secondary | ICD-10-CM | POA: Diagnosis not present

## 2016-06-12 DIAGNOSIS — R112 Nausea with vomiting, unspecified: Secondary | ICD-10-CM | POA: Diagnosis not present

## 2016-06-12 DIAGNOSIS — Z87440 Personal history of urinary (tract) infections: Secondary | ICD-10-CM | POA: Diagnosis not present

## 2016-06-12 DIAGNOSIS — K5792 Diverticulitis of intestine, part unspecified, without perforation or abscess without bleeding: Secondary | ICD-10-CM | POA: Diagnosis not present

## 2016-06-12 DIAGNOSIS — I120 Hypertensive chronic kidney disease with stage 5 chronic kidney disease or end stage renal disease: Secondary | ICD-10-CM | POA: Diagnosis not present

## 2016-06-12 DIAGNOSIS — R1032 Left lower quadrant pain: Secondary | ICD-10-CM | POA: Diagnosis not present

## 2016-07-01 DIAGNOSIS — N390 Urinary tract infection, site not specified: Secondary | ICD-10-CM | POA: Diagnosis not present

## 2016-07-01 DIAGNOSIS — R3915 Urgency of urination: Secondary | ICD-10-CM | POA: Diagnosis not present

## 2016-07-01 DIAGNOSIS — E785 Hyperlipidemia, unspecified: Secondary | ICD-10-CM | POA: Diagnosis not present

## 2016-07-15 DIAGNOSIS — E1165 Type 2 diabetes mellitus with hyperglycemia: Secondary | ICD-10-CM | POA: Diagnosis not present

## 2016-07-15 DIAGNOSIS — N3000 Acute cystitis without hematuria: Secondary | ICD-10-CM | POA: Diagnosis not present

## 2016-08-23 DIAGNOSIS — E1165 Type 2 diabetes mellitus with hyperglycemia: Secondary | ICD-10-CM | POA: Diagnosis not present

## 2016-10-08 DIAGNOSIS — E876 Hypokalemia: Secondary | ICD-10-CM | POA: Diagnosis not present

## 2016-10-08 DIAGNOSIS — Z940 Kidney transplant status: Secondary | ICD-10-CM | POA: Diagnosis not present

## 2016-10-08 DIAGNOSIS — I10 Essential (primary) hypertension: Secondary | ICD-10-CM | POA: Diagnosis not present

## 2016-10-08 DIAGNOSIS — Z23 Encounter for immunization: Secondary | ICD-10-CM | POA: Diagnosis not present

## 2016-10-08 DIAGNOSIS — E119 Type 2 diabetes mellitus without complications: Secondary | ICD-10-CM | POA: Diagnosis not present

## 2016-10-16 DIAGNOSIS — Z940 Kidney transplant status: Secondary | ICD-10-CM | POA: Diagnosis not present

## 2016-10-31 DIAGNOSIS — N390 Urinary tract infection, site not specified: Secondary | ICD-10-CM | POA: Diagnosis not present

## 2016-10-31 DIAGNOSIS — E1165 Type 2 diabetes mellitus with hyperglycemia: Secondary | ICD-10-CM | POA: Diagnosis not present

## 2016-11-04 DIAGNOSIS — Z124 Encounter for screening for malignant neoplasm of cervix: Secondary | ICD-10-CM | POA: Diagnosis not present

## 2016-11-04 DIAGNOSIS — Z6827 Body mass index (BMI) 27.0-27.9, adult: Secondary | ICD-10-CM | POA: Diagnosis not present

## 2017-02-18 ENCOUNTER — Encounter: Payer: Self-pay | Admitting: Registered"

## 2017-02-18 ENCOUNTER — Encounter: Payer: Medicare Other | Attending: Family Medicine | Admitting: Registered"

## 2017-02-18 DIAGNOSIS — N189 Chronic kidney disease, unspecified: Secondary | ICD-10-CM | POA: Insufficient documentation

## 2017-02-18 DIAGNOSIS — Z713 Dietary counseling and surveillance: Secondary | ICD-10-CM | POA: Insufficient documentation

## 2017-02-18 DIAGNOSIS — E1122 Type 2 diabetes mellitus with diabetic chronic kidney disease: Secondary | ICD-10-CM | POA: Diagnosis not present

## 2017-02-18 DIAGNOSIS — E118 Type 2 diabetes mellitus with unspecified complications: Secondary | ICD-10-CM

## 2017-02-18 NOTE — Patient Instructions (Addendum)
Plan:  Aim for 2-3 Carb Choices per meal (30-45 grams)   Aim for 0-1 Carb Choices per snack if hungry (0-15 grams) Include protein with your meals and snacks Fairlife milk is a higher protein option that can help add protein to a meal that includes cereal. Aim for 3 balanced meals per day, with not to heavy of a supper meal and not too late Consider reading food labels for Total Carbohydrate and sodium and Saturated Fat Grams Consider increasing your activity level daily as tolerated Consider checking blood sugar at alternate times per day as directed by MD Continue taking medication as directed by MD Since you are having lows in the morning, think about taking insulin in the morning instead of the afternoon and be sure to have an afternoon snack and/or lunch You can get glucose tablets to keep in your purse to treat low blood sugar and remember to have protein once your blood sugar is in a good range.

## 2017-02-18 NOTE — Progress Notes (Signed)
Diabetes Self-Management Education  Visit Type: First/Initial  Appt. Start Time: 0915 Appt. End Time: 2542  02/18/2017  [name], identified by name and date of birth, is a 72 y.o. female with a diagnosis of Diabetes: Type 2.   ASSESSMENT Patient states she would like to learn how to eat and time her insulin Tyler Aas) to get her blood sugar at more even readings. Patient states she checks her BG before going to bed which is ~4 hrs since last meal and regularly sees numbers over 200. Patient states her morning numbers are usually lower and she often starts feeling low blood sugar symptoms when it is in 80s. Patient states she can tell when sugar is in the 110 range because she feels better and has more energy.   Per chart patient A1c was 7.7% last May then in August was 12.4%. Patient states her medications stopped working and she has been working with her PCP to find the right medication and dosage and it has been steadily starting to lower with most recent in Jan 2018 at 9.3%  Diabetes Self-Management Education - 02/18/17 0944      Visit Information   Visit Type  First/Initial      Initial Visit   Diabetes Type  Type 2    Are you currently following a meal plan?  No    Are you taking your medications as prescribed?  Yes    Date Diagnosed  2013 after kidney transplant      Health Coping   How would you rate your overall health?  Good      Psychosocial Assessment   Patient Belief/Attitude about Diabetes  Motivated to manage diabetes    How often do you need to have someone help you when you read instructions, pamphlets, or other written materials from your doctor or pharmacy?  1 - Never    What is the last grade level you completed in school?  12      Complications   Last HgB A1C per patient/outside source  9.3 % per referral labs    How often do you check your blood sugar?  1-2 times/day    Fasting Blood glucose range (mg/dL)  70-129;130-179    Postprandial Blood glucose  range (mg/dL)  180-200;>200 4 hrs after dinner    Number of hypoglycemic episodes per month  0 has symptoms in mid 80s    Number of hyperglycemic episodes per week  7    Can you tell when your blood sugar is high?  Yes    What do you do if your blood sugar is high?  feels sleepy, headache - drinks water, may walk a little     Have you had a dilated eye exam in the past 12 months?  Yes    Have you had a dental exam in the past 12 months?  Yes    Are you checking your feet?  Yes    How many days per week are you checking your feet?  7      Dietary Intake   Breakfast  egg, toast, peaches with nutrasweet OR toast, coffee, creamer    Snack (morning)  none if out OR grapes    Lunch  none OR 1/2 sandwich fruit, unsweet tea    Snack (afternoon)  none OR handful of grapes OR nuts    Dinner  fish, salad or potato, green beans, pinto beans or mac & cheese    Snack (evening)  none decaf coffee  OR cookie OR toast    Beverage(s)  water, coffee, diet soda, unsweet tea (no dark sodas due to kidney transplant)      Exercise   Exercise Type  Light (walking / raking leaves)    How many days per week to you exercise?  1    How many minutes per day do you exercise?  30    Total minutes per week of exercise  30      Patient Education   Previous Diabetes Education  No    Disease state   Definition of diabetes, type 1 and 2, and the diagnosis of diabetes;Factors that contribute to the development of diabetes    Nutrition management   Role of diet in the treatment of diabetes and the relationship between the three main macronutrients and blood glucose level;Carbohydrate counting;Food label reading, portion sizes and measuring food.;Meal timing in regards to the patients' current diabetes medication.;Effects of alcohol on blood glucose and safety factors with consumption of alcohol.    Physical activity and exercise   Role of exercise on diabetes management, blood pressure control and cardiac health.     Medications  Reviewed patients medication for diabetes, action, purpose, timing of dose and side effects.    Monitoring  Identified appropriate SMBG and/or A1C goals.    Acute complications  Taught treatment of hypoglycemia - the 15 rule.;Discussed and identified patients' treatment of hyperglycemia.    Chronic complications  Relationship between chronic complications and blood glucose control;Lipid levels, blood glucose control and heart disease      Individualized Goals (developed by patient)   Nutrition  General guidelines for healthy choices and portions discussed    Monitoring   test my blood glucose as discussed      Outcomes   Expected Outcomes  Demonstrated interest in learning. Expect positive outcomes    Future DMSE  PRN    Program Status  Completed     Individualized Plan for Diabetes Self-Management Training:   Learning Objective:  Patient will have a greater understanding of diabetes self-management. Patient education plan is to attend individual and/or group sessions per assessed needs and concerns.  Patient Instructions  Plan:  Aim for 2-3 Carb Choices per meal (30-45 grams)   Aim for 0-1 Carb Choices per snack if hungry (0-15 grams) Include protein with your meals and snacks Fairlife milk is a higher protein option that can help add protein to a meal that includes cereal. Aim for 3 balanced meals per day, with not to heavy of a supper meal and not too late Consider reading food labels for Total Carbohydrate and sodium and Saturated Fat Grams Consider increasing your activity level daily as tolerated Consider checking blood sugar at alternate times per day as directed by MD Continue taking medication as directed by MD Since you are having lows in the morning, think about taking insulin in the morning instead of the afternoon and be sure to have an afternoon snack and/or lunch You can get glucose tablets to keep in your purse to treat low blood sugar and remember to have  protein once your blood sugar is in a good range.  Expected Outcomes:  Demonstrated interest in learning. Expect positive outcomes  Education material provided: Living Well with Diabetes, A1C conversion sheet, My Plate and Carbohydrate counting sheet  If problems or questions, patient to contact team via:  Phone  Future DSME appointment: PRN

## 2017-02-25 DIAGNOSIS — Z87440 Personal history of urinary (tract) infections: Secondary | ICD-10-CM | POA: Diagnosis not present

## 2017-02-25 DIAGNOSIS — Z79899 Other long term (current) drug therapy: Secondary | ICD-10-CM | POA: Diagnosis not present

## 2017-02-25 DIAGNOSIS — N186 End stage renal disease: Secondary | ICD-10-CM | POA: Diagnosis not present

## 2017-02-25 DIAGNOSIS — E088 Diabetes mellitus due to underlying condition with unspecified complications: Secondary | ICD-10-CM | POA: Diagnosis not present

## 2017-02-25 DIAGNOSIS — R600 Localized edema: Secondary | ICD-10-CM | POA: Diagnosis not present

## 2017-02-25 DIAGNOSIS — D8989 Other specified disorders involving the immune mechanism, not elsewhere classified: Secondary | ICD-10-CM | POA: Diagnosis not present

## 2017-02-25 DIAGNOSIS — E1122 Type 2 diabetes mellitus with diabetic chronic kidney disease: Secondary | ICD-10-CM | POA: Diagnosis not present

## 2017-02-25 DIAGNOSIS — Z992 Dependence on renal dialysis: Secondary | ICD-10-CM | POA: Diagnosis not present

## 2017-02-25 DIAGNOSIS — Z940 Kidney transplant status: Secondary | ICD-10-CM | POA: Diagnosis not present

## 2017-02-25 DIAGNOSIS — B259 Cytomegaloviral disease, unspecified: Secondary | ICD-10-CM | POA: Diagnosis not present

## 2017-02-25 DIAGNOSIS — I10 Essential (primary) hypertension: Secondary | ICD-10-CM | POA: Diagnosis not present

## 2017-02-25 DIAGNOSIS — I120 Hypertensive chronic kidney disease with stage 5 chronic kidney disease or end stage renal disease: Secondary | ICD-10-CM | POA: Diagnosis not present

## 2017-03-17 DIAGNOSIS — Z794 Long term (current) use of insulin: Secondary | ICD-10-CM | POA: Diagnosis not present

## 2017-03-17 DIAGNOSIS — E1165 Type 2 diabetes mellitus with hyperglycemia: Secondary | ICD-10-CM | POA: Diagnosis not present

## 2017-03-17 DIAGNOSIS — N3000 Acute cystitis without hematuria: Secondary | ICD-10-CM | POA: Diagnosis not present

## 2017-03-20 DIAGNOSIS — E119 Type 2 diabetes mellitus without complications: Secondary | ICD-10-CM | POA: Diagnosis not present

## 2017-03-21 DIAGNOSIS — Z1231 Encounter for screening mammogram for malignant neoplasm of breast: Secondary | ICD-10-CM | POA: Diagnosis not present

## 2017-03-26 DIAGNOSIS — H25812 Combined forms of age-related cataract, left eye: Secondary | ICD-10-CM | POA: Diagnosis not present

## 2017-03-26 DIAGNOSIS — Z01818 Encounter for other preprocedural examination: Secondary | ICD-10-CM | POA: Diagnosis not present

## 2017-04-03 DIAGNOSIS — H2512 Age-related nuclear cataract, left eye: Secondary | ICD-10-CM | POA: Diagnosis not present

## 2017-04-03 DIAGNOSIS — H2513 Age-related nuclear cataract, bilateral: Secondary | ICD-10-CM | POA: Diagnosis not present

## 2017-04-03 DIAGNOSIS — H25812 Combined forms of age-related cataract, left eye: Secondary | ICD-10-CM | POA: Diagnosis not present

## 2017-04-17 DIAGNOSIS — H2513 Age-related nuclear cataract, bilateral: Secondary | ICD-10-CM | POA: Diagnosis not present

## 2017-04-17 DIAGNOSIS — H25811 Combined forms of age-related cataract, right eye: Secondary | ICD-10-CM | POA: Diagnosis not present

## 2017-04-17 DIAGNOSIS — H2511 Age-related nuclear cataract, right eye: Secondary | ICD-10-CM | POA: Diagnosis not present

## 2017-06-03 DIAGNOSIS — E876 Hypokalemia: Secondary | ICD-10-CM | POA: Diagnosis not present

## 2017-06-03 DIAGNOSIS — E119 Type 2 diabetes mellitus without complications: Secondary | ICD-10-CM | POA: Diagnosis not present

## 2017-06-03 DIAGNOSIS — I10 Essential (primary) hypertension: Secondary | ICD-10-CM | POA: Diagnosis not present

## 2017-06-03 DIAGNOSIS — H269 Unspecified cataract: Secondary | ICD-10-CM | POA: Diagnosis not present

## 2017-06-03 DIAGNOSIS — Z940 Kidney transplant status: Secondary | ICD-10-CM | POA: Diagnosis not present

## 2017-06-29 DIAGNOSIS — R197 Diarrhea, unspecified: Secondary | ICD-10-CM | POA: Diagnosis not present

## 2017-06-29 DIAGNOSIS — N3000 Acute cystitis without hematuria: Secondary | ICD-10-CM | POA: Diagnosis not present

## 2017-06-29 DIAGNOSIS — R1030 Lower abdominal pain, unspecified: Secondary | ICD-10-CM | POA: Diagnosis not present

## 2017-06-29 DIAGNOSIS — R112 Nausea with vomiting, unspecified: Secondary | ICD-10-CM | POA: Diagnosis not present

## 2017-07-07 DIAGNOSIS — E1122 Type 2 diabetes mellitus with diabetic chronic kidney disease: Secondary | ICD-10-CM | POA: Diagnosis not present

## 2017-07-07 DIAGNOSIS — N390 Urinary tract infection, site not specified: Secondary | ICD-10-CM | POA: Diagnosis not present

## 2017-07-07 DIAGNOSIS — Z794 Long term (current) use of insulin: Secondary | ICD-10-CM | POA: Diagnosis not present

## 2017-07-07 DIAGNOSIS — E876 Hypokalemia: Secondary | ICD-10-CM | POA: Diagnosis not present

## 2017-07-07 DIAGNOSIS — Z940 Kidney transplant status: Secondary | ICD-10-CM | POA: Diagnosis not present

## 2017-07-16 DIAGNOSIS — N390 Urinary tract infection, site not specified: Secondary | ICD-10-CM | POA: Diagnosis not present

## 2017-07-16 DIAGNOSIS — E785 Hyperlipidemia, unspecified: Secondary | ICD-10-CM | POA: Diagnosis not present

## 2017-07-16 DIAGNOSIS — F418 Other specified anxiety disorders: Secondary | ICD-10-CM | POA: Diagnosis not present

## 2017-07-16 DIAGNOSIS — Z23 Encounter for immunization: Secondary | ICD-10-CM | POA: Diagnosis not present

## 2017-07-16 DIAGNOSIS — Z79899 Other long term (current) drug therapy: Secondary | ICD-10-CM | POA: Diagnosis not present

## 2017-07-16 DIAGNOSIS — E1122 Type 2 diabetes mellitus with diabetic chronic kidney disease: Secondary | ICD-10-CM | POA: Diagnosis not present

## 2017-07-16 DIAGNOSIS — Z0000 Encounter for general adult medical examination without abnormal findings: Secondary | ICD-10-CM | POA: Diagnosis not present

## 2017-07-16 DIAGNOSIS — Z940 Kidney transplant status: Secondary | ICD-10-CM | POA: Diagnosis not present

## 2017-07-16 DIAGNOSIS — N185 Chronic kidney disease, stage 5: Secondary | ICD-10-CM | POA: Diagnosis not present

## 2017-07-16 DIAGNOSIS — N2581 Secondary hyperparathyroidism of renal origin: Secondary | ICD-10-CM | POA: Diagnosis not present

## 2017-07-16 DIAGNOSIS — I10 Essential (primary) hypertension: Secondary | ICD-10-CM | POA: Diagnosis not present

## 2017-07-16 DIAGNOSIS — J309 Allergic rhinitis, unspecified: Secondary | ICD-10-CM | POA: Diagnosis not present

## 2017-08-04 DIAGNOSIS — D229 Melanocytic nevi, unspecified: Secondary | ICD-10-CM | POA: Diagnosis not present

## 2017-08-04 DIAGNOSIS — L821 Other seborrheic keratosis: Secondary | ICD-10-CM | POA: Diagnosis not present

## 2017-09-30 DIAGNOSIS — E1165 Type 2 diabetes mellitus with hyperglycemia: Secondary | ICD-10-CM | POA: Diagnosis not present

## 2017-09-30 DIAGNOSIS — R404 Transient alteration of awareness: Secondary | ICD-10-CM | POA: Diagnosis not present

## 2017-09-30 DIAGNOSIS — Z794 Long term (current) use of insulin: Secondary | ICD-10-CM | POA: Diagnosis not present

## 2017-09-30 DIAGNOSIS — N185 Chronic kidney disease, stage 5: Secondary | ICD-10-CM | POA: Diagnosis not present

## 2017-09-30 DIAGNOSIS — Z23 Encounter for immunization: Secondary | ICD-10-CM | POA: Diagnosis not present

## 2017-09-30 DIAGNOSIS — E1122 Type 2 diabetes mellitus with diabetic chronic kidney disease: Secondary | ICD-10-CM | POA: Diagnosis not present

## 2017-10-03 DIAGNOSIS — N390 Urinary tract infection, site not specified: Secondary | ICD-10-CM | POA: Diagnosis not present

## 2017-10-03 DIAGNOSIS — R350 Frequency of micturition: Secondary | ICD-10-CM | POA: Diagnosis not present

## 2017-10-07 DIAGNOSIS — R635 Abnormal weight gain: Secondary | ICD-10-CM | POA: Diagnosis not present

## 2017-10-07 DIAGNOSIS — Z940 Kidney transplant status: Secondary | ICD-10-CM | POA: Diagnosis not present

## 2017-10-07 DIAGNOSIS — E119 Type 2 diabetes mellitus without complications: Secondary | ICD-10-CM | POA: Diagnosis not present

## 2017-10-09 DIAGNOSIS — H269 Unspecified cataract: Secondary | ICD-10-CM | POA: Diagnosis not present

## 2017-10-09 DIAGNOSIS — E876 Hypokalemia: Secondary | ICD-10-CM | POA: Diagnosis not present

## 2017-10-09 DIAGNOSIS — E119 Type 2 diabetes mellitus without complications: Secondary | ICD-10-CM | POA: Diagnosis not present

## 2017-10-09 DIAGNOSIS — Z940 Kidney transplant status: Secondary | ICD-10-CM | POA: Diagnosis not present

## 2017-10-09 DIAGNOSIS — I10 Essential (primary) hypertension: Secondary | ICD-10-CM | POA: Diagnosis not present

## 2017-10-14 ENCOUNTER — Encounter: Payer: Self-pay | Admitting: Neurology

## 2017-10-21 DIAGNOSIS — E119 Type 2 diabetes mellitus without complications: Secondary | ICD-10-CM | POA: Diagnosis not present

## 2017-10-21 DIAGNOSIS — N390 Urinary tract infection, site not specified: Secondary | ICD-10-CM | POA: Diagnosis not present

## 2017-11-04 DIAGNOSIS — Z940 Kidney transplant status: Secondary | ICD-10-CM | POA: Diagnosis not present

## 2017-11-04 DIAGNOSIS — Z6828 Body mass index (BMI) 28.0-28.9, adult: Secondary | ICD-10-CM | POA: Diagnosis not present

## 2017-11-04 DIAGNOSIS — E209 Hypoparathyroidism, unspecified: Secondary | ICD-10-CM | POA: Diagnosis not present

## 2017-11-04 DIAGNOSIS — R635 Abnormal weight gain: Secondary | ICD-10-CM | POA: Diagnosis not present

## 2017-11-04 DIAGNOSIS — E119 Type 2 diabetes mellitus without complications: Secondary | ICD-10-CM | POA: Diagnosis not present

## 2017-11-05 DIAGNOSIS — Z6827 Body mass index (BMI) 27.0-27.9, adult: Secondary | ICD-10-CM | POA: Diagnosis not present

## 2017-11-05 DIAGNOSIS — Z01419 Encounter for gynecological examination (general) (routine) without abnormal findings: Secondary | ICD-10-CM | POA: Diagnosis not present

## 2017-11-05 DIAGNOSIS — N390 Urinary tract infection, site not specified: Secondary | ICD-10-CM | POA: Diagnosis not present

## 2017-12-24 ENCOUNTER — Encounter: Payer: Self-pay | Admitting: Neurology

## 2017-12-24 ENCOUNTER — Ambulatory Visit (INDEPENDENT_AMBULATORY_CARE_PROVIDER_SITE_OTHER): Payer: Medicare Other | Admitting: Neurology

## 2017-12-24 VITALS — BP 164/78 | HR 98 | Ht 59.5 in | Wt 140.0 lb

## 2017-12-24 DIAGNOSIS — E118 Type 2 diabetes mellitus with unspecified complications: Secondary | ICD-10-CM

## 2017-12-24 DIAGNOSIS — Z8249 Family history of ischemic heart disease and other diseases of the circulatory system: Secondary | ICD-10-CM

## 2017-12-24 DIAGNOSIS — I10 Essential (primary) hypertension: Secondary | ICD-10-CM

## 2017-12-24 DIAGNOSIS — R519 Headache, unspecified: Secondary | ICD-10-CM

## 2017-12-24 DIAGNOSIS — R51 Headache: Secondary | ICD-10-CM

## 2017-12-24 DIAGNOSIS — F22 Delusional disorders: Secondary | ICD-10-CM

## 2017-12-24 NOTE — Patient Instructions (Addendum)
1. Schedule open MRI brain without contrast 2. Schedule MRA head without contrast  We have sent a referral for your OPEN MRI/MRA to [facility].  They will contact you directly to schedule your appointment.  [facility] is located at [address], [place], [place] [address].  If you need to speak with [facility] for any reason, they can be reached at [phone]  3. Schedule 1-hour EEG, then 48-hour EEG 4. Follow-up after tests, call for any changes
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
## 2017-12-26 ENCOUNTER — Encounter: Payer: Self-pay | Admitting: Neurology

## 2017-12-31 ENCOUNTER — Ambulatory Visit (INDEPENDENT_AMBULATORY_CARE_PROVIDER_SITE_OTHER): Payer: Medicare Other | Admitting: Neurology

## 2017-12-31 DIAGNOSIS — R442 Other hallucinations: Secondary | ICD-10-CM | POA: Diagnosis not present

## 2017-12-31 DIAGNOSIS — Z8249 Family history of ischemic heart disease and other diseases of the circulatory system: Secondary | ICD-10-CM | POA: Diagnosis not present

## 2018-01-09 NOTE — Procedures (Signed)
ELECTROENCEPHALOGRAM REPORT  Date of Study: 12/31/2017  Patient's Name: [name] MRN: [id number] Date of Birth: August 21, 1945  Referring Provider: Dr. Ellouise Newer  Clinical History: This is a 72 year old woman with olfactory hallucinations followed by drowsiness. EEG for classification.  Medications: LIPITOR 20 MG tablet  HYGROTON 25 MG tablet  CATAPRES 0.1 MG tablet   VIVELLE-DOT 0.025 MG/24HR  TRESIBA FLEXTOUCH [place]  MAG-OX 400 MG tablet  MYFORTIC)180 MG EC tablet   PYRIDIUM 200 MG tablet  K-DUR,KLOR-CON 20 MEQ tablet  DELTASONE 5 MG tablet   BACTRIM,SEPTRA 400-80 MG tablet  PROGRAF 1 MG capsule  FLONASE 50 MCG/ACT nasal spray  RENVELA 800 MG tablet   Technical Summary: A multichannel digital 1-hour EEG recording measured by the international 10-20 system with electrodes applied with paste and impedances below 5000 ohms performed in our laboratory with EKG monitoring in an awake and asleep patient.  Hyperventilation and photic stimulation were performed.  The digital EEG was referentially recorded, reformatted, and digitally filtered in a variety of bipolar and referential montages for optimal display.    Description: The patient is awake and asleep during the recording.  During maximal wakefulness, there is a symmetric, medium voltage 9-9.5 Hz posterior dominant rhythm that attenuates with eye opening.  The record is symmetric.  During drowsiness and sleep, there is an increase in theta slowing of the background.  Vertex waves and symmetric sleep spindles were seen.  Hyperventilation and photic stimulation did not elicit any abnormalities.  There were no epileptiform discharges or electrographic seizures seen.    EKG lead showed sinus bradycardia at 54 bpm.  Impression: This 1-hour awake and asleep EEG is normal.    Clinical Correlation: A normal EEG does not exclude a clinical diagnosis of epilepsy.  If further clinical questions remain, prolonged EEG may be  helpful.  Clinical correlation is advised.   Ellouise Newer, M.D.

## 2018-01-13 ENCOUNTER — Telehealth: Payer: Self-pay

## 2018-01-13 NOTE — Telephone Encounter (Signed)
Spoke with pt relaying message below.  amb EEG already scheduled for 01/26/2018

## 2018-01-13 NOTE — Telephone Encounter (Signed)
-----   Message from Cameron Sprang, MD sent at 01/09/2018  9:06 AM EST ----- Pls let her know the 1-hour brain wave test was normal, proceed with 48-hour EEG as we had discussed so we can find out more information about her symptoms. Thanks

## 2018-01-22 DIAGNOSIS — Z6828 Body mass index (BMI) 28.0-28.9, adult: Secondary | ICD-10-CM | POA: Diagnosis not present

## 2018-01-22 DIAGNOSIS — Z940 Kidney transplant status: Secondary | ICD-10-CM | POA: Diagnosis not present

## 2018-01-22 DIAGNOSIS — E119 Type 2 diabetes mellitus without complications: Secondary | ICD-10-CM | POA: Diagnosis not present

## 2018-01-22 DIAGNOSIS — I10 Essential (primary) hypertension: Secondary | ICD-10-CM | POA: Diagnosis not present

## 2018-01-22 DIAGNOSIS — R635 Abnormal weight gain: Secondary | ICD-10-CM | POA: Diagnosis not present

## 2018-01-26 ENCOUNTER — Ambulatory Visit (INDEPENDENT_AMBULATORY_CARE_PROVIDER_SITE_OTHER): Payer: Medicare Other | Admitting: Neurology

## 2018-01-26 DIAGNOSIS — Z8249 Family history of ischemic heart disease and other diseases of the circulatory system: Secondary | ICD-10-CM

## 2018-02-03 ENCOUNTER — Telehealth: Payer: Self-pay | Admitting: Neurology

## 2018-02-03 NOTE — Procedures (Signed)
ELECTROENCEPHALOGRAM REPORT  Dates of Recording: 01/26/2018 9:37AM to 01/28/2018 10:00AM  Patient's Name: [name] MRN: [id number] Date of Birth: 07-01-1945  Referring Provider: Dr. Ellouise Newer  Procedure: 48-hour ambulatory EEG  History: This is a 73 year old woman with olfactory hallucinations, paranoia. EEG for classification.  Medications:  LIPITOR 20 MG tablet  HYGROTON 25 MG tablet  CATAPRES 0.1 MG tablet  VIVELLE-DOT 0.025 MG/24HR   TRESIBA FLEXTOUCH [place] Inject 24 Units  MAG-OX 400 MG tablet  MYFORTIC 180 MG EC tablet  PYRIDIUM 200 MG tablet  K-DUR,KLOR-CON 20 MEQ tablet  DELTASONE 5 MG tablet  BACTRIM,SEPTRA 400-80 MG tablet  PROGRAF 1 MG capsule   FLONASE 50 MCG/ACT nasal spray  RENVELA 800 MG tablet   Technical Summary: This is a 48-hour multichannel digital EEG recording with video measured by the international 10-20 system with electrodes applied with paste and impedances below 5000 ohms performed as portable with EKG monitoring.  The digital EEG was referentially recorded, reformatted, and digitally filtered in a variety of bipolar and referential montages for optimal display.    DESCRIPTION OF RECORDING: During maximal wakefulness, the background activity consisted of a symmetric 9 Hz posterior dominant rhythm which was reactive to eye opening.  There were no epileptiform discharges or focal slowing seen in wakefulness.  During the recording, the patient progresses through wakefulness, drowsiness, and Stage 2 sleep.  Again, there were no epileptiform discharges seen.  Events:  On 1/13 at 1741 hours, she feels sick on her stomach. Electrographically, there were no EEG or EKG changes seen.  On 1/14 at 1013 hours, she pushes event button. No symptoms reported on diary. She is not on video. Electrographically, there were no EEG or EKG changes seen.  On 1/14 at 1139 hours, she pushes event button. No symptoms reported on diary. She is sitting on the  couch writing, no clinical changes seen. Electrographically, there were no EEG or EKG changes seen.  On 1/14 at 1344 and 1345 hours, she pushes event button. No symptoms reported on diary. She is not on video. Electrographically, there were no EEG or EKG changes seen.  On 1/14 at 1515 hours, she has a bad headache. Electrographically, there were no EEG or EKG changes seen.  There were no electrographic seizures seen.  EKG lead was unremarkable.  IMPRESSION: This 48-hour ambulatory EEG study is normal.    CLINICAL CORRELATION: A normal EEG does not exclude a clinical diagnosis of epilepsy. Episodes of feeling sick to stomach and headache did not show EEG correlate. Typical episodes of smelling gas and falling asleep were not reported.  If further clinical questions remain, inpatient video EEG monitoring may be helpful.   Ellouise Newer, M.D.

## 2018-02-03 NOTE — Telephone Encounter (Signed)
Forwarded to Luiz Iron for review

## 2018-02-03 NOTE — Telephone Encounter (Signed)
Patient is calling in about a bill. Stating that the doctor labeled the visit as a wellness visit and her insurance said they do not cover that. The bill was for almost 400$. This is for the 12/24/17 appt. She said that she didn't need to go through the billing dept as this is something from the office...? Please call her back or update. Thanks!

## 2018-02-09 DIAGNOSIS — Z8673 Personal history of transient ischemic attack (TIA), and cerebral infarction without residual deficits: Secondary | ICD-10-CM | POA: Diagnosis not present

## 2018-02-09 DIAGNOSIS — Z8249 Family history of ischemic heart disease and other diseases of the circulatory system: Secondary | ICD-10-CM | POA: Diagnosis not present

## 2018-02-10 ENCOUNTER — Telehealth: Payer: Self-pay | Admitting: Neurology

## 2018-02-10 NOTE — Telephone Encounter (Signed)
MRI brain without contrast done 02/09/2018 results reviewed, no acute changes. 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 There was joint effusion in the right atlantooccipital articulation. MRA negative for aneurysm.

## 2018-02-11 NOTE — Telephone Encounter (Signed)
Spoke with pt relaying normal EEG and MRI results.  Advised that Dr. Delice Lesch recommends Neurocognitive testing at this point.  Pt states that she does not want to proceed with testing at this time.  Stating "Dr. Delice Lesch didn't find anything.  I will just leave well enough alone"

## 2018-02-25 DIAGNOSIS — E138 Other specified diabetes mellitus with unspecified complications: Secondary | ICD-10-CM | POA: Diagnosis not present

## 2018-02-25 DIAGNOSIS — Z794 Long term (current) use of insulin: Secondary | ICD-10-CM | POA: Diagnosis not present

## 2018-02-25 DIAGNOSIS — E891 Postprocedural hypoinsulinemia: Secondary | ICD-10-CM | POA: Diagnosis not present

## 2018-02-25 DIAGNOSIS — D8989 Other specified disorders involving the immune mechanism, not elsewhere classified: Secondary | ICD-10-CM | POA: Diagnosis not present

## 2018-02-25 DIAGNOSIS — Z7952 Long term (current) use of systemic steroids: Secondary | ICD-10-CM | POA: Diagnosis not present

## 2018-02-25 DIAGNOSIS — Z4822 Encounter for aftercare following kidney transplant: Secondary | ICD-10-CM | POA: Diagnosis not present

## 2018-02-25 DIAGNOSIS — Z79899 Other long term (current) drug therapy: Secondary | ICD-10-CM | POA: Diagnosis not present

## 2018-02-25 DIAGNOSIS — Z940 Kidney transplant status: Secondary | ICD-10-CM | POA: Diagnosis not present

## 2018-02-25 DIAGNOSIS — I10 Essential (primary) hypertension: Secondary | ICD-10-CM | POA: Diagnosis not present

## 2018-02-25 DIAGNOSIS — Z792 Long term (current) use of antibiotics: Secondary | ICD-10-CM | POA: Diagnosis not present

## 2018-02-25 DIAGNOSIS — N390 Urinary tract infection, site not specified: Secondary | ICD-10-CM | POA: Diagnosis not present

## 2018-03-16 DIAGNOSIS — Z4822 Encounter for aftercare following kidney transplant: Secondary | ICD-10-CM | POA: Diagnosis not present

## 2018-03-16 DIAGNOSIS — Z940 Kidney transplant status: Secondary | ICD-10-CM | POA: Diagnosis not present

## 2018-03-23 DIAGNOSIS — Z940 Kidney transplant status: Secondary | ICD-10-CM | POA: Diagnosis not present

## 2018-03-23 DIAGNOSIS — Z6828 Body mass index (BMI) 28.0-28.9, adult: Secondary | ICD-10-CM | POA: Diagnosis not present

## 2018-03-23 DIAGNOSIS — R635 Abnormal weight gain: Secondary | ICD-10-CM | POA: Diagnosis not present

## 2018-03-23 DIAGNOSIS — E119 Type 2 diabetes mellitus without complications: Secondary | ICD-10-CM | POA: Diagnosis not present

## 2018-03-23 DIAGNOSIS — I10 Essential (primary) hypertension: Secondary | ICD-10-CM | POA: Diagnosis not present

## 2018-04-03 DIAGNOSIS — R300 Dysuria: Secondary | ICD-10-CM | POA: Diagnosis not present

## 2018-04-03 DIAGNOSIS — R309 Painful micturition, unspecified: Secondary | ICD-10-CM | POA: Diagnosis not present

## 2018-04-03 DIAGNOSIS — N760 Acute vaginitis: Secondary | ICD-10-CM | POA: Diagnosis not present

## 2018-04-15 DIAGNOSIS — E119 Type 2 diabetes mellitus without complications: Secondary | ICD-10-CM | POA: Diagnosis not present

## 2018-04-15 DIAGNOSIS — N390 Urinary tract infection, site not specified: Secondary | ICD-10-CM | POA: Diagnosis not present

## 2018-04-15 DIAGNOSIS — E876 Hypokalemia: Secondary | ICD-10-CM | POA: Diagnosis not present

## 2018-04-15 DIAGNOSIS — Z940 Kidney transplant status: Secondary | ICD-10-CM | POA: Diagnosis not present

## 2018-04-15 DIAGNOSIS — I10 Essential (primary) hypertension: Secondary | ICD-10-CM | POA: Diagnosis not present

## 2018-05-05 DIAGNOSIS — N390 Urinary tract infection, site not specified: Secondary | ICD-10-CM | POA: Diagnosis not present

## 2018-05-29 DIAGNOSIS — N390 Urinary tract infection, site not specified: Secondary | ICD-10-CM | POA: Diagnosis not present

## 2018-06-03 DIAGNOSIS — Z87440 Personal history of urinary (tract) infections: Secondary | ICD-10-CM | POA: Diagnosis not present

## 2018-06-03 DIAGNOSIS — N390 Urinary tract infection, site not specified: Secondary | ICD-10-CM | POA: Diagnosis not present

## 2018-06-03 DIAGNOSIS — E785 Hyperlipidemia, unspecified: Secondary | ICD-10-CM | POA: Diagnosis not present

## 2018-06-03 DIAGNOSIS — Z79899 Other long term (current) drug therapy: Secondary | ICD-10-CM | POA: Diagnosis not present

## 2018-06-03 DIAGNOSIS — Z992 Dependence on renal dialysis: Secondary | ICD-10-CM | POA: Diagnosis not present

## 2018-06-03 DIAGNOSIS — K219 Gastro-esophageal reflux disease without esophagitis: Secondary | ICD-10-CM | POA: Diagnosis not present

## 2018-06-03 DIAGNOSIS — N2581 Secondary hyperparathyroidism of renal origin: Secondary | ICD-10-CM | POA: Diagnosis not present

## 2018-06-03 DIAGNOSIS — Z7952 Long term (current) use of systemic steroids: Secondary | ICD-10-CM | POA: Diagnosis not present

## 2018-06-03 DIAGNOSIS — E139 Other specified diabetes mellitus without complications: Secondary | ICD-10-CM | POA: Diagnosis not present

## 2018-06-03 DIAGNOSIS — E876 Hypokalemia: Secondary | ICD-10-CM | POA: Diagnosis not present

## 2018-06-03 DIAGNOSIS — Z9181 History of falling: Secondary | ICD-10-CM | POA: Diagnosis not present

## 2018-06-03 DIAGNOSIS — Z940 Kidney transplant status: Secondary | ICD-10-CM | POA: Diagnosis not present

## 2018-06-03 DIAGNOSIS — I10 Essential (primary) hypertension: Secondary | ICD-10-CM | POA: Diagnosis not present

## 2018-06-03 DIAGNOSIS — N186 End stage renal disease: Secondary | ICD-10-CM | POA: Diagnosis not present

## 2018-06-05 DIAGNOSIS — E139 Other specified diabetes mellitus without complications: Secondary | ICD-10-CM | POA: Diagnosis not present

## 2018-06-05 DIAGNOSIS — E876 Hypokalemia: Secondary | ICD-10-CM | POA: Diagnosis not present

## 2018-06-05 DIAGNOSIS — I10 Essential (primary) hypertension: Secondary | ICD-10-CM | POA: Diagnosis not present

## 2018-06-05 DIAGNOSIS — N186 End stage renal disease: Secondary | ICD-10-CM | POA: Diagnosis not present

## 2018-06-05 DIAGNOSIS — N390 Urinary tract infection, site not specified: Secondary | ICD-10-CM | POA: Diagnosis not present

## 2018-06-06 DIAGNOSIS — N186 End stage renal disease: Secondary | ICD-10-CM | POA: Diagnosis not present

## 2018-06-06 DIAGNOSIS — E876 Hypokalemia: Secondary | ICD-10-CM | POA: Diagnosis not present

## 2018-06-06 DIAGNOSIS — I10 Essential (primary) hypertension: Secondary | ICD-10-CM | POA: Diagnosis not present

## 2018-06-06 DIAGNOSIS — N390 Urinary tract infection, site not specified: Secondary | ICD-10-CM | POA: Diagnosis not present

## 2018-06-06 DIAGNOSIS — E139 Other specified diabetes mellitus without complications: Secondary | ICD-10-CM | POA: Diagnosis not present

## 2018-06-07 DIAGNOSIS — I10 Essential (primary) hypertension: Secondary | ICD-10-CM | POA: Diagnosis not present

## 2018-06-07 DIAGNOSIS — E876 Hypokalemia: Secondary | ICD-10-CM | POA: Diagnosis not present

## 2018-06-07 DIAGNOSIS — N390 Urinary tract infection, site not specified: Secondary | ICD-10-CM | POA: Diagnosis not present

## 2018-06-07 DIAGNOSIS — E139 Other specified diabetes mellitus without complications: Secondary | ICD-10-CM | POA: Diagnosis not present

## 2018-06-07 DIAGNOSIS — N186 End stage renal disease: Secondary | ICD-10-CM | POA: Diagnosis not present

## 2018-06-09 DIAGNOSIS — E876 Hypokalemia: Secondary | ICD-10-CM | POA: Diagnosis not present

## 2018-06-09 DIAGNOSIS — I10 Essential (primary) hypertension: Secondary | ICD-10-CM | POA: Diagnosis not present

## 2018-06-09 DIAGNOSIS — N390 Urinary tract infection, site not specified: Secondary | ICD-10-CM | POA: Diagnosis not present

## 2018-06-09 DIAGNOSIS — E139 Other specified diabetes mellitus without complications: Secondary | ICD-10-CM | POA: Diagnosis not present

## 2018-06-09 DIAGNOSIS — N186 End stage renal disease: Secondary | ICD-10-CM | POA: Diagnosis not present

## 2018-06-10 DIAGNOSIS — I10 Essential (primary) hypertension: Secondary | ICD-10-CM | POA: Diagnosis not present

## 2018-06-10 DIAGNOSIS — E139 Other specified diabetes mellitus without complications: Secondary | ICD-10-CM | POA: Diagnosis not present

## 2018-06-10 DIAGNOSIS — N186 End stage renal disease: Secondary | ICD-10-CM | POA: Diagnosis not present

## 2018-06-10 DIAGNOSIS — E876 Hypokalemia: Secondary | ICD-10-CM | POA: Diagnosis not present

## 2018-06-10 DIAGNOSIS — N390 Urinary tract infection, site not specified: Secondary | ICD-10-CM | POA: Diagnosis not present

## 2018-06-11 DIAGNOSIS — I10 Essential (primary) hypertension: Secondary | ICD-10-CM | POA: Diagnosis not present

## 2018-06-11 DIAGNOSIS — E139 Other specified diabetes mellitus without complications: Secondary | ICD-10-CM | POA: Diagnosis not present

## 2018-06-11 DIAGNOSIS — E876 Hypokalemia: Secondary | ICD-10-CM | POA: Diagnosis not present

## 2018-06-11 DIAGNOSIS — N390 Urinary tract infection, site not specified: Secondary | ICD-10-CM | POA: Diagnosis not present

## 2018-06-11 DIAGNOSIS — N186 End stage renal disease: Secondary | ICD-10-CM | POA: Diagnosis not present

## 2018-06-12 DIAGNOSIS — N186 End stage renal disease: Secondary | ICD-10-CM | POA: Diagnosis not present

## 2018-06-12 DIAGNOSIS — I10 Essential (primary) hypertension: Secondary | ICD-10-CM | POA: Diagnosis not present

## 2018-06-12 DIAGNOSIS — E876 Hypokalemia: Secondary | ICD-10-CM | POA: Diagnosis not present

## 2018-06-12 DIAGNOSIS — N390 Urinary tract infection, site not specified: Secondary | ICD-10-CM | POA: Diagnosis not present

## 2018-06-12 DIAGNOSIS — E139 Other specified diabetes mellitus without complications: Secondary | ICD-10-CM | POA: Diagnosis not present

## 2018-06-13 DIAGNOSIS — N186 End stage renal disease: Secondary | ICD-10-CM | POA: Diagnosis not present

## 2018-06-13 DIAGNOSIS — N390 Urinary tract infection, site not specified: Secondary | ICD-10-CM | POA: Diagnosis not present

## 2018-06-13 DIAGNOSIS — E876 Hypokalemia: Secondary | ICD-10-CM | POA: Diagnosis not present

## 2018-06-13 DIAGNOSIS — I10 Essential (primary) hypertension: Secondary | ICD-10-CM | POA: Diagnosis not present

## 2018-06-13 DIAGNOSIS — E139 Other specified diabetes mellitus without complications: Secondary | ICD-10-CM | POA: Diagnosis not present

## 2018-06-17 DIAGNOSIS — E119 Type 2 diabetes mellitus without complications: Secondary | ICD-10-CM | POA: Diagnosis not present

## 2018-06-22 DIAGNOSIS — N390 Urinary tract infection, site not specified: Secondary | ICD-10-CM | POA: Diagnosis not present

## 2018-06-23 DIAGNOSIS — Z6824 Body mass index (BMI) 24.0-24.9, adult: Secondary | ICD-10-CM | POA: Diagnosis not present

## 2018-06-23 DIAGNOSIS — Z7189 Other specified counseling: Secondary | ICD-10-CM | POA: Diagnosis not present

## 2018-06-23 DIAGNOSIS — E119 Type 2 diabetes mellitus without complications: Secondary | ICD-10-CM | POA: Diagnosis not present

## 2018-06-23 DIAGNOSIS — Z940 Kidney transplant status: Secondary | ICD-10-CM | POA: Diagnosis not present

## 2018-06-23 DIAGNOSIS — I10 Essential (primary) hypertension: Secondary | ICD-10-CM | POA: Diagnosis not present

## 2018-06-23 DIAGNOSIS — R635 Abnormal weight gain: Secondary | ICD-10-CM | POA: Diagnosis not present

## 2018-07-21 DIAGNOSIS — I10 Essential (primary) hypertension: Secondary | ICD-10-CM | POA: Diagnosis not present

## 2018-07-21 DIAGNOSIS — E119 Type 2 diabetes mellitus without complications: Secondary | ICD-10-CM | POA: Diagnosis not present

## 2018-07-21 DIAGNOSIS — Z6824 Body mass index (BMI) 24.0-24.9, adult: Secondary | ICD-10-CM | POA: Diagnosis not present

## 2018-07-21 DIAGNOSIS — Z940 Kidney transplant status: Secondary | ICD-10-CM | POA: Diagnosis not present

## 2018-07-21 DIAGNOSIS — R635 Abnormal weight gain: Secondary | ICD-10-CM | POA: Diagnosis not present

## 2018-07-21 DIAGNOSIS — Z7189 Other specified counseling: Secondary | ICD-10-CM | POA: Diagnosis not present

## 2018-08-05 DIAGNOSIS — Z7189 Other specified counseling: Secondary | ICD-10-CM | POA: Diagnosis not present

## 2018-08-05 DIAGNOSIS — Z6824 Body mass index (BMI) 24.0-24.9, adult: Secondary | ICD-10-CM | POA: Diagnosis not present

## 2018-08-05 DIAGNOSIS — E119 Type 2 diabetes mellitus without complications: Secondary | ICD-10-CM | POA: Diagnosis not present

## 2018-08-05 DIAGNOSIS — Z940 Kidney transplant status: Secondary | ICD-10-CM | POA: Diagnosis not present

## 2018-08-05 DIAGNOSIS — R635 Abnormal weight gain: Secondary | ICD-10-CM | POA: Diagnosis not present

## 2018-08-05 DIAGNOSIS — I10 Essential (primary) hypertension: Secondary | ICD-10-CM | POA: Diagnosis not present

## 2018-08-18 DIAGNOSIS — M8588 Other specified disorders of bone density and structure, other site: Secondary | ICD-10-CM | POA: Diagnosis not present

## 2018-08-18 DIAGNOSIS — Z1231 Encounter for screening mammogram for malignant neoplasm of breast: Secondary | ICD-10-CM | POA: Diagnosis not present

## 2018-08-18 DIAGNOSIS — N958 Other specified menopausal and perimenopausal disorders: Secondary | ICD-10-CM | POA: Diagnosis not present

## 2018-08-27 DIAGNOSIS — D1801 Hemangioma of skin and subcutaneous tissue: Secondary | ICD-10-CM | POA: Diagnosis not present

## 2018-08-27 DIAGNOSIS — Z940 Kidney transplant status: Secondary | ICD-10-CM | POA: Diagnosis not present

## 2018-08-27 DIAGNOSIS — D229 Melanocytic nevi, unspecified: Secondary | ICD-10-CM | POA: Diagnosis not present

## 2018-08-27 DIAGNOSIS — L815 Leukoderma, not elsewhere classified: Secondary | ICD-10-CM | POA: Diagnosis not present

## 2018-08-27 DIAGNOSIS — L708 Other acne: Secondary | ICD-10-CM | POA: Diagnosis not present

## 2018-08-31 DIAGNOSIS — Z940 Kidney transplant status: Secondary | ICD-10-CM | POA: Diagnosis not present

## 2018-08-31 DIAGNOSIS — E139 Other specified diabetes mellitus without complications: Secondary | ICD-10-CM | POA: Diagnosis not present

## 2018-09-01 DIAGNOSIS — Z0000 Encounter for general adult medical examination without abnormal findings: Secondary | ICD-10-CM | POA: Diagnosis not present

## 2018-09-01 DIAGNOSIS — N185 Chronic kidney disease, stage 5: Secondary | ICD-10-CM | POA: Diagnosis not present

## 2018-09-01 DIAGNOSIS — Z79899 Other long term (current) drug therapy: Secondary | ICD-10-CM | POA: Diagnosis not present

## 2018-09-01 DIAGNOSIS — Z940 Kidney transplant status: Secondary | ICD-10-CM | POA: Diagnosis not present

## 2018-09-01 DIAGNOSIS — R442 Other hallucinations: Secondary | ICD-10-CM | POA: Diagnosis not present

## 2018-09-01 DIAGNOSIS — F418 Other specified anxiety disorders: Secondary | ICD-10-CM | POA: Diagnosis not present

## 2018-09-01 DIAGNOSIS — N2581 Secondary hyperparathyroidism of renal origin: Secondary | ICD-10-CM | POA: Diagnosis not present

## 2018-09-01 DIAGNOSIS — E1122 Type 2 diabetes mellitus with diabetic chronic kidney disease: Secondary | ICD-10-CM | POA: Diagnosis not present

## 2018-09-01 DIAGNOSIS — I10 Essential (primary) hypertension: Secondary | ICD-10-CM | POA: Diagnosis not present

## 2018-09-01 DIAGNOSIS — E785 Hyperlipidemia, unspecified: Secondary | ICD-10-CM | POA: Diagnosis not present

## 2018-09-01 DIAGNOSIS — Z794 Long term (current) use of insulin: Secondary | ICD-10-CM | POA: Diagnosis not present

## 2018-09-02 DIAGNOSIS — E119 Type 2 diabetes mellitus without complications: Secondary | ICD-10-CM | POA: Diagnosis not present

## 2018-09-02 DIAGNOSIS — Z1159 Encounter for screening for other viral diseases: Secondary | ICD-10-CM | POA: Diagnosis not present

## 2018-09-02 DIAGNOSIS — E785 Hyperlipidemia, unspecified: Secondary | ICD-10-CM | POA: Diagnosis not present

## 2018-09-02 DIAGNOSIS — E876 Hypokalemia: Secondary | ICD-10-CM | POA: Diagnosis not present

## 2018-09-02 DIAGNOSIS — Z940 Kidney transplant status: Secondary | ICD-10-CM | POA: Diagnosis not present

## 2018-09-02 DIAGNOSIS — I10 Essential (primary) hypertension: Secondary | ICD-10-CM | POA: Diagnosis not present

## 2018-09-16 DIAGNOSIS — Z940 Kidney transplant status: Secondary | ICD-10-CM | POA: Diagnosis not present

## 2018-09-16 DIAGNOSIS — Z23 Encounter for immunization: Secondary | ICD-10-CM | POA: Diagnosis not present

## 2018-09-16 DIAGNOSIS — I10 Essential (primary) hypertension: Secondary | ICD-10-CM | POA: Diagnosis not present

## 2018-09-16 DIAGNOSIS — E119 Type 2 diabetes mellitus without complications: Secondary | ICD-10-CM | POA: Diagnosis not present

## 2018-09-16 DIAGNOSIS — Z7189 Other specified counseling: Secondary | ICD-10-CM | POA: Diagnosis not present

## 2018-09-16 DIAGNOSIS — Z6824 Body mass index (BMI) 24.0-24.9, adult: Secondary | ICD-10-CM | POA: Diagnosis not present

## 2018-10-02 DIAGNOSIS — M2241 Chondromalacia patellae, right knee: Secondary | ICD-10-CM | POA: Diagnosis not present

## 2018-10-02 DIAGNOSIS — M7140 Calcium deposit in bursa, unspecified site: Secondary | ICD-10-CM | POA: Diagnosis not present

## 2018-10-02 DIAGNOSIS — M25561 Pain in right knee: Secondary | ICD-10-CM | POA: Diagnosis not present

## 2018-10-02 DIAGNOSIS — M25559 Pain in unspecified hip: Secondary | ICD-10-CM | POA: Diagnosis not present

## 2018-10-05 ENCOUNTER — Ambulatory Visit
Admission: RE | Admit: 2018-10-05 | Discharge: 2018-10-05 | Disposition: A | Discharge status: Home / Self Care | Payer: Medicare Other | Source: Ambulatory Visit | Attending: Family Medicine | Admitting: Family Medicine

## 2018-10-05 ENCOUNTER — Other Ambulatory Visit: Payer: Self-pay | Admitting: Family Medicine

## 2018-10-05 DIAGNOSIS — M160 Bilateral primary osteoarthritis of hip: Secondary | ICD-10-CM | POA: Diagnosis not present

## 2018-10-05 DIAGNOSIS — R52 Pain, unspecified: Secondary | ICD-10-CM

## 2018-10-05 DIAGNOSIS — M25561 Pain in right knee: Secondary | ICD-10-CM | POA: Diagnosis not present

## 2018-11-17 DIAGNOSIS — N9089 Other specified noninflammatory disorders of vulva and perineum: Secondary | ICD-10-CM | POA: Diagnosis not present

## 2018-11-17 DIAGNOSIS — N762 Acute vulvitis: Secondary | ICD-10-CM | POA: Diagnosis not present

## 2018-11-23 DIAGNOSIS — A609 Anogenital herpesviral infection, unspecified: Secondary | ICD-10-CM | POA: Diagnosis not present

## 2018-11-23 DIAGNOSIS — A6000 Herpesviral infection of urogenital system, unspecified: Secondary | ICD-10-CM | POA: Insufficient documentation

## 2018-11-23 DIAGNOSIS — N9089 Other specified noninflammatory disorders of vulva and perineum: Secondary | ICD-10-CM | POA: Diagnosis not present

## 2018-12-01 DIAGNOSIS — M25552 Pain in left hip: Secondary | ICD-10-CM | POA: Diagnosis not present

## 2018-12-01 DIAGNOSIS — M25551 Pain in right hip: Secondary | ICD-10-CM | POA: Diagnosis not present

## 2018-12-01 DIAGNOSIS — M7061 Trochanteric bursitis, right hip: Secondary | ICD-10-CM | POA: Diagnosis not present

## 2018-12-01 DIAGNOSIS — M7062 Trochanteric bursitis, left hip: Secondary | ICD-10-CM | POA: Diagnosis not present

## 2018-12-14 DIAGNOSIS — M7061 Trochanteric bursitis, right hip: Secondary | ICD-10-CM | POA: Diagnosis not present

## 2018-12-17 DIAGNOSIS — Z6824 Body mass index (BMI) 24.0-24.9, adult: Secondary | ICD-10-CM | POA: Diagnosis not present

## 2018-12-17 DIAGNOSIS — Z940 Kidney transplant status: Secondary | ICD-10-CM | POA: Diagnosis not present

## 2018-12-17 DIAGNOSIS — E785 Hyperlipidemia, unspecified: Secondary | ICD-10-CM | POA: Diagnosis not present

## 2018-12-17 DIAGNOSIS — M25552 Pain in left hip: Secondary | ICD-10-CM | POA: Diagnosis not present

## 2018-12-17 DIAGNOSIS — I10 Essential (primary) hypertension: Secondary | ICD-10-CM | POA: Diagnosis not present

## 2018-12-17 DIAGNOSIS — E119 Type 2 diabetes mellitus without complications: Secondary | ICD-10-CM | POA: Diagnosis not present

## 2018-12-17 DIAGNOSIS — Z7189 Other specified counseling: Secondary | ICD-10-CM | POA: Diagnosis not present

## 2018-12-17 DIAGNOSIS — M25551 Pain in right hip: Secondary | ICD-10-CM | POA: Diagnosis not present

## 2018-12-21 DIAGNOSIS — M1611 Unilateral primary osteoarthritis, right hip: Secondary | ICD-10-CM | POA: Diagnosis not present

## 2018-12-21 DIAGNOSIS — M7061 Trochanteric bursitis, right hip: Secondary | ICD-10-CM | POA: Diagnosis not present

## 2018-12-24 DIAGNOSIS — Z940 Kidney transplant status: Secondary | ICD-10-CM | POA: Diagnosis not present

## 2018-12-24 DIAGNOSIS — E119 Type 2 diabetes mellitus without complications: Secondary | ICD-10-CM | POA: Diagnosis not present

## 2018-12-24 DIAGNOSIS — I10 Essential (primary) hypertension: Secondary | ICD-10-CM | POA: Diagnosis not present

## 2018-12-24 DIAGNOSIS — E876 Hypokalemia: Secondary | ICD-10-CM | POA: Diagnosis not present

## 2018-12-24 DIAGNOSIS — E785 Hyperlipidemia, unspecified: Secondary | ICD-10-CM | POA: Diagnosis not present

## 2019-03-02 DIAGNOSIS — E139 Other specified diabetes mellitus without complications: Secondary | ICD-10-CM | POA: Diagnosis not present

## 2019-03-02 DIAGNOSIS — N2889 Other specified disorders of kidney and ureter: Secondary | ICD-10-CM | POA: Diagnosis not present

## 2019-03-02 DIAGNOSIS — I151 Hypertension secondary to other renal disorders: Secondary | ICD-10-CM | POA: Diagnosis not present

## 2019-03-02 DIAGNOSIS — Z794 Long term (current) use of insulin: Secondary | ICD-10-CM | POA: Diagnosis not present

## 2019-03-02 DIAGNOSIS — E891 Postprocedural hypoinsulinemia: Secondary | ICD-10-CM | POA: Diagnosis not present

## 2019-03-02 DIAGNOSIS — Z7952 Long term (current) use of systemic steroids: Secondary | ICD-10-CM | POA: Diagnosis not present

## 2019-03-02 DIAGNOSIS — Z4822 Encounter for aftercare following kidney transplant: Secondary | ICD-10-CM | POA: Diagnosis not present

## 2019-03-02 DIAGNOSIS — N390 Urinary tract infection, site not specified: Secondary | ICD-10-CM | POA: Diagnosis not present

## 2019-03-02 DIAGNOSIS — Z79899 Other long term (current) drug therapy: Secondary | ICD-10-CM | POA: Diagnosis not present

## 2019-03-02 DIAGNOSIS — Z940 Kidney transplant status: Secondary | ICD-10-CM | POA: Diagnosis not present

## 2019-03-02 DIAGNOSIS — I10 Essential (primary) hypertension: Secondary | ICD-10-CM | POA: Diagnosis not present

## 2019-03-17 DIAGNOSIS — E119 Type 2 diabetes mellitus without complications: Secondary | ICD-10-CM | POA: Diagnosis not present

## 2019-03-17 DIAGNOSIS — Z940 Kidney transplant status: Secondary | ICD-10-CM | POA: Diagnosis not present

## 2019-03-17 DIAGNOSIS — Z79899 Other long term (current) drug therapy: Secondary | ICD-10-CM | POA: Diagnosis not present

## 2019-03-23 DIAGNOSIS — Z940 Kidney transplant status: Secondary | ICD-10-CM | POA: Diagnosis not present

## 2019-03-23 DIAGNOSIS — E119 Type 2 diabetes mellitus without complications: Secondary | ICD-10-CM | POA: Diagnosis not present

## 2019-03-23 DIAGNOSIS — I10 Essential (primary) hypertension: Secondary | ICD-10-CM | POA: Diagnosis not present

## 2019-03-23 DIAGNOSIS — Z6824 Body mass index (BMI) 24.0-24.9, adult: Secondary | ICD-10-CM | POA: Diagnosis not present

## 2019-03-23 DIAGNOSIS — Z7189 Other specified counseling: Secondary | ICD-10-CM | POA: Diagnosis not present

## 2019-04-12 DIAGNOSIS — N390 Urinary tract infection, site not specified: Secondary | ICD-10-CM | POA: Diagnosis not present

## 2019-04-12 DIAGNOSIS — N952 Postmenopausal atrophic vaginitis: Secondary | ICD-10-CM | POA: Diagnosis not present

## 2019-04-12 DIAGNOSIS — Z940 Kidney transplant status: Secondary | ICD-10-CM | POA: Diagnosis not present

## 2019-04-12 DIAGNOSIS — Z87440 Personal history of urinary (tract) infections: Secondary | ICD-10-CM | POA: Diagnosis not present

## 2019-04-12 DIAGNOSIS — D849 Immunodeficiency, unspecified: Secondary | ICD-10-CM | POA: Diagnosis not present

## 2019-05-05 DIAGNOSIS — Z940 Kidney transplant status: Secondary | ICD-10-CM | POA: Diagnosis not present

## 2019-05-05 DIAGNOSIS — E119 Type 2 diabetes mellitus without complications: Secondary | ICD-10-CM | POA: Diagnosis not present

## 2019-05-05 DIAGNOSIS — E876 Hypokalemia: Secondary | ICD-10-CM | POA: Diagnosis not present

## 2019-05-14 DIAGNOSIS — Z940 Kidney transplant status: Secondary | ICD-10-CM | POA: Diagnosis not present

## 2019-05-14 DIAGNOSIS — I10 Essential (primary) hypertension: Secondary | ICD-10-CM | POA: Diagnosis not present

## 2019-05-14 DIAGNOSIS — E785 Hyperlipidemia, unspecified: Secondary | ICD-10-CM | POA: Diagnosis not present

## 2019-05-14 DIAGNOSIS — E119 Type 2 diabetes mellitus without complications: Secondary | ICD-10-CM | POA: Diagnosis not present

## 2019-05-14 DIAGNOSIS — E876 Hypokalemia: Secondary | ICD-10-CM | POA: Diagnosis not present

## 2019-07-04 ENCOUNTER — Other Ambulatory Visit: Payer: Self-pay

## 2019-07-04 ENCOUNTER — Encounter (HOSPITAL_COMMUNITY): Payer: Self-pay | Admitting: Emergency Medicine

## 2019-07-04 ENCOUNTER — Emergency Department (HOSPITAL_COMMUNITY)
Admission: EM | Admit: 2019-07-04 | Discharge: 2019-07-04 | Disposition: A | Discharge status: Home / Self Care | Payer: Medicare Other | Attending: Emergency Medicine | Admitting: Emergency Medicine

## 2019-07-04 DIAGNOSIS — Z940 Kidney transplant status: Secondary | ICD-10-CM | POA: Diagnosis not present

## 2019-07-04 DIAGNOSIS — E119 Type 2 diabetes mellitus without complications: Secondary | ICD-10-CM | POA: Diagnosis not present

## 2019-07-04 DIAGNOSIS — I10 Essential (primary) hypertension: Secondary | ICD-10-CM | POA: Diagnosis not present

## 2019-07-04 DIAGNOSIS — Z79899 Other long term (current) drug therapy: Secondary | ICD-10-CM | POA: Insufficient documentation

## 2019-07-04 DIAGNOSIS — H1131 Conjunctival hemorrhage, right eye: Secondary | ICD-10-CM | POA: Diagnosis not present

## 2019-07-04 DIAGNOSIS — Z794 Long term (current) use of insulin: Secondary | ICD-10-CM | POA: Insufficient documentation

## 2019-07-04 DIAGNOSIS — H5711 Ocular pain, right eye: Secondary | ICD-10-CM | POA: Diagnosis present

## 2019-07-04 LAB — COMPREHENSIVE METABOLIC PANEL
ALT: 14 U/L (ref 0–44)
AST: 21 U/L (ref 15–41)
Albumin: 3.4 g/dL — ABNORMAL LOW (ref 3.5–5.0)
Alkaline Phosphatase: 42 U/L (ref 38–126)
Anion gap: 9 (ref 5–15)
BUN: 23 mg/dL (ref 8–23)
CO2: 25 mmol/L (ref 22–32)
Calcium: 9.3 mg/dL (ref 8.9–10.3)
Chloride: 105 mmol/L (ref 98–111)
Creatinine, Ser: 1.17 mg/dL — ABNORMAL HIGH (ref 0.44–1.00)
GFR calc Af Amer: 53 mL/min — ABNORMAL LOW (ref >60)
GFR calc non Af Amer: 46 mL/min — ABNORMAL LOW (ref >60)
Glucose, Bld: 168 mg/dL — ABNORMAL HIGH (ref 70–99)
Potassium: 4 mmol/L (ref 3.5–5.1)
Sodium: 139 mmol/L (ref 135–145)
Total Bilirubin: 0.5 mg/dL (ref 0.3–1.2)
Total Protein: 6 g/dL — ABNORMAL LOW (ref 6.5–8.1)

## 2019-07-04 LAB — CBC WITH DIFFERENTIAL/PLATELET
Abs Immature Granulocytes: 0.02 10*3/uL (ref 0.00–0.07)
Basophils Absolute: 0 10*3/uL (ref 0.0–0.1)
Basophils Relative: 1 %
Eosinophils Absolute: 0 10*3/uL (ref 0.0–0.5)
Eosinophils Relative: 1 %
HCT: 39.5 % (ref 36.0–46.0)
Hemoglobin: 12.6 g/dL (ref 12.0–15.0)
Immature Granulocytes: 0 %
Lymphocytes Relative: 26 %
Lymphs Abs: 1.5 10*3/uL (ref 0.7–4.0)
MCH: 31.5 pg (ref 26.0–34.0)
MCHC: 31.9 g/dL (ref 30.0–36.0)
MCV: 98.8 fL (ref 80.0–100.0)
Monocytes Absolute: 0.5 10*3/uL (ref 0.1–1.0)
Monocytes Relative: 8 %
Neutro Abs: 3.9 10*3/uL (ref 1.7–7.7)
Neutrophils Relative %: 64 %
Platelets: 256 10*3/uL (ref 150–400)
RBC: 4 MIL/uL (ref 3.87–5.11)
RDW: 13.1 % (ref 11.5–15.5)
WBC: 6 10*3/uL (ref 4.0–10.5)
nRBC: 0 % (ref 0.0–0.2)

## 2019-07-04 LAB — PROTIME-INR
INR: 0.9 (ref 0.8–1.2)
Prothrombin Time: 12 seconds (ref 11.4–15.2)

## 2019-07-04 NOTE — Discharge Instructions (Addendum)
Return if you are having any problems. 

## 2019-07-04 NOTE — ED Provider Notes (Signed)
[facility] EMERGENCY DEPARTMENT Provider Note   CSN: [id number] Arrival date & time: 07/04/19  1904   History Chief Complaint  Patient presents with  . Eye Problem    [name] is a 74 y.o. female.  The history is provided by the patient.  Eye Problem She has history of hypertension, diabetes, renal transplant and comes in because of redness and swelling around her right eye.  It is mildly sore but not itchy.  She denies any trauma.  She denies any change in her vision.  She has had subconjunctival hemorrhages in the past and this is similar except there is some swelling.  She is not on any anticoagulants.  Past Medical History:  Diagnosis Date  . Hx of tubal ligation t  . Hypertension   . Renal insufficiency     Patient Active Problem List   Diagnosis Date Noted  . Type 2 diabetes mellitus with complication ([facility]) 56/31/4970    Past Surgical History:  Procedure Laterality Date  . KIDNEY TRANSPLANT    . PERITONEAL CATHETER INSERTION    . PERITONEAL CATHETER REMOVAL       OB History   No obstetric history on file.     No family history on file.  Social History   Tobacco Use  . Smoking status: Never Smoker  . Smokeless tobacco: Never Used  Substance Use Topics  . Alcohol use: No  . Drug use: No    Home Medications Prior to Admission medications   Medication Sig Start Date End Date Taking? Authorizing Provider  atorvastatin (LIPITOR) 20 MG tablet Take 20 mg by mouth daily.    [provider]  chlorthalidone (HYGROTON) 25 MG tablet Take 25 mg by mouth daily.    [provider]  cloNIDine (CATAPRES) 0.1 MG tablet Take 0.1 mg by mouth 3 (three) times daily.      [provider]  estradiol (VIVELLE-DOT) 0.025 MG/24HR Place 1 patch onto the skin 2 (two) times a week.      [provider]  fluticasone (FLONASE) 50 MCG/ACT nasal spray Place 2 sprays into the nose daily. 01/13/11 01/13/12  Domenic Moras,  PA-C  Insulin Degludec (TRESIBA FLEXTOUCH [place]) Inject 24 Units into the skin daily.    [provider]  magnesium oxide (MAG-OX) 400 MG tablet Take 400 mg by mouth daily.    [provider]  mycophenolate (MYFORTIC) 180 MG EC tablet Take 180 mg by mouth 2 (two) times daily.    [provider]  phenazopyridine (PYRIDIUM) 200 MG tablet Take 1 tablet (200 mg total) by mouth 3 (three) times daily. 06/12/14   Delos Haring, PA-C  potassium chloride SA (K-DUR,KLOR-CON) 20 MEQ tablet Take 20 mEq by mouth 2 (two) times daily.    [provider]  predniSONE (DELTASONE) 5 MG tablet Take 5 mg by mouth daily with breakfast.    [provider]  PRESCRIPTION MEDICATION Take 0.5 tablets by mouth daily. Nor syndrome hormonal tablet     [provider]  sevelamer (RENVELA) 800 MG tablet Take 1,600 mg by mouth 3 (three) times daily with meals.      [provider]  sulfamethoxazole-trimethoprim (BACTRIM,SEPTRA) 400-80 MG tablet Take 1 tablet by mouth 2 (two) times daily.    [provider]  tacrolimus (PROGRAF) 1 MG capsule Take 1 mg by mouth 2 (two) times daily.    [provider]    Allergies    Advil [ibuprofen] and Aspirin  Review of Systems   Review of Systems  All other systems reviewed and are negative.   Physical Exam Updated Vital Signs BP (!) 148/75 (BP Location: Right Arm)   Pulse 61   Temp 98.3 F (36.8 C) (Oral)   Resp 16   Ht 4' 11.5" (1.511 m)   Wt 54 kg   SpO2 97%   BMI 23.63 kg/m   Physical Exam Vitals and nursing note reviewed.   74 year old female, resting comfortably and in no acute distress. Vital signs are significant for mildly elevated blood pressure. Oxygen saturation is 97%, which is normal. Head is normocephalic and atraumatic. PERRLA, EOMI. Oropharynx is clear.  Subconjunctival hemorrhage is present on the right involving the entire subconjunctiva with some subconjunctival swelling.   Anterior chamber is clear. Neck is nontender and supple without adenopathy or JVD. Back is nontender and there is no CVA tenderness. Lungs are clear without rales, wheezes, or rhonchi. Chest is nontender. Heart has regular rate and rhythm without murmur. Abdomen is soft, flat, nontender without masses or hepatosplenomegaly and peristalsis is normoactive. Extremities have no cyanosis or edema, full range of motion is present. Skin is warm and dry without rash. Neurologic: Mental status is normal, cranial nerves are intact, there are no motor or sensory deficits.  ED Results / Procedures / Treatments   Labs (all labs ordered are listed, but only abnormal results are displayed) Labs Reviewed  COMPREHENSIVE METABOLIC PANEL - Abnormal; Notable for the following components:      Result Value   Glucose, Bld 168 (*)    Creatinine, Ser 1.17 (*)    Total Protein 6.0 (*)    Albumin 3.4 (*)    GFR calc non Af Amer 46 (*)    GFR calc Af Amer 53 (*)    All other components within normal limits  CBC WITH DIFFERENTIAL/PLATELET  PROTIME-INR    Procedures Procedures (including critical care time)  Medications Ordered in ED Medications - No data to display  ED Course  I have reviewed the triage vital signs and the nursing notes.  Pertinent lab results that were available during my care of the patient were reviewed by me and considered in my medical decision making (see chart for details).  MDM Rules/Calculators/A&P Subconjunctival hemorrhage of the right eye.  Labs are unremarkable including normal platelet count.  She is given reassurance and discharged with instructions to follow-up with PCP.  Old records are reviewed, and she has no relevant past visits.  Final Clinical Impression(s) / ED Diagnoses Final diagnoses:  Subconjunctival hemorrhage of right eye    Rx / DC Orders ED Discharge Orders    None       Delora Fuel, MD 20/10/07 2318

## 2019-07-04 NOTE — ED Triage Notes (Signed)
Patient reports right eye bloodshot onset this morning , denies blurred vision or vision loss, no eye injury , hypertensive at triage .

## 2019-08-30 DIAGNOSIS — Z23 Encounter for immunization: Secondary | ICD-10-CM | POA: Diagnosis not present

## 2019-09-02 DIAGNOSIS — E119 Type 2 diabetes mellitus without complications: Secondary | ICD-10-CM | POA: Diagnosis not present

## 2019-09-02 DIAGNOSIS — Z940 Kidney transplant status: Secondary | ICD-10-CM | POA: Diagnosis not present

## 2019-09-02 DIAGNOSIS — E876 Hypokalemia: Secondary | ICD-10-CM | POA: Diagnosis not present

## 2019-09-07 DIAGNOSIS — E119 Type 2 diabetes mellitus without complications: Secondary | ICD-10-CM | POA: Diagnosis not present

## 2019-09-07 DIAGNOSIS — I10 Essential (primary) hypertension: Secondary | ICD-10-CM | POA: Diagnosis not present

## 2019-09-07 DIAGNOSIS — E876 Hypokalemia: Secondary | ICD-10-CM | POA: Diagnosis not present

## 2019-09-07 DIAGNOSIS — Z940 Kidney transplant status: Secondary | ICD-10-CM | POA: Diagnosis not present

## 2019-09-07 DIAGNOSIS — E785 Hyperlipidemia, unspecified: Secondary | ICD-10-CM | POA: Diagnosis not present

## 2019-09-17 DIAGNOSIS — I10 Essential (primary) hypertension: Secondary | ICD-10-CM | POA: Diagnosis not present

## 2019-09-17 DIAGNOSIS — Z79899 Other long term (current) drug therapy: Secondary | ICD-10-CM | POA: Diagnosis not present

## 2019-09-17 DIAGNOSIS — N2581 Secondary hyperparathyroidism of renal origin: Secondary | ICD-10-CM | POA: Diagnosis not present

## 2019-09-17 DIAGNOSIS — E785 Hyperlipidemia, unspecified: Secondary | ICD-10-CM | POA: Diagnosis not present

## 2019-09-17 DIAGNOSIS — Z0000 Encounter for general adult medical examination without abnormal findings: Secondary | ICD-10-CM | POA: Diagnosis not present

## 2019-09-17 DIAGNOSIS — Z23 Encounter for immunization: Secondary | ICD-10-CM | POA: Diagnosis not present

## 2019-09-17 DIAGNOSIS — E1122 Type 2 diabetes mellitus with diabetic chronic kidney disease: Secondary | ICD-10-CM | POA: Diagnosis not present

## 2019-09-17 DIAGNOSIS — Z940 Kidney transplant status: Secondary | ICD-10-CM | POA: Diagnosis not present

## 2019-09-23 DIAGNOSIS — I10 Essential (primary) hypertension: Secondary | ICD-10-CM | POA: Diagnosis not present

## 2019-09-23 DIAGNOSIS — Z6824 Body mass index (BMI) 24.0-24.9, adult: Secondary | ICD-10-CM | POA: Diagnosis not present

## 2019-09-23 DIAGNOSIS — E119 Type 2 diabetes mellitus without complications: Secondary | ICD-10-CM | POA: Diagnosis not present

## 2019-09-23 DIAGNOSIS — Z940 Kidney transplant status: Secondary | ICD-10-CM | POA: Diagnosis not present

## 2019-10-18 DIAGNOSIS — D849 Immunodeficiency, unspecified: Secondary | ICD-10-CM | POA: Diagnosis not present

## 2019-10-18 DIAGNOSIS — N390 Urinary tract infection, site not specified: Secondary | ICD-10-CM | POA: Diagnosis not present

## 2019-10-18 DIAGNOSIS — N952 Postmenopausal atrophic vaginitis: Secondary | ICD-10-CM | POA: Diagnosis not present

## 2019-10-18 DIAGNOSIS — Z940 Kidney transplant status: Secondary | ICD-10-CM | POA: Diagnosis not present

## 2019-12-23 DIAGNOSIS — I10 Essential (primary) hypertension: Secondary | ICD-10-CM | POA: Diagnosis not present

## 2019-12-23 DIAGNOSIS — E119 Type 2 diabetes mellitus without complications: Secondary | ICD-10-CM | POA: Diagnosis not present

## 2019-12-23 DIAGNOSIS — Z940 Kidney transplant status: Secondary | ICD-10-CM | POA: Diagnosis not present

## 2019-12-23 DIAGNOSIS — Z6824 Body mass index (BMI) 24.0-24.9, adult: Secondary | ICD-10-CM | POA: Diagnosis not present

## 2019-12-23 DIAGNOSIS — E139 Other specified diabetes mellitus without complications: Secondary | ICD-10-CM | POA: Diagnosis not present

## 2019-12-23 DIAGNOSIS — N390 Urinary tract infection, site not specified: Secondary | ICD-10-CM | POA: Diagnosis not present

## 2019-12-27 DIAGNOSIS — E876 Hypokalemia: Secondary | ICD-10-CM | POA: Diagnosis not present

## 2019-12-27 DIAGNOSIS — E785 Hyperlipidemia, unspecified: Secondary | ICD-10-CM | POA: Diagnosis not present

## 2019-12-27 DIAGNOSIS — E119 Type 2 diabetes mellitus without complications: Secondary | ICD-10-CM | POA: Diagnosis not present

## 2019-12-27 DIAGNOSIS — I10 Essential (primary) hypertension: Secondary | ICD-10-CM | POA: Diagnosis not present

## 2019-12-27 DIAGNOSIS — Z940 Kidney transplant status: Secondary | ICD-10-CM | POA: Diagnosis not present

## 2020-03-07 DIAGNOSIS — Z79899 Other long term (current) drug therapy: Secondary | ICD-10-CM | POA: Diagnosis not present

## 2020-03-07 DIAGNOSIS — Z4822 Encounter for aftercare following kidney transplant: Secondary | ICD-10-CM | POA: Diagnosis not present

## 2020-03-07 DIAGNOSIS — N390 Urinary tract infection, site not specified: Secondary | ICD-10-CM | POA: Diagnosis not present

## 2020-03-07 DIAGNOSIS — E138 Other specified diabetes mellitus with unspecified complications: Secondary | ICD-10-CM | POA: Diagnosis not present

## 2020-03-07 DIAGNOSIS — E785 Hyperlipidemia, unspecified: Secondary | ICD-10-CM | POA: Diagnosis not present

## 2020-03-07 DIAGNOSIS — E119 Type 2 diabetes mellitus without complications: Secondary | ICD-10-CM | POA: Diagnosis not present

## 2020-03-07 DIAGNOSIS — D8989 Other specified disorders involving the immune mechanism, not elsewhere classified: Secondary | ICD-10-CM | POA: Diagnosis not present

## 2020-03-07 DIAGNOSIS — I10 Essential (primary) hypertension: Secondary | ICD-10-CM | POA: Diagnosis not present

## 2020-03-07 DIAGNOSIS — Z940 Kidney transplant status: Secondary | ICD-10-CM | POA: Diagnosis not present

## 2020-03-07 DIAGNOSIS — K219 Gastro-esophageal reflux disease without esophagitis: Secondary | ICD-10-CM | POA: Diagnosis not present

## 2020-03-07 DIAGNOSIS — D849 Immunodeficiency, unspecified: Secondary | ICD-10-CM | POA: Diagnosis not present

## 2020-04-12 DIAGNOSIS — Z940 Kidney transplant status: Secondary | ICD-10-CM | POA: Diagnosis not present

## 2020-04-12 DIAGNOSIS — E139 Other specified diabetes mellitus without complications: Secondary | ICD-10-CM | POA: Diagnosis not present

## 2020-04-17 DIAGNOSIS — Z23 Encounter for immunization: Secondary | ICD-10-CM | POA: Diagnosis not present

## 2020-04-19 DIAGNOSIS — E876 Hypokalemia: Secondary | ICD-10-CM | POA: Diagnosis not present

## 2020-04-19 DIAGNOSIS — N390 Urinary tract infection, site not specified: Secondary | ICD-10-CM | POA: Diagnosis not present

## 2020-04-19 DIAGNOSIS — E119 Type 2 diabetes mellitus without complications: Secondary | ICD-10-CM | POA: Diagnosis not present

## 2020-04-19 DIAGNOSIS — I10 Essential (primary) hypertension: Secondary | ICD-10-CM | POA: Diagnosis not present

## 2020-04-19 DIAGNOSIS — Z940 Kidney transplant status: Secondary | ICD-10-CM | POA: Diagnosis not present

## 2020-06-02 DIAGNOSIS — H6121 Impacted cerumen, right ear: Secondary | ICD-10-CM | POA: Diagnosis not present

## 2020-06-02 DIAGNOSIS — M542 Cervicalgia: Secondary | ICD-10-CM | POA: Diagnosis not present

## 2020-06-13 DIAGNOSIS — H6121 Impacted cerumen, right ear: Secondary | ICD-10-CM | POA: Diagnosis not present

## 2020-06-22 DIAGNOSIS — Z940 Kidney transplant status: Secondary | ICD-10-CM | POA: Diagnosis not present

## 2020-06-22 DIAGNOSIS — E119 Type 2 diabetes mellitus without complications: Secondary | ICD-10-CM | POA: Diagnosis not present

## 2020-06-22 DIAGNOSIS — I10 Essential (primary) hypertension: Secondary | ICD-10-CM | POA: Diagnosis not present

## 2020-06-22 DIAGNOSIS — Z6824 Body mass index (BMI) 24.0-24.9, adult: Secondary | ICD-10-CM | POA: Diagnosis not present

## 2020-07-07 DIAGNOSIS — N390 Urinary tract infection, site not specified: Secondary | ICD-10-CM | POA: Diagnosis not present

## 2020-08-02 DIAGNOSIS — N390 Urinary tract infection, site not specified: Secondary | ICD-10-CM | POA: Diagnosis not present

## 2020-08-22 DIAGNOSIS — N390 Urinary tract infection, site not specified: Secondary | ICD-10-CM | POA: Diagnosis not present

## 2020-08-23 DIAGNOSIS — N390 Urinary tract infection, site not specified: Secondary | ICD-10-CM | POA: Diagnosis not present

## 2020-08-25 DIAGNOSIS — N390 Urinary tract infection, site not specified: Secondary | ICD-10-CM | POA: Diagnosis not present

## 2020-08-29 DIAGNOSIS — N390 Urinary tract infection, site not specified: Secondary | ICD-10-CM | POA: Diagnosis not present

## 2020-08-30 DIAGNOSIS — N390 Urinary tract infection, site not specified: Secondary | ICD-10-CM | POA: Diagnosis not present

## 2020-08-31 DIAGNOSIS — N390 Urinary tract infection, site not specified: Secondary | ICD-10-CM | POA: Diagnosis not present

## 2020-09-01 DIAGNOSIS — N390 Urinary tract infection, site not specified: Secondary | ICD-10-CM | POA: Diagnosis not present

## 2020-09-06 DIAGNOSIS — E119 Type 2 diabetes mellitus without complications: Secondary | ICD-10-CM | POA: Diagnosis not present

## 2020-09-06 DIAGNOSIS — Z940 Kidney transplant status: Secondary | ICD-10-CM | POA: Diagnosis not present

## 2020-09-11 DIAGNOSIS — I10 Essential (primary) hypertension: Secondary | ICD-10-CM | POA: Diagnosis not present

## 2020-09-11 DIAGNOSIS — E876 Hypokalemia: Secondary | ICD-10-CM | POA: Diagnosis not present

## 2020-09-11 DIAGNOSIS — Z940 Kidney transplant status: Secondary | ICD-10-CM | POA: Diagnosis not present

## 2020-09-11 DIAGNOSIS — E119 Type 2 diabetes mellitus without complications: Secondary | ICD-10-CM | POA: Diagnosis not present

## 2020-09-11 DIAGNOSIS — E785 Hyperlipidemia, unspecified: Secondary | ICD-10-CM | POA: Diagnosis not present

## 2020-09-24 DIAGNOSIS — Z20822 Contact with and (suspected) exposure to covid-19: Secondary | ICD-10-CM | POA: Diagnosis not present

## 2020-09-29 ENCOUNTER — Encounter: Payer: Self-pay | Admitting: Emergency Medicine

## 2020-09-29 ENCOUNTER — Ambulatory Visit
Admission: EM | Admit: 2020-09-29 | Discharge: 2020-09-29 | Disposition: A | Discharge status: Home / Self Care | Payer: Medicare Other | Attending: Emergency Medicine | Admitting: Emergency Medicine

## 2020-09-29 ENCOUNTER — Other Ambulatory Visit: Payer: Self-pay

## 2020-09-29 DIAGNOSIS — R300 Dysuria: Secondary | ICD-10-CM | POA: Diagnosis not present

## 2020-09-29 LAB — POCT URINALYSIS DIP (MANUAL ENTRY)
Bilirubin, UA: NEGATIVE
Glucose, UA: 500 mg/dL — AB
Ketones, POC UA: NEGATIVE mg/dL
Nitrite, UA: POSITIVE — AB
Protein Ur, POC: 100 mg/dL — AB
Spec Grav, UA: 1.015 (ref 1.010–1.025)
Urobilinogen, UA: 0.2 E.U./dL
pH, UA: 5.5 (ref 5.0–8.0)

## 2020-09-29 MED ORDER — CEFDINIR 300 MG PO CAPS: 300.0000 mg | ORAL_CAPSULE | Freq: Two times a day (BID) | ORAL | 0 refills | Status: DC

## 2020-09-29 NOTE — ED Provider Notes (Signed)
[facility]    CSN: [id number] Arrival date & time: 09/29/20  1234      History   Chief Complaint Chief Complaint  Patient presents with   Dysuria    HPI [name] is a 75 y.o. female history of DM type II, hypertension, renal insufficiency-status post renal transplant, presenting today for evaluation of dysuria.  Has history of recurrent UTIs.  Reports 2 days of dysuria, urinary frequency and lower abdominal pressure.  Denies any fevers nausea vomiting or back pain.  HPI  Past Medical History:  Diagnosis Date   Hx of tubal ligation t   Hypertension    Renal insufficiency     Patient Active Problem List   Diagnosis Date Noted   Type 2 diabetes mellitus with complication ([place]) 04/88/8916    Past Surgical History:  Procedure Laterality Date   KIDNEY TRANSPLANT     PERITONEAL CATHETER INSERTION     PERITONEAL CATHETER REMOVAL      OB History   No obstetric history on file.      Home Medications    Prior to Admission medications   Medication Sig Start Date End Date Taking? Authorizing Provider  cefdinir (OMNICEF) 300 MG capsule Take 1 capsule (300 mg total) by mouth 2 (two) times daily for 7 days. 09/29/20 10/06/20 Yes Zan Orlick C, PA-C  atorvastatin (LIPITOR) 20 MG tablet Take 20 mg by mouth daily.    [provider]  chlorthalidone (HYGROTON) 25 MG tablet Take 25 mg by mouth daily.    [provider]  cloNIDine (CATAPRES) 0.1 MG tablet Take 0.1 mg by mouth 3 (three) times daily.      [provider]  estradiol (VIVELLE-DOT) 0.025 MG/24HR Place 1 patch onto the skin 2 (two) times a week.      [provider]  fluticasone (FLONASE) 50 MCG/ACT nasal spray Place 2 sprays into the nose daily. 01/13/11 01/13/12  Domenic Moras, PA-C  Insulin Degludec (TRESIBA FLEXTOUCH [place]) Inject 24 Units into the skin daily.    [provider]  magnesium oxide (MAG-OX) 400 MG tablet Take 400 mg by mouth daily.     [provider]  mycophenolate (MYFORTIC) 180 MG EC tablet Take 180 mg by mouth 2 (two) times daily.    [provider]  potassium chloride SA (K-DUR,KLOR-CON) 20 MEQ tablet Take 20 mEq by mouth 2 (two) times daily.    [provider]  PRESCRIPTION MEDICATION Take 0.5 tablets by mouth daily. Nor syndrome hormonal tablet     [provider]  sevelamer (RENVELA) 800 MG tablet Take 1,600 mg by mouth 3 (three) times daily with meals.      [provider]  sulfamethoxazole-trimethoprim (BACTRIM,SEPTRA) 400-80 MG tablet Take 1 tablet by mouth 2 (two) times daily.    [provider]  tacrolimus (PROGRAF) 1 MG capsule Take 1 mg by mouth 2 (two) times daily.    [provider]    Family History Family History  Problem Relation Age of Onset   Healthy Mother    Cancer Father     Social History Social History   Tobacco Use   Smoking status: Never   Smokeless tobacco: Never  Substance Use Topics   Alcohol use: No   Drug use: No     Allergies   Advil [ibuprofen] and Aspirin   Review of Systems Review of Systems  Constitutional:  Negative for fever.  Respiratory:  Negative for shortness of breath.   Cardiovascular:  Negative for chest pain.  Gastrointestinal:  Negative for abdominal pain, diarrhea, nausea and vomiting.  Genitourinary:  Positive for dysuria. Negative for flank pain, genital sores, hematuria, menstrual problem, vaginal bleeding, vaginal discharge and vaginal pain.  Musculoskeletal:  Negative for back pain.  Skin:  Negative for rash.  Neurological:  Negative for dizziness, light-headedness and headaches.    Physical Exam Triage Vital Signs ED Triage Vitals  Enc Vitals Group     BP      Pulse      Resp      Temp      Temp src      SpO2      Weight      Height      Head Circumference      Peak Flow      Pain Score      Pain Loc      Pain Edu?      Excl. in [place]?    No data found.  Updated Vital  Signs BP (!) 147/83 (BP Location: Left Arm)   Pulse 79   Temp 98.7 F (37.1 C) (Oral)   Resp 18   SpO2 95%   Visual Acuity Right Eye Distance:   Left Eye Distance:   Bilateral Distance:    Right Eye Near:   Left Eye Near:    Bilateral Near:     Physical Exam Vitals and nursing note reviewed.  Constitutional:      Appearance: She is well-developed.     Comments: No acute distress  HENT:     Head: Normocephalic and atraumatic.     Nose: Nose normal.  Eyes:     Conjunctiva/sclera: Conjunctivae normal.  Cardiovascular:     Rate and Rhythm: Normal rate.  Pulmonary:     Effort: Pulmonary effort is normal. No respiratory distress.  Abdominal:     General: There is no distension.  Musculoskeletal:        General: Normal range of motion.     Cervical back: Neck supple.  Skin:    General: Skin is warm and dry.  Neurological:     Mental Status: She is alert and oriented to person, place, and time.     UC Treatments / Results  Labs (all labs ordered are listed, but only abnormal results are displayed) Labs Reviewed  POCT URINALYSIS DIP (MANUAL ENTRY) - Abnormal; Notable for the following components:      Result Value   Glucose, UA =500 (*)    Blood, UA trace-intact (*)    Protein Ur, POC =100 (*)    Nitrite, UA Positive (*)    Leukocytes, UA Small (1+) (*)    All other components within normal limits  URINE CULTURE    EKG   Radiology No results found.  Procedures Procedures (including critical care time)  Medications Ordered in UC Medications - No data to display  Initial Impression / Assessment and Plan / UC Course  I have reviewed the triage vital signs and the nursing notes.  Pertinent labs & imaging results that were available during my care of the patient were reviewed by me and considered in my medical decision making (see chart for details).     UA consistent with UTI-small leuks, positive nitrites, urine culture pending.  Placing on Omnicef,  will call with culture results and alter therapy as needed.  Discussed strict return precautions. Patient verbalized understanding and is agreeable with plan.  Final Clinical Impressions(s) / UC Diagnoses  Final diagnoses:  Dysuria     Discharge Instructions      Cefdinir/Omnicef twice daily x1 week Urine culture pending Troponin fluids Follow-up if not improving or worsening     ED Prescriptions     Medication Sig Dispense Auth. Provider   cefdinir (OMNICEF) 300 MG capsule Take 1 capsule (300 mg total) by mouth 2 (two) times daily for 7 days. 14 capsule Dariann Huckaba, [place] C, PA-C      PDMP not reviewed this encounter.   Si Jachim, [place] C, PA-C 09/29/20 1400

## 2020-09-29 NOTE — Discharge Instructions (Signed)
Cefdinir/Omnicef twice daily x1 week Urine culture pending Troponin fluids Follow-up if not improving or worsening

## 2020-09-29 NOTE — ED Triage Notes (Signed)
Patient presents to [facility] for evaluation of burning with urination x 2 days.  Some lower abdominal pressure.  Denies discharge, abdominal pain, hematuria.

## 2020-10-01 LAB — URINE CULTURE: Culture: >=100000 — AB

## 2020-10-02 ENCOUNTER — Ambulatory Visit
Admission: EM | Admit: 2020-10-02 | Discharge: 2020-10-02 | Disposition: A | Discharge status: Home / Self Care | Payer: Medicare Other | Attending: Emergency Medicine | Admitting: Emergency Medicine

## 2020-10-02 DIAGNOSIS — T3695XA Adverse effect of unspecified systemic antibiotic, initial encounter: Secondary | ICD-10-CM

## 2020-10-02 MED ORDER — NITROFURANTOIN MONOHYD MACRO 100 MG PO CAPS: 100.0000 mg | ORAL_CAPSULE | Freq: Two times a day (BID) | ORAL | 0 refills | Status: AC

## 2020-10-02 NOTE — Discharge Instructions (Signed)
Macrobid twice daily for 5 days Probiotics- Activia yogurt to help with diarrhea

## 2020-10-02 NOTE — ED Notes (Signed)
F/u visit- seen by provider

## 2020-10-02 NOTE — ED Provider Notes (Signed)
Patient presented for follow-up of visit on 9/16, reported diarrhea and poor tolerance of antibiotic initiated-Omnicef.  Culture has resistance to Cipro, ampicillin and Bactrim.  Patient has history of renal transplant.  On chart review has previously been on and tolerated Macrobid within the past 3 years.  Patient continues to have symptoms, will switch to Macrobid twice daily x5 days.   Janith Lima, PA-C 10/02/20 1501

## 2020-10-12 DIAGNOSIS — N185 Chronic kidney disease, stage 5: Secondary | ICD-10-CM | POA: Diagnosis not present

## 2020-10-12 DIAGNOSIS — Z1211 Encounter for screening for malignant neoplasm of colon: Secondary | ICD-10-CM | POA: Diagnosis not present

## 2020-10-12 DIAGNOSIS — E785 Hyperlipidemia, unspecified: Secondary | ICD-10-CM | POA: Diagnosis not present

## 2020-10-12 DIAGNOSIS — Z0000 Encounter for general adult medical examination without abnormal findings: Secondary | ICD-10-CM | POA: Diagnosis not present

## 2020-10-12 DIAGNOSIS — N2581 Secondary hyperparathyroidism of renal origin: Secondary | ICD-10-CM | POA: Diagnosis not present

## 2020-10-12 DIAGNOSIS — H6121 Impacted cerumen, right ear: Secondary | ICD-10-CM | POA: Diagnosis not present

## 2020-10-12 DIAGNOSIS — Z940 Kidney transplant status: Secondary | ICD-10-CM | POA: Diagnosis not present

## 2020-10-12 DIAGNOSIS — Z79899 Other long term (current) drug therapy: Secondary | ICD-10-CM | POA: Diagnosis not present

## 2020-10-12 DIAGNOSIS — E1122 Type 2 diabetes mellitus with diabetic chronic kidney disease: Secondary | ICD-10-CM | POA: Diagnosis not present

## 2020-10-12 DIAGNOSIS — R42 Dizziness and giddiness: Secondary | ICD-10-CM | POA: Diagnosis not present

## 2020-10-12 DIAGNOSIS — Z23 Encounter for immunization: Secondary | ICD-10-CM | POA: Diagnosis not present

## 2020-10-12 DIAGNOSIS — I10 Essential (primary) hypertension: Secondary | ICD-10-CM | POA: Diagnosis not present

## 2020-11-07 DIAGNOSIS — Z23 Encounter for immunization: Secondary | ICD-10-CM | POA: Diagnosis not present

## 2020-12-21 DIAGNOSIS — N390 Urinary tract infection, site not specified: Secondary | ICD-10-CM | POA: Diagnosis not present

## 2020-12-21 DIAGNOSIS — E876 Hypokalemia: Secondary | ICD-10-CM | POA: Diagnosis not present

## 2020-12-21 DIAGNOSIS — Z940 Kidney transplant status: Secondary | ICD-10-CM | POA: Diagnosis not present

## 2020-12-21 DIAGNOSIS — E785 Hyperlipidemia, unspecified: Secondary | ICD-10-CM | POA: Diagnosis not present

## 2020-12-21 DIAGNOSIS — I10 Essential (primary) hypertension: Secondary | ICD-10-CM | POA: Diagnosis not present

## 2020-12-21 DIAGNOSIS — E119 Type 2 diabetes mellitus without complications: Secondary | ICD-10-CM | POA: Diagnosis not present

## 2020-12-29 DIAGNOSIS — I10 Essential (primary) hypertension: Secondary | ICD-10-CM | POA: Diagnosis not present

## 2020-12-29 DIAGNOSIS — Z6824 Body mass index (BMI) 24.0-24.9, adult: Secondary | ICD-10-CM | POA: Diagnosis not present

## 2020-12-29 DIAGNOSIS — E119 Type 2 diabetes mellitus without complications: Secondary | ICD-10-CM | POA: Diagnosis not present

## 2020-12-29 DIAGNOSIS — Z940 Kidney transplant status: Secondary | ICD-10-CM | POA: Diagnosis not present

## 2021-01-23 ENCOUNTER — Ambulatory Visit
Admission: EM | Admit: 2021-01-23 | Discharge: 2021-01-23 | Disposition: A | Discharge status: Home / Self Care | Payer: Medicare Other | Attending: Emergency Medicine | Admitting: Emergency Medicine

## 2021-01-23 ENCOUNTER — Other Ambulatory Visit: Payer: Self-pay

## 2021-01-23 DIAGNOSIS — N390 Urinary tract infection, site not specified: Secondary | ICD-10-CM | POA: Diagnosis not present

## 2021-01-23 LAB — POCT URINALYSIS DIP (MANUAL ENTRY)
Glucose, UA: 100 mg/dL — AB
Ketones, POC UA: NEGATIVE mg/dL
Nitrite, UA: NEGATIVE
Protein Ur, POC: NEGATIVE mg/dL
Spec Grav, UA: 1.02 (ref 1.010–1.025)
Urobilinogen, UA: 0.2 E.U./dL
pH, UA: 6 (ref 5.0–8.0)

## 2021-01-23 MED ORDER — SULFAMETHOXAZOLE-TRIMETHOPRIM 800-160 MG PO TABS: 1.0000 | ORAL_TABLET | Freq: Two times a day (BID) | ORAL | 0 refills | Status: AC

## 2021-01-23 NOTE — Discharge Instructions (Addendum)
The urinalysis that we performed today was abnormal.  Urine culture will be performed per our protocol.  The results of urine culture should be available in the next 3 to 5 days and will be posted to your MyChart account.  If there are any abnormal results, you will be contacted by phone and if further treatment is needed, this will be provided for you. ° °If you were advised to begin antibiotics today, it is because you are having active symptoms of a urinary tract infection.  It is important that you complete the course of antibiotics as prescribed despite the results of your urine culture.   ° °The most common reason for a negative urine culture, despite having active symptoms of urinary tract infection, is that a truly reliable urine sample can only be obtained with the first urination of the day.  That early morning urine sample is very concentrated so it is most likely to have a significant amount of bacteria, enough to provide a positive urine culture.  Throughout the day, as you drink fluids and consume foods, your urine becomes more diluted.  Diluted urine decreases the likelihood of a negative urine culture. ° °Therefore, if you begin to have significant relief of your symptoms when you begin the antibiotics but receive a phone call stating that your urine culture was negative, please trust the improvement of your symptoms as a sign that the antibiotics have been effective and that you should complete the entire course. ° ° °

## 2021-01-23 NOTE — ED Triage Notes (Signed)
Pt present urinary frequency with pressure and burning sensation while urinating. Symptoms started two days ago.

## 2021-01-23 NOTE — ED Provider Notes (Signed)
[facility]    CSN: [id number] Arrival date & time: 01/23/21  1052    HISTORY   Chief Complaint  Patient presents with   Urinary Frequency   HPI [name] is a 76 y.o. female. Pt present urinary frequency with pressure and burning sensation while urinating. Symptoms started two days ago.  Patient denies fever, aches, chills, history of constipation, nausea, vomiting, diarrhea, vaginal discharge, genital lesion.  Patient has history of renal transplant..  Patient states she had a urinary tract infection in the past.  Urine dip today was remarkable for protein, blood and white blood cells.  The history is provided by the patient.  Past Medical History:  Diagnosis Date   Hx of tubal ligation t   Hypertension    Renal insufficiency    Patient Active Problem List   Diagnosis Date Noted   Type 2 diabetes mellitus with complication ([place]) 28/36/6294   Past Surgical History:  Procedure Laterality Date   KIDNEY TRANSPLANT     PERITONEAL CATHETER INSERTION     PERITONEAL CATHETER REMOVAL     OB History   No obstetric history on file.    Home Medications    Prior to Admission medications   Medication Sig Start Date End Date Taking? Authorizing Provider  atorvastatin (LIPITOR) 20 MG tablet Take 20 mg by mouth daily.    [provider]  chlorthalidone (HYGROTON) 25 MG tablet Take 25 mg by mouth daily.    [provider]  cloNIDine (CATAPRES) 0.1 MG tablet Take 0.1 mg by mouth 3 (three) times daily.      [provider]  estradiol (VIVELLE-DOT) 0.025 MG/24HR Place 1 patch onto the skin 2 (two) times a week.      [provider]  fluticasone (FLONASE) 50 MCG/ACT nasal spray Place 2 sprays into the nose daily. 01/13/11 01/13/12  Domenic Moras, PA-C  Insulin Degludec (TRESIBA FLEXTOUCH [place]) Inject 24 Units into the skin daily.    [provider]  magnesium oxide (MAG-OX) 400 MG tablet Take 400 mg by mouth daily.    [provider]  mycophenolate (MYFORTIC) 180 MG EC tablet Take 180 mg by mouth 2 (two) times daily.    [provider]  potassium chloride SA (K-DUR,KLOR-CON) 20 MEQ tablet Take 20 mEq by mouth 2 (two) times daily.    [provider]  PRESCRIPTION MEDICATION Take 0.5 tablets by mouth daily. Nor syndrome hormonal tablet     [provider]  sevelamer (RENVELA) 800 MG tablet Take 1,600 mg by mouth 3 (three) times daily with meals.      [provider]  sulfamethoxazole-trimethoprim (BACTRIM DS) 800-160 MG tablet Take 1 tablet by mouth 2 (two) times daily for 5 days. 01/23/21 01/28/21 Yes Lynden Oxford Scales, PA-C  tacrolimus (PROGRAF) 1 MG capsule Take 1 mg by mouth 2 (two) times daily.    [provider]   Family History Family History  Problem Relation Age of Onset   Healthy Mother    Cancer Father    Social History Social History   Tobacco Use   Smoking status: Never   Smokeless tobacco: Never  Substance Use Topics   Alcohol use: No   Drug use: No   Allergies   Advil [ibuprofen], Aspirin, Nitrofurantoin, and Nitrofurantoin macrocrystal  Review of Systems Review of Systems Pertinent findings noted in history of present illness.   Physical Exam Triage Vital Signs ED Triage Vitals  Enc Vitals Group  BP 11/10/20 0827 (!) 147/82     Pulse Rate 11/10/20 0827 72     Resp 11/10/20 0827 18     Temp 11/10/20 0827 98.3 F (36.8 C)     Temp Source 11/10/20 0827 Oral     SpO2 11/10/20 0827 98 %     Weight --      Height --      Head Circumference --      Peak Flow --      Pain Score 11/10/20 0826 5     Pain Loc --      Pain Edu? --      Excl. in [place]? --   No data found.  Updated Vital Signs BP 111/70 (BP Location: Right Arm)    Pulse 73    Temp 98.8 F (37.1 C) (Oral)    Resp 16    SpO2 94%   Physical Exam Vitals and nursing note reviewed.  Constitutional:      General: She is not in acute distress.    Appearance: Normal  appearance. She is not ill-appearing.  HENT:     Head: Normocephalic and atraumatic.  Eyes:     General: Lids are normal.        Right eye: No discharge.        Left eye: No discharge.     Extraocular Movements: Extraocular movements intact.     Conjunctiva/sclera: Conjunctivae normal.     Right eye: Right conjunctiva is not injected.     Left eye: Left conjunctiva is not injected.  Neck:     Trachea: Trachea and phonation normal.  Cardiovascular:     Rate and Rhythm: Normal rate and regular rhythm.     Pulses: Normal pulses.     Heart sounds: Normal heart sounds. No murmur heard.   No friction rub. No gallop.  Pulmonary:     Effort: Pulmonary effort is normal. No accessory muscle usage, prolonged expiration or respiratory distress.     Breath sounds: Normal breath sounds. No stridor, decreased air movement or transmitted upper airway sounds. No decreased breath sounds, wheezing, rhonchi or rales.  Chest:     Chest wall: No tenderness.  Abdominal:     General: Abdomen is flat. Bowel sounds are normal. There is no distension.     Palpations: Abdomen is soft.     Tenderness: There is abdominal tenderness in the suprapubic area. There is no right CVA tenderness or left CVA tenderness.     Hernia: No hernia is present.  Musculoskeletal:        General: Normal range of motion.     Cervical back: Normal range of motion and neck supple. Normal range of motion.  Lymphadenopathy:     Cervical: No cervical adenopathy.  Skin:    General: Skin is warm and dry.     Findings: No erythema or rash.  Neurological:     General: No focal deficit present.     Mental Status: She is alert and oriented to person, place, and time.  Psychiatric:        Mood and Affect: Mood normal.        Behavior: Behavior normal.    Visual Acuity Right Eye Distance:   Left Eye Distance:   Bilateral Distance:    Right Eye Near:   Left Eye Near:    Bilateral Near:     UC Couse / Diagnostics / Procedures:     EKG  Radiology No results found.  Procedures  Procedures (including critical care time)  UC Diagnoses / Final Clinical Impressions(s)   I have reviewed the triage vital signs and the nursing notes.  Pertinent labs & imaging results that were available during my care of the patient were reviewed by me and considered in my medical decision making (see chart for details).    Final diagnoses:  Acute lower urinary tract infection   Urine dip today was abnormal.  Urine culture will be performed per our protocol.  Patient advised that they will be contacted with results and that adjustments to treatment will be provided as indicated based on the results.    Patient was advised of possibility that urine culture results may be negative if sample provided was obtained late in the day causing urine to be more diluted.  Patient was advised that if antibiotics were effective after the first 24 to 36 hours, despite negative urine culture result, it is recommended that they complete the full course as prescribed.  Return precautions advised.  Drug allergies reviewed, all questions addressed.  ED Prescriptions     Medication Sig Dispense Auth. Provider   sulfamethoxazole-trimethoprim (BACTRIM DS) 800-160 MG tablet Take 1 tablet by mouth 2 (two) times daily for 5 days. 10 tablet Lynden Oxford Scales, PA-C      PDMP not reviewed this encounter.  Pending results:  Labs Reviewed  POCT URINALYSIS DIP (MANUAL ENTRY) - Abnormal; Notable for the following components:      Result Value   Clarity, UA cloudy (*)    Glucose, UA =100 (*)    Bilirubin, UA large (*)    Blood, UA small (*)    Leukocytes, UA Moderate (2+) (*)    All other components within normal limits  URINE CULTURE    Medications Ordered in UC: Medications - No data to display  Disposition Upon Discharge:  Condition: stable for discharge home Home: take medications as prescribed; routine discharge instructions as discussed;  follow up as advised.  Patient presented with an acute illness with associated systemic symptoms and significant discomfort requiring urgent management. In my opinion, this is a condition that a prudent lay person (someone who possesses an average knowledge of health and medicine) may potentially expect to result in complications if not addressed urgently such as respiratory distress, impairment of bodily function or dysfunction of bodily organs.   Routine symptom specific, illness specific and/or disease specific instructions were discussed with the patient and/or caregiver at length.   As such, the patient has been evaluated and assessed, work-up was performed and treatment was provided in alignment with urgent care protocols and evidence based medicine.  Patient/parent/caregiver has been advised that the patient may require follow up for further testing and treatment if the symptoms continue in spite of treatment, as clinically indicated and appropriate.  Patient/parent/caregiver has been advised to return to the [facility] or PCP in 3-5 days if no better; to PCP or the Emergency Department if new signs and symptoms develop, or if the current signs or symptoms continue to change or worsen for further workup, evaluation and treatment as clinically indicated and appropriate  The patient will follow up with their current PCP if and as advised. If the patient does not currently have a PCP we will assist them in obtaining one.   The patient may need specialty follow up if the symptoms continue, in spite of conservative treatment and management, for further workup, evaluation, consultation and treatment as clinically indicated and appropriate.  Patient/parent/caregiver verbalized understanding and agreement  of plan as discussed.  All questions were addressed during visit.  Please see discharge instructions below for further details of plan.  Discharge Instructions:   Discharge Instructions      The  urinalysis that we performed today was abnormal.  Urine culture will be performed per our protocol.  The results of urine culture should be available in the next 3 to 5 days and will be posted to your MyChart account.  If there are any abnormal results, you will be contacted by phone and if further treatment is needed, this will be provided for you.  If you were advised to begin antibiotics today, it is because you are having active symptoms of a urinary tract infection.  It is important that you complete the course of antibiotics as prescribed despite the results of your urine culture.    The most common reason for a negative urine culture, despite having active symptoms of urinary tract infection, is that a truly reliable urine sample can only be obtained with the first urination of the day.  That early morning urine sample is very concentrated so it is most likely to have a significant amount of bacteria, enough to provide a positive urine culture.  Throughout the day, as you drink fluids and consume foods, your urine becomes more diluted.  Diluted urine decreases the likelihood of a negative urine culture.  Therefore, if you begin to have significant relief of your symptoms when you begin the antibiotics but receive a phone call stating that your urine culture was negative, please trust the improvement of your symptoms as a sign that the antibiotics have been effective and that you should complete the entire course.        This office note has been dictated using Museum/gallery curator.  Unfortunately, and despite my best efforts, this method of dictation can sometimes lead to occasional typographical or grammatical errors.  I apologize in advance if this occurs.      Lynden Oxford Scales, PA-C 01/23/21 1215

## 2021-01-26 ENCOUNTER — Telehealth (HOSPITAL_COMMUNITY): Payer: Self-pay | Admitting: Emergency Medicine

## 2021-01-26 LAB — URINE CULTURE: Culture: >=100000 — AB

## 2021-01-26 MED ORDER — CEPHALEXIN 500 MG PO CAPS: 500.0000 mg | ORAL_CAPSULE | Freq: Two times a day (BID) | ORAL | 0 refills | Status: AC

## 2021-04-09 DIAGNOSIS — D849 Immunodeficiency, unspecified: Secondary | ICD-10-CM | POA: Diagnosis not present

## 2021-04-09 DIAGNOSIS — Z87440 Personal history of urinary (tract) infections: Secondary | ICD-10-CM | POA: Diagnosis not present

## 2021-04-09 DIAGNOSIS — Z792 Long term (current) use of antibiotics: Secondary | ICD-10-CM | POA: Diagnosis not present

## 2021-04-09 DIAGNOSIS — I10 Essential (primary) hypertension: Secondary | ICD-10-CM | POA: Diagnosis not present

## 2021-04-09 DIAGNOSIS — E785 Hyperlipidemia, unspecified: Secondary | ICD-10-CM | POA: Diagnosis not present

## 2021-04-09 DIAGNOSIS — E118 Type 2 diabetes mellitus with unspecified complications: Secondary | ICD-10-CM | POA: Diagnosis not present

## 2021-04-09 DIAGNOSIS — Z794 Long term (current) use of insulin: Secondary | ICD-10-CM | POA: Diagnosis not present

## 2021-04-09 DIAGNOSIS — Z7985 Long-term (current) use of injectable non-insulin antidiabetic drugs: Secondary | ICD-10-CM | POA: Diagnosis not present

## 2021-04-09 DIAGNOSIS — Z79621 Long term (current) use of calcineurin inhibitor: Secondary | ICD-10-CM | POA: Diagnosis not present

## 2021-04-09 DIAGNOSIS — Z940 Kidney transplant status: Secondary | ICD-10-CM | POA: Diagnosis not present

## 2021-04-09 DIAGNOSIS — K219 Gastro-esophageal reflux disease without esophagitis: Secondary | ICD-10-CM | POA: Diagnosis not present

## 2021-04-09 DIAGNOSIS — Z7969 Long term (current) use of other immunomodulators and immunosuppressants: Secondary | ICD-10-CM | POA: Diagnosis not present

## 2021-04-09 DIAGNOSIS — Z5181 Encounter for therapeutic drug level monitoring: Secondary | ICD-10-CM | POA: Diagnosis not present

## 2021-04-09 DIAGNOSIS — Z7984 Long term (current) use of oral hypoglycemic drugs: Secondary | ICD-10-CM | POA: Diagnosis not present

## 2021-04-09 DIAGNOSIS — E119 Type 2 diabetes mellitus without complications: Secondary | ICD-10-CM | POA: Diagnosis not present

## 2021-04-09 DIAGNOSIS — Z4822 Encounter for aftercare following kidney transplant: Secondary | ICD-10-CM | POA: Diagnosis not present

## 2021-04-09 DIAGNOSIS — Z7952 Long term (current) use of systemic steroids: Secondary | ICD-10-CM | POA: Diagnosis not present

## 2021-05-02 DIAGNOSIS — Z940 Kidney transplant status: Secondary | ICD-10-CM | POA: Diagnosis not present

## 2021-05-02 DIAGNOSIS — E119 Type 2 diabetes mellitus without complications: Secondary | ICD-10-CM | POA: Diagnosis not present

## 2021-05-08 DIAGNOSIS — Z940 Kidney transplant status: Secondary | ICD-10-CM | POA: Diagnosis not present

## 2021-05-08 DIAGNOSIS — E785 Hyperlipidemia, unspecified: Secondary | ICD-10-CM | POA: Diagnosis not present

## 2021-05-08 DIAGNOSIS — E876 Hypokalemia: Secondary | ICD-10-CM | POA: Diagnosis not present

## 2021-05-08 DIAGNOSIS — E119 Type 2 diabetes mellitus without complications: Secondary | ICD-10-CM | POA: Diagnosis not present

## 2021-05-08 DIAGNOSIS — I10 Essential (primary) hypertension: Secondary | ICD-10-CM | POA: Diagnosis not present

## 2021-06-16 DIAGNOSIS — Z23 Encounter for immunization: Secondary | ICD-10-CM | POA: Diagnosis not present

## 2021-06-28 DIAGNOSIS — Z940 Kidney transplant status: Secondary | ICD-10-CM | POA: Diagnosis not present

## 2021-06-28 DIAGNOSIS — I10 Essential (primary) hypertension: Secondary | ICD-10-CM | POA: Diagnosis not present

## 2021-06-28 DIAGNOSIS — E119 Type 2 diabetes mellitus without complications: Secondary | ICD-10-CM | POA: Diagnosis not present

## 2021-08-22 DIAGNOSIS — E119 Type 2 diabetes mellitus without complications: Secondary | ICD-10-CM | POA: Diagnosis not present

## 2021-08-22 DIAGNOSIS — H2513 Age-related nuclear cataract, bilateral: Secondary | ICD-10-CM | POA: Diagnosis not present

## 2021-10-06 DIAGNOSIS — Z23 Encounter for immunization: Secondary | ICD-10-CM | POA: Diagnosis not present

## 2021-10-15 ENCOUNTER — Encounter (HOSPITAL_COMMUNITY): Payer: Self-pay

## 2021-10-15 ENCOUNTER — Telehealth (HOSPITAL_COMMUNITY): Payer: Self-pay | Admitting: Emergency Medicine

## 2021-10-15 ENCOUNTER — Ambulatory Visit (HOSPITAL_COMMUNITY)
Admission: EM | Admit: 2021-10-15 | Discharge: 2021-10-15 | Disposition: A | Discharge status: Home / Self Care | Payer: Medicare Other | Attending: Internal Medicine | Admitting: Internal Medicine

## 2021-10-15 DIAGNOSIS — Z940 Kidney transplant status: Secondary | ICD-10-CM | POA: Diagnosis not present

## 2021-10-15 DIAGNOSIS — N390 Urinary tract infection, site not specified: Secondary | ICD-10-CM | POA: Insufficient documentation

## 2021-10-15 DIAGNOSIS — E119 Type 2 diabetes mellitus without complications: Secondary | ICD-10-CM | POA: Diagnosis not present

## 2021-10-15 LAB — POCT URINALYSIS DIPSTICK, ED / UC
Bilirubin Urine: NEGATIVE
Glucose, UA: NEGATIVE mg/dL
Ketones, ur: NEGATIVE mg/dL
Nitrite: NEGATIVE
Protein, ur: >=300 mg/dL — AB
Specific Gravity, Urine: 1.015 (ref 1.005–1.030)
Urobilinogen, UA: 0.2 mg/dL (ref 0.0–1.0)
pH: 6 (ref 5.0–8.0)

## 2021-10-15 MED ORDER — CEPHALEXIN 500 MG PO CAPS: 500.0000 mg | ORAL_CAPSULE | Freq: Three times a day (TID) | ORAL | 0 refills | Status: AC

## 2021-10-15 MED ORDER — SULFAMETHOXAZOLE-TRIMETHOPRIM 800-160 MG PO TABS: 1.0000 | ORAL_TABLET | Freq: Two times a day (BID) | ORAL | 0 refills | Status: DC

## 2021-10-15 NOTE — ED Triage Notes (Signed)
Pt is here for possible UTI  X 3DAYS  

## 2021-10-15 NOTE — Discharge Instructions (Addendum)
Please take medication as prescribed. I recommend to take with food to avoid upset stomach.  Increase your fluid intake. Follow up with your kidney doctor.  Please go to the emergency department if symptoms worsen.

## 2021-10-15 NOTE — ED Provider Notes (Signed)
[place]    CSN: [id number] Arrival date & time: 10/15/21  0907      History   Chief Complaint Chief Complaint  Patient presents with   Dysuria    HPI [name] is a 76 y.o. female.  Presents with 3 day history of dysuria, frequency, urgency. Denies abdominal pain, back pain, fever Denies hematuria History of UTIs, most recent 8 months ago  Right kidney transplant about 10 years ago Sees her kidney specialist in 3 days  Past Medical History:  Diagnosis Date   Hx of tubal ligation t   Hypertension    Renal insufficiency     Patient Active Problem List   Diagnosis Date Noted   Type 2 diabetes mellitus with complication ([place]) 26/20/3559    Past Surgical History:  Procedure Laterality Date   KIDNEY TRANSPLANT     PERITONEAL CATHETER INSERTION     PERITONEAL CATHETER REMOVAL      OB History   No obstetric history on file.      Home Medications    Prior to Admission medications   Medication Sig Start Date End Date Taking? Authorizing Provider  sulfamethoxazole-trimethoprim (BACTRIM DS) 800-160 MG tablet Take 1 tablet by mouth 2 (two) times daily for 3 days. 10/15/21 10/18/21 Yes Javarious Elsayed, Wells Guiles, PA-C  atorvastatin (LIPITOR) 20 MG tablet Take 20 mg by mouth daily.    [provider]  chlorthalidone (HYGROTON) 25 MG tablet Take 25 mg by mouth daily.    [provider]  cloNIDine (CATAPRES) 0.1 MG tablet Take 0.1 mg by mouth 3 (three) times daily.      [provider]  estradiol (VIVELLE-DOT) 0.025 MG/24HR Place 1 patch onto the skin 2 (two) times a week.      [provider]  fluticasone (FLONASE) 50 MCG/ACT nasal spray Place 2 sprays into the nose daily. 01/13/11 01/13/12  Domenic Moras, PA-C  Insulin Degludec (TRESIBA FLEXTOUCH [place]) Inject 24 Units into the skin daily.    [provider]  magnesium oxide (MAG-OX) 400 MG tablet Take 400 mg by mouth daily.    [provider]  mycophenolate  (MYFORTIC) 180 MG EC tablet Take 180 mg by mouth 2 (two) times daily.    [provider]  potassium chloride SA (K-DUR,KLOR-CON) 20 MEQ tablet Take 20 mEq by mouth 2 (two) times daily.    [provider]  PRESCRIPTION MEDICATION Take 0.5 tablets by mouth daily. Nor syndrome hormonal tablet     [provider]  sevelamer (RENVELA) 800 MG tablet Take 1,600 mg by mouth 3 (three) times daily with meals.      [provider]  tacrolimus (PROGRAF) 1 MG capsule Take 1 mg by mouth 2 (two) times daily.    [provider]    Family History Family History  Problem Relation Age of Onset   Healthy Mother    Cancer Father     Social History Social History   Tobacco Use   Smoking status: Never   Smokeless tobacco: Never  Substance Use Topics   Alcohol use: No   Drug use: No     Allergies   Advil [ibuprofen], Aspirin, Nitrofurantoin, and Nitrofurantoin macrocrystal   Review of Systems Review of Systems  Genitourinary:  Positive for dysuria.   Per HPI  Physical Exam Triage Vital Signs ED Triage Vitals  Enc Vitals Group     BP 10/15/21 0949 (!) 151/79     Pulse Rate 10/15/21 0949 82  Resp 10/15/21 0949 12     Temp 10/15/21 0949 98 F (36.7 C)     Temp Source 10/15/21 0949 Oral     SpO2 10/15/21 0949 99 %     Weight --      Height --      Head Circumference --      Peak Flow --      Pain Score 10/15/21 0954 4     Pain Loc --      Pain Edu? --      Excl. in [place]? --    No data found.  Updated Vital Signs BP (!) 151/79 (BP Location: Left Arm)   Pulse 82   Temp 98 F (36.7 C) (Oral)   Resp 12   SpO2 99%    Physical Exam Vitals and nursing note reviewed.  Constitutional:      General: She is not in acute distress. HENT:     Mouth/Throat:     Mouth: Mucous membranes are moist.     Pharynx: Oropharynx is clear.  Eyes:     Conjunctiva/sclera: Conjunctivae normal.     Pupils: Pupils are equal, round, and reactive to  light.  Cardiovascular:     Rate and Rhythm: Normal rate and regular rhythm.     Heart sounds: Normal heart sounds.  Pulmonary:     Effort: Pulmonary effort is normal.     Breath sounds: Normal breath sounds.  Abdominal:     General: Bowel sounds are normal.     Palpations: Abdomen is soft.     Tenderness: There is no abdominal tenderness. There is no right CVA tenderness, left CVA tenderness, guarding or rebound.  Neurological:     Mental Status: She is alert and oriented to person, place, and time.      UC Treatments / Results  Labs (all labs ordered are listed, but only abnormal results are displayed) Labs Reviewed  POCT URINALYSIS DIPSTICK, ED / UC - Abnormal; Notable for the following components:      Result Value   Hgb urine dipstick MODERATE (*)    Protein, ur >=300 (*)    Leukocytes,Ua SMALL (*)    All other components within normal limits  URINE CULTURE    EKG   Radiology No results found.  Procedures Procedures (including critical care time)  Medications Ordered in UC Medications - No data to display  Initial Impression / Assessment and Plan / UC Course  I have reviewed the triage vital signs and the nursing notes.  Pertinent labs & imaging results that were available during my care of the patient were reviewed by me and considered in my medical decision making (see chart for details).  Urinalysis with small leuks, moderate hemoglobin.  Will culture. With symptoms and kidney transplant history, will treat for acute cystitis with Bactrim twice daily for 3 days.  She does have follow-up with her kidney specialist on 10/5.  Discussed increasing fluids, monitoring symptoms.  Understands to go to the ED if symptoms worsen.  Final Clinical Impressions(s) / UC Diagnoses   Final diagnoses:  Lower urinary tract infectious disease  History of kidney transplant     Discharge Instructions      Please take medication as prescribed. I recommend to take with  food to avoid upset stomach.  Increase your fluid intake. Follow up with your kidney doctor.  Please go to the emergency department if symptoms worsen.    ED Prescriptions     Medication Sig Dispense Auth.  Provider   sulfamethoxazole-trimethoprim (BACTRIM DS) 800-160 MG tablet Take 1 tablet by mouth 2 (two) times daily for 3 days. 6 tablet Taylore Hinde, Wells Guiles, PA-C      PDMP not reviewed this encounter.   Zamira Hickam, Wells Guiles, [place] 10/15/21 1036

## 2021-10-15 NOTE — Telephone Encounter (Signed)
Changed from bactrim to keflex per patient request, history of bactrim resistance on culture

## 2021-10-17 LAB — URINE CULTURE: Culture: >=100000 — AB

## 2021-10-18 DIAGNOSIS — N390 Urinary tract infection, site not specified: Secondary | ICD-10-CM | POA: Diagnosis not present

## 2021-10-18 DIAGNOSIS — E876 Hypokalemia: Secondary | ICD-10-CM | POA: Diagnosis not present

## 2021-10-18 DIAGNOSIS — Z940 Kidney transplant status: Secondary | ICD-10-CM | POA: Diagnosis not present

## 2021-10-18 DIAGNOSIS — E785 Hyperlipidemia, unspecified: Secondary | ICD-10-CM | POA: Diagnosis not present

## 2021-10-18 DIAGNOSIS — E119 Type 2 diabetes mellitus without complications: Secondary | ICD-10-CM | POA: Diagnosis not present

## 2021-10-18 DIAGNOSIS — I10 Essential (primary) hypertension: Secondary | ICD-10-CM | POA: Diagnosis not present

## 2021-10-30 DIAGNOSIS — N186 End stage renal disease: Secondary | ICD-10-CM | POA: Diagnosis not present

## 2021-10-30 DIAGNOSIS — J309 Allergic rhinitis, unspecified: Secondary | ICD-10-CM | POA: Diagnosis not present

## 2021-10-30 DIAGNOSIS — N2581 Secondary hyperparathyroidism of renal origin: Secondary | ICD-10-CM | POA: Diagnosis not present

## 2021-10-30 DIAGNOSIS — F330 Major depressive disorder, recurrent, mild: Secondary | ICD-10-CM | POA: Diagnosis not present

## 2021-10-30 DIAGNOSIS — E785 Hyperlipidemia, unspecified: Secondary | ICD-10-CM | POA: Diagnosis not present

## 2021-10-30 DIAGNOSIS — N185 Chronic kidney disease, stage 5: Secondary | ICD-10-CM | POA: Diagnosis not present

## 2021-10-30 DIAGNOSIS — Z0000 Encounter for general adult medical examination without abnormal findings: Secondary | ICD-10-CM | POA: Diagnosis not present

## 2021-10-30 DIAGNOSIS — E1122 Type 2 diabetes mellitus with diabetic chronic kidney disease: Secondary | ICD-10-CM | POA: Diagnosis not present

## 2021-10-30 DIAGNOSIS — Z79899 Other long term (current) drug therapy: Secondary | ICD-10-CM | POA: Diagnosis not present

## 2021-10-30 DIAGNOSIS — N390 Urinary tract infection, site not specified: Secondary | ICD-10-CM | POA: Diagnosis not present

## 2021-10-30 DIAGNOSIS — Z940 Kidney transplant status: Secondary | ICD-10-CM | POA: Diagnosis not present

## 2021-10-30 DIAGNOSIS — Z23 Encounter for immunization: Secondary | ICD-10-CM | POA: Diagnosis not present

## 2021-10-30 DIAGNOSIS — I10 Essential (primary) hypertension: Secondary | ICD-10-CM | POA: Diagnosis not present

## 2021-12-28 DIAGNOSIS — E119 Type 2 diabetes mellitus without complications: Secondary | ICD-10-CM | POA: Diagnosis not present

## 2021-12-28 DIAGNOSIS — I10 Essential (primary) hypertension: Secondary | ICD-10-CM | POA: Diagnosis not present

## 2021-12-28 DIAGNOSIS — Z940 Kidney transplant status: Secondary | ICD-10-CM | POA: Diagnosis not present

## 2022-01-01 ENCOUNTER — Ambulatory Visit
Admission: EM | Admit: 2022-01-01 | Discharge: 2022-01-01 | Disposition: A | Discharge status: Home / Self Care | Payer: Medicare Other | Attending: Internal Medicine | Admitting: Internal Medicine

## 2022-01-01 DIAGNOSIS — N390 Urinary tract infection, site not specified: Secondary | ICD-10-CM | POA: Insufficient documentation

## 2022-01-01 LAB — POCT URINALYSIS DIP (MANUAL ENTRY)
Bilirubin, UA: NEGATIVE
Glucose, UA: NEGATIVE mg/dL
Ketones, POC UA: NEGATIVE mg/dL
Nitrite, UA: POSITIVE — AB
Protein Ur, POC: >=300 mg/dL — AB
Spec Grav, UA: 1.02 (ref 1.010–1.025)
Urobilinogen, UA: 0.2 E.U./dL
pH, UA: 6.5 (ref 5.0–8.0)

## 2022-01-01 MED ORDER — CEPHALEXIN 500 MG PO CAPS: 500.0000 mg | ORAL_CAPSULE | Freq: Two times a day (BID) | ORAL | 0 refills | Status: DC

## 2022-01-01 NOTE — ED Provider Notes (Signed)
[facility]  Note:  This document was prepared using Systems analyst and may include unintentional dictation errors.  MRN: [id number] DOB: 1945/12/16  Subjective:   [name] is a 76 y.o. female presenting for 4-day history of acute onset dysuria, urinary frequency and pressure. Drinks diet sodas daily.  Has history of UTIs.  Last episode showed multiple resistances to antibiotics.  Denies fever, flank pain, hematuria.  Last creatinine level was 0.78, GFR greater than 79 from 04/09/2021.  No current facility-administered medications for this encounter.  Current Outpatient Medications:    atorvastatin (LIPITOR) 20 MG tablet, Take 20 mg by mouth daily., Disp: , Rfl:    chlorthalidone (HYGROTON) 25 MG tablet, Take 25 mg by mouth daily., Disp: , Rfl:    cloNIDine (CATAPRES) 0.1 MG tablet, Take 0.1 mg by mouth 3 (three) times daily.  , Disp: , Rfl:    estradiol (VIVELLE-DOT) 0.025 MG/24HR, Place 1 patch onto the skin 2 (two) times a week.  , Disp: , Rfl:    fluticasone (FLONASE) 50 MCG/ACT nasal spray, Place 2 sprays into the nose daily., Disp: 16 g, Rfl: 0   Insulin Degludec (TRESIBA FLEXTOUCH [place]), Inject 24 Units into the skin daily., Disp: , Rfl:    magnesium oxide (MAG-OX) 400 MG tablet, Take 400 mg by mouth daily., Disp: , Rfl:    mycophenolate (MYFORTIC) 180 MG EC tablet, Take 180 mg by mouth 2 (two) times daily., Disp: , Rfl:    potassium chloride SA (K-DUR,KLOR-CON) 20 MEQ tablet, Take 20 mEq by mouth 2 (two) times daily., Disp: , Rfl:    PRESCRIPTION MEDICATION, Take 0.5 tablets by mouth daily. Nor syndrome hormonal tablet , Disp: , Rfl:    sevelamer (RENVELA) 800 MG tablet, Take 1,600 mg by mouth 3 (three) times daily with meals.  , Disp: , Rfl:    tacrolimus (PROGRAF) 1 MG capsule, Take 1 mg by mouth 2 (two) times daily., Disp: , Rfl:    Allergies  Allergen Reactions   Advil [Ibuprofen] Nausea And Vomiting   Aspirin Nausea And  Vomiting   Nitrofurantoin Nausea Only   Nitrofurantoin Macrocrystal Nausea Only    Past Medical History:  Diagnosis Date   Hx of tubal ligation t   Hypertension    Renal insufficiency      Past Surgical History:  Procedure Laterality Date   KIDNEY TRANSPLANT     PERITONEAL CATHETER INSERTION     PERITONEAL CATHETER REMOVAL      Family History  Problem Relation Age of Onset   Healthy Mother    Cancer Father     Social History   Tobacco Use   Smoking status: Never   Smokeless tobacco: Never  Vaping Use   Vaping Use: Never used  Substance Use Topics   Alcohol use: No   Drug use: No    ROS   Objective:   Vitals: BP 128/70 (BP Location: Right Arm)   Pulse 74   Temp 98.2 F (36.8 C) (Oral)   Resp 16   SpO2 96%   Physical Exam Constitutional:      General: She is not in acute distress.    Appearance: Normal appearance. She is well-developed. She is not ill-appearing, toxic-appearing or diaphoretic.  HENT:     Head: Normocephalic and atraumatic.     Nose: Nose normal.     Mouth/Throat:     Mouth: Mucous membranes are moist.     Pharynx: Oropharynx is clear.  Eyes:     General: No scleral icterus.       Right eye: No discharge.        Left eye: No discharge.     Extraocular Movements: Extraocular movements intact.     Conjunctiva/sclera: Conjunctivae normal.  Cardiovascular:     Rate and Rhythm: Normal rate.  Pulmonary:     Effort: Pulmonary effort is normal.  Abdominal:     General: Bowel sounds are normal. There is no distension.     Palpations: Abdomen is soft. There is no mass.     Tenderness: There is no abdominal tenderness. There is no right CVA tenderness, left CVA tenderness, guarding or rebound.  Skin:    General: Skin is warm and dry.  Neurological:     General: No focal deficit present.     Mental Status: She is alert and oriented to person, place, and time.  Psychiatric:        Mood and Affect: Mood normal.        Behavior: Behavior  normal.        Thought Content: Thought content normal.        Judgment: Judgment normal.     Results for orders placed or performed during the hospital encounter of 01/01/22 (from the past 24 hour(s))  POCT urinalysis dipstick     Status: Abnormal   Collection Time: 01/01/22 10:07 AM  Result Value Ref Range   Color, UA yellow yellow   Clarity, UA clear clear   Glucose, UA negative negative mg/dL   Bilirubin, UA negative negative   Ketones, POC UA negative negative mg/dL   Spec Grav, UA 1.020 1.010 - 1.025   Blood, UA small (A) negative   pH, UA 6.5 5.0 - 8.0   Protein Ur, POC >=300 (A) negative mg/dL   Urobilinogen, UA 0.2 0.2 or 1.0 E.U./dL   Nitrite, UA Positive (A) Negative   Leukocytes, UA Small (1+) (A) Negative    Assessment and Plan :   PDMP not reviewed this encounter.  1. Recurrent UTI     Start Keflex to cover for acute cystitis, urine culture pending.  Recommended aggressive hydration, limiting urinary irritants. Counseled patient on potential for adverse effects with medications prescribed/recommended today, ER and return-to-clinic precautions discussed, patient verbalized understanding.    Jaynee Eagles, PA-C 01/01/22 1828

## 2022-01-01 NOTE — ED Triage Notes (Signed)
Pt c/o dysuria and pressure x 4 days-NAD-steady gait

## 2022-01-01 NOTE — Discharge Instructions (Signed)

## 2022-01-03 LAB — URINE CULTURE: Culture: >=100000 — AB

## 2022-02-28 DIAGNOSIS — Z940 Kidney transplant status: Secondary | ICD-10-CM | POA: Diagnosis not present

## 2022-02-28 DIAGNOSIS — R799 Abnormal finding of blood chemistry, unspecified: Secondary | ICD-10-CM | POA: Diagnosis not present

## 2022-03-01 ENCOUNTER — Ambulatory Visit
Admission: EM | Admit: 2022-03-01 | Discharge: 2022-03-01 | Disposition: A | Discharge status: Home / Self Care | Payer: Medicare Other | Attending: Urgent Care | Admitting: Urgent Care

## 2022-03-01 DIAGNOSIS — R350 Frequency of micturition: Secondary | ICD-10-CM | POA: Insufficient documentation

## 2022-03-01 DIAGNOSIS — N3001 Acute cystitis with hematuria: Secondary | ICD-10-CM | POA: Insufficient documentation

## 2022-03-01 DIAGNOSIS — D849 Immunodeficiency, unspecified: Secondary | ICD-10-CM | POA: Insufficient documentation

## 2022-03-01 DIAGNOSIS — N390 Urinary tract infection, site not specified: Secondary | ICD-10-CM | POA: Insufficient documentation

## 2022-03-01 LAB — POCT URINALYSIS DIP (MANUAL ENTRY)
Bilirubin, UA: NEGATIVE
Glucose, UA: NEGATIVE mg/dL
Ketones, POC UA: NEGATIVE mg/dL
Nitrite, UA: POSITIVE — AB
Protein Ur, POC: >=300 mg/dL — AB
Spec Grav, UA: 1.025 (ref 1.010–1.025)
Urobilinogen, UA: 0.2 E.U./dL
pH, UA: 6 (ref 5.0–8.0)

## 2022-03-01 MED ORDER — CEPHALEXIN 500 MG PO CAPS: 500.0000 mg | ORAL_CAPSULE | Freq: Three times a day (TID) | ORAL | 0 refills | Status: DC

## 2022-03-01 MED ORDER — CLOTRIMAZOLE 1 % VA CREA: 1.0000 | TOPICAL_CREAM | Freq: Every day | VAGINAL | 0 refills | Status: DC

## 2022-03-01 NOTE — ED Triage Notes (Signed)
Pt c/o dysuria and freq stared yesterday-NAD-steady gait

## 2022-03-01 NOTE — ED Provider Notes (Addendum)
[facility]  Note:  This document was prepared using Systems analyst and may include unintentional dictation errors.  MRN: [id number] DOB: October 19, 1945  Subjective:   [name] is a 77 y.o. female presenting for 1 day history of recurrent dysuria, urinary frequency and urgency.  Patient has a history of UTIs.  Last episode was a month and a half ago.  Resolved with the use of cephalexin.  Last urine culture showed multiple resistant bacteria.  Patient is immunosuppressed secondary to her renal transplant.  Kidney function is watched very closely by her nephrologist.  Does not have an urologist.  No current facility-administered medications for this encounter.  Current Outpatient Medications:    atorvastatin (LIPITOR) 20 MG tablet, Take 20 mg by mouth daily., Disp: , Rfl:    cephALEXin (KEFLEX) 500 MG capsule, Take 1 capsule (500 mg total) by mouth 2 (two) times daily., Disp: 10 capsule, Rfl: 0   chlorthalidone (HYGROTON) 25 MG tablet, Take 25 mg by mouth daily., Disp: , Rfl:    cloNIDine (CATAPRES) 0.1 MG tablet, Take 0.1 mg by mouth 3 (three) times daily.  , Disp: , Rfl:    estradiol (VIVELLE-DOT) 0.025 MG/24HR, Place 1 patch onto the skin 2 (two) times a week.  , Disp: , Rfl:    fluticasone (FLONASE) 50 MCG/ACT nasal spray, Place 2 sprays into the nose daily., Disp: 16 g, Rfl: 0   Insulin Degludec (TRESIBA FLEXTOUCH [place]), Inject 24 Units into the skin daily., Disp: , Rfl:    magnesium oxide (MAG-OX) 400 MG tablet, Take 400 mg by mouth daily., Disp: , Rfl:    mycophenolate (MYFORTIC) 180 MG EC tablet, Take 180 mg by mouth 2 (two) times daily., Disp: , Rfl:    potassium chloride SA (K-DUR,KLOR-CON) 20 MEQ tablet, Take 20 mEq by mouth 2 (two) times daily., Disp: , Rfl:    PRESCRIPTION MEDICATION, Take 0.5 tablets by mouth daily. Nor syndrome hormonal tablet , Disp: , Rfl:    sevelamer (RENVELA) 800 MG tablet, Take 1,600 mg by mouth 3 (three)  times daily with meals.  , Disp: , Rfl:    tacrolimus (PROGRAF) 1 MG capsule, Take 1 mg by mouth 2 (two) times daily., Disp: , Rfl:    Allergies  Allergen Reactions   Advil [Ibuprofen] Nausea And Vomiting   Aspirin Nausea And Vomiting   Nitrofurantoin Nausea Only   Nitrofurantoin Macrocrystal Nausea Only    Past Medical History:  Diagnosis Date   Hx of tubal ligation t   Hypertension    Renal insufficiency      Past Surgical History:  Procedure Laterality Date   KIDNEY TRANSPLANT     PERITONEAL CATHETER INSERTION     PERITONEAL CATHETER REMOVAL      Family History  Problem Relation Age of Onset   Healthy Mother    Cancer Father     Social History   Tobacco Use   Smoking status: Never   Smokeless tobacco: Never  Vaping Use   Vaping Use: Never used  Substance Use Topics   Alcohol use: No   Drug use: No    ROS   Objective:   Vitals: BP (!) 159/74 (BP Location: Right Arm)   Pulse 82   Temp 97.7 F (36.5 C) (Oral)   Resp 18   SpO2 97%   Physical Exam Constitutional:      General: She is not in acute distress.    Appearance: Normal appearance. She is  well-developed. She is not ill-appearing, toxic-appearing or diaphoretic.  HENT:     Head: Normocephalic and atraumatic.     Nose: Nose normal.     Mouth/Throat:     Mouth: Mucous membranes are moist.     Pharynx: Oropharynx is clear.  Eyes:     General: No scleral icterus.       Right eye: No discharge.        Left eye: No discharge.     Extraocular Movements: Extraocular movements intact.     Conjunctiva/sclera: Conjunctivae normal.  Cardiovascular:     Rate and Rhythm: Normal rate.  Pulmonary:     Effort: Pulmonary effort is normal.  Abdominal:     General: Bowel sounds are normal. There is no distension.     Palpations: Abdomen is soft. There is no mass.     Tenderness: There is no abdominal tenderness. There is no right CVA tenderness, left CVA tenderness, guarding or rebound.  Skin:     General: Skin is warm and dry.  Neurological:     General: No focal deficit present.     Mental Status: She is alert and oriented to person, place, and time.  Psychiatric:        Mood and Affect: Mood normal.        Behavior: Behavior normal.        Thought Content: Thought content normal.        Judgment: Judgment normal.     Results for orders placed or performed during the hospital encounter of 03/01/22 (from the past 24 hour(s))  POCT urinalysis dipstick     Status: Abnormal   Collection Time: 03/01/22  8:39 AM  Result Value Ref Range   Color, UA orange (A) yellow   Clarity, UA clear clear   Glucose, UA negative negative mg/dL   Bilirubin, UA negative negative   Ketones, POC UA negative negative mg/dL   Spec Grav, UA 1.025 1.010 - 1.025   Blood, UA small (A) negative   pH, UA 6.0 5.0 - 8.0   Protein Ur, POC >=300 (A) negative mg/dL   Urobilinogen, UA 0.2 0.2 or 1.0 E.U./dL   Nitrite, UA Positive (A) Negative   Leukocytes, UA Trace (A) Negative   Recent Results (from the past 2160 hour(s))  POCT urinalysis dipstick     Status: Abnormal   Collection Time: 01/01/22 10:07 AM  Result Value Ref Range   Color, UA yellow yellow   Clarity, UA clear clear   Glucose, UA negative negative mg/dL   Bilirubin, UA negative negative   Ketones, POC UA negative negative mg/dL   Spec Grav, UA 1.020 1.010 - 1.025   Blood, UA small (A) negative   pH, UA 6.5 5.0 - 8.0   Protein Ur, POC >=300 (A) negative mg/dL   Urobilinogen, UA 0.2 0.2 or 1.0 E.U./dL   Nitrite, UA Positive (A) Negative   Leukocytes, UA Small (1+) (A) Negative  Urine Culture     Status: Abnormal   Collection Time: 01/01/22 10:14 AM   Specimen: Urine, Clean Catch  Result Value Ref Range   Specimen Description URINE, CLEAN CATCH    Special Requests      NONE Performed at [facility], [address], [place], [place] [address]    Culture >=100,000 COLONIES/mL ESCHERICHIA COLI (A)    Report Status 01/03/2022  FINAL    Organism ID, Bacteria ESCHERICHIA COLI (A)       Susceptibility   Escherichia coli -  MIC*    AMPICILLIN >=32 RESISTANT Resistant     CEFAZOLIN 8 SENSITIVE Sensitive     CEFEPIME <=0.12 SENSITIVE Sensitive     CEFTRIAXONE <=0.25 SENSITIVE Sensitive     CIPROFLOXACIN >=4 RESISTANT Resistant     GENTAMICIN <=1 SENSITIVE Sensitive     IMIPENEM 0.5 SENSITIVE Sensitive     NITROFURANTOIN <=16 SENSITIVE Sensitive     TRIMETH/SULFA >=320 RESISTANT Resistant     AMPICILLIN/SULBACTAM >=32 RESISTANT Resistant     PIP/TAZO <=4 SENSITIVE Sensitive     * >=100,000 COLONIES/mL ESCHERICHIA COLI  POCT urinalysis dipstick     Status: Abnormal   Collection Time: 03/01/22  8:39 AM  Result Value Ref Range   Color, UA orange (A) yellow   Clarity, UA clear clear   Glucose, UA negative negative mg/dL   Bilirubin, UA negative negative   Ketones, POC UA negative negative mg/dL   Spec Grav, UA 1.025 1.010 - 1.025   Blood, UA small (A) negative   pH, UA 6.0 5.0 - 8.0   Protein Ur, POC >=300 (A) negative mg/dL   Urobilinogen, UA 0.2 0.2 or 1.0 E.U./dL   Nitrite, UA Positive (A) Negative   Leukocytes, UA Trace (A) Negative     Assessment and Plan :   PDMP not reviewed this encounter.  1. Acute cystitis with hematuria   2. Urinary frequency   3. Recurrent UTI   4. Immunosuppression ([place])     Suspect primary source of her recurrent UTIs is immunosuppression.  Patient hydrates very well and limits urinary irritants.  Start Keflex to cover for acute cystitis, urine culture pending.  Will use a higher dosing given her renal suppression.  Creatinine clearance calculated at 51 mL/min.  Recommended continued daily hydration and limiting urinary irritants.  She is to follow-up with urology, as well as her kidney specialist.  Counseled patient on potential for adverse effects with medications prescribed/recommended today, ER and return-to-clinic precautions discussed, patient verbalized  understanding.     Jaynee Eagles, [place] 03/01/22 [phone]

## 2022-03-01 NOTE — Discharge Instructions (Signed)
Please start cephalexin to address an urinary tract infection.  You may also use clotrimazole vaginal cream in the event that you develop a yeast infection from the antibiotic use.  Make sure you hydrate very well with plain water and a quantity of 64 ounces of water a day.  Please limit drinks that are considered urinary irritants such as soda, sweet tea, coffee, energy drinks, alcohol.  These can worsen your urinary and genital symptoms but also be the source of them.  I will let you know about your urine culture results through MyChart to see if we need to prescribe or change your antibiotics based off of those results.

## 2022-03-03 LAB — URINE CULTURE: Culture: <10000 — AB

## 2022-03-05 DIAGNOSIS — Z940 Kidney transplant status: Secondary | ICD-10-CM | POA: Diagnosis not present

## 2022-03-05 DIAGNOSIS — E785 Hyperlipidemia, unspecified: Secondary | ICD-10-CM | POA: Diagnosis not present

## 2022-03-05 DIAGNOSIS — E876 Hypokalemia: Secondary | ICD-10-CM | POA: Diagnosis not present

## 2022-03-05 DIAGNOSIS — E119 Type 2 diabetes mellitus without complications: Secondary | ICD-10-CM | POA: Diagnosis not present

## 2022-03-05 DIAGNOSIS — I10 Essential (primary) hypertension: Secondary | ICD-10-CM | POA: Diagnosis not present

## 2022-03-05 DIAGNOSIS — N390 Urinary tract infection, site not specified: Secondary | ICD-10-CM | POA: Diagnosis not present

## 2022-03-22 DIAGNOSIS — E119 Type 2 diabetes mellitus without complications: Secondary | ICD-10-CM | POA: Diagnosis not present

## 2022-03-29 DIAGNOSIS — I10 Essential (primary) hypertension: Secondary | ICD-10-CM | POA: Diagnosis not present

## 2022-03-29 DIAGNOSIS — E119 Type 2 diabetes mellitus without complications: Secondary | ICD-10-CM | POA: Diagnosis not present

## 2022-03-29 DIAGNOSIS — E785 Hyperlipidemia, unspecified: Secondary | ICD-10-CM | POA: Diagnosis not present

## 2022-03-29 DIAGNOSIS — Z940 Kidney transplant status: Secondary | ICD-10-CM | POA: Diagnosis not present

## 2022-03-29 DIAGNOSIS — E1165 Type 2 diabetes mellitus with hyperglycemia: Secondary | ICD-10-CM | POA: Diagnosis not present

## 2022-04-15 DIAGNOSIS — D849 Immunodeficiency, unspecified: Secondary | ICD-10-CM | POA: Diagnosis not present

## 2022-04-15 DIAGNOSIS — N390 Urinary tract infection, site not specified: Secondary | ICD-10-CM | POA: Diagnosis not present

## 2022-04-15 DIAGNOSIS — Z79899 Other long term (current) drug therapy: Secondary | ICD-10-CM | POA: Diagnosis not present

## 2022-04-15 DIAGNOSIS — Z940 Kidney transplant status: Secondary | ICD-10-CM | POA: Diagnosis not present

## 2022-04-19 ENCOUNTER — Encounter (HOSPITAL_COMMUNITY): Payer: Self-pay

## 2022-04-19 ENCOUNTER — Ambulatory Visit (HOSPITAL_COMMUNITY)
Admission: EM | Admit: 2022-04-19 | Discharge: 2022-04-19 | Disposition: A | Discharge status: Home / Self Care | Payer: Medicare Other | Attending: Emergency Medicine | Admitting: Emergency Medicine

## 2022-04-19 DIAGNOSIS — N3001 Acute cystitis with hematuria: Secondary | ICD-10-CM | POA: Diagnosis not present

## 2022-04-19 DIAGNOSIS — N3000 Acute cystitis without hematuria: Secondary | ICD-10-CM

## 2022-04-19 LAB — POCT URINALYSIS DIPSTICK, ED / UC
Bilirubin Urine: NEGATIVE
Glucose, UA: 100 mg/dL — AB
Ketones, ur: NEGATIVE mg/dL
Nitrite: POSITIVE — AB
Protein, ur: >=300 mg/dL — AB
Specific Gravity, Urine: 1.02 (ref 1.005–1.030)
Urobilinogen, UA: 1 mg/dL (ref 0.0–1.0)
pH: 6.5 (ref 5.0–8.0)

## 2022-04-19 MED ORDER — CEPHALEXIN 500 MG PO CAPS: 500.0000 mg | ORAL_CAPSULE | Freq: Three times a day (TID) | ORAL | 0 refills | Status: AC

## 2022-04-19 NOTE — ED Provider Notes (Signed)
[facility]    CSN: [id number] Arrival date & time: 04/19/22  0803      History   Chief Complaint Chief Complaint  Patient presents with   Urinary Tract Infection   Dysuria    HPI [name] is a 77 y.o. female.   Patient presents to clinic with complaints of dysuria, urgency and frequency.  Reports her discomfort with urination started last night and she was up all night going back and forth to the bathroom with urgency and frequency, she was afraid she was going to wet the bed.  To help with her symptoms she did take an Azo last night.  Of note, she is a renal transplant patient and has recurrent UTIs.  She was recently seen by her transplant provider who recommended her to follow-up with urology.  She does have the name and contact information for urologist, has not yet reached out.  She denies any fevers, flank pain, abdominal pain or vomiting.  She reports she did have some nausea this morning.    The history is provided by the patient and medical records.  Urinary Tract Infection Associated symptoms: nausea   Associated symptoms: no abdominal pain, no fever, no flank pain and no vomiting   Dysuria Associated symptoms: nausea   Associated symptoms: no abdominal pain, no fever, no flank pain and no vomiting     Past Medical History:  Diagnosis Date   Hx of tubal ligation t   Hypertension    Renal insufficiency     Patient Active Problem List   Diagnosis Date Noted   Type 2 diabetes mellitus with complication 02/18/2017    Past Surgical History:  Procedure Laterality Date   KIDNEY TRANSPLANT     PERITONEAL CATHETER INSERTION     PERITONEAL CATHETER REMOVAL      OB History   No obstetric history on file.      Home Medications    Prior to Admission medications   Medication Sig Start Date End Date Taking? Authorizing Provider  cephALEXin (KEFLEX) 500 MG capsule Take 1 capsule (500 mg total) by mouth 3 (three) times daily for 10 days.  04/19/22 04/29/22 Yes Rinaldo Ratel, [place] N, FNP  atorvastatin (LIPITOR) 20 MG tablet Take 20 mg by mouth daily.    [provider]  chlorthalidone (HYGROTON) 25 MG tablet Take 25 mg by mouth daily.    [provider]  cloNIDine (CATAPRES) 0.1 MG tablet Take 0.1 mg by mouth 3 (three) times daily.      [provider]  clotrimazole (GYNE-LOTRIMIN) 1 % vaginal cream Place 1 Applicatorful vaginally at bedtime. 03/01/22   Wallis Bamberg, PA-C  estradiol (VIVELLE-DOT) 0.025 MG/24HR Place 1 patch onto the skin 2 (two) times a week.      [provider]  fluticasone (FLONASE) 50 MCG/ACT nasal spray Place 2 sprays into the nose daily. 01/13/11 01/13/12  Fayrene Helper, PA-C  Insulin Degludec (TRESIBA FLEXTOUCH [place]) Inject 24 Units into the skin daily.    [provider]  magnesium oxide (MAG-OX) 400 MG tablet Take 400 mg by mouth daily.    [provider]  mycophenolate (MYFORTIC) 180 MG EC tablet Take 180 mg by mouth 2 (two) times daily.    [provider]  potassium chloride SA (K-DUR,KLOR-CON) 20 MEQ tablet Take 20 mEq by mouth 2 (two) times daily.    [provider]  PRESCRIPTION MEDICATION Take 0.5 tablets by mouth daily. Nor syndrome hormonal tablet  [provider]  sevelamer (RENVELA) 800 MG tablet Take 1,600 mg by mouth 3 (three) times daily with meals.      [provider]  tacrolimus (PROGRAF) 1 MG capsule Take 1 mg by mouth 2 (two) times daily.    [provider]    Family History Family History  Problem Relation Age of Onset   Healthy Mother    Cancer Father     Social History Social History   Tobacco Use   Smoking status: Never   Smokeless tobacco: Never  Vaping Use   Vaping Use: Never used  Substance Use Topics   Alcohol use: No   Drug use: No     Allergies   Advil [ibuprofen], Aspirin, Nitrofurantoin, and Nitrofurantoin macrocrystal   Review of Systems Review of Systems   Constitutional:  Negative for chills, diaphoresis, fatigue and fever.  Gastrointestinal:  Positive for nausea. Negative for abdominal pain, diarrhea and vomiting.  Genitourinary:  Positive for dysuria, frequency and urgency. Negative for decreased urine volume, flank pain and hematuria.  Musculoskeletal:  Negative for back pain.  Neurological:  Negative for syncope.     Physical Exam Triage Vital Signs ED Triage Vitals [04/19/22 0821]  Enc Vitals Group     BP 125/76     Pulse Rate 82     Resp 18     Temp 98.7 F (37.1 C)     Temp Source Oral     SpO2 98 %     Weight      Height      Head Circumference      Peak Flow      Pain Score      Pain Loc      Pain Edu?      Excl. in GC?    No data found.  Updated Vital Signs BP 125/76 (BP Location: Left Arm)   Pulse 82   Temp 98.7 F (37.1 C) (Oral)   Resp 18   SpO2 98%   Visual Acuity Right Eye Distance:   Left Eye Distance:   Bilateral Distance:    Right Eye Near:   Left Eye Near:    Bilateral Near:     Physical Exam Vitals and nursing note reviewed.  Constitutional:      General: She is not in acute distress.    Appearance: Normal appearance. She is well-developed.  HENT:     Head: Normocephalic and atraumatic.     Right Ear: External ear normal.     Left Ear: External ear normal.     Mouth/Throat:     Mouth: Mucous membranes are moist.  Eyes:     Conjunctiva/sclera: Conjunctivae normal.  Cardiovascular:     Rate and Rhythm: Normal rate and regular rhythm.     Heart sounds: Normal heart sounds. No murmur heard. Pulmonary:     Effort: Pulmonary effort is normal. No respiratory distress.     Breath sounds: Normal breath sounds.  Abdominal:     Tenderness: There is no right CVA tenderness or left CVA tenderness.  Musculoskeletal:        General: No swelling. Normal range of motion.     Cervical back: Neck supple.  Skin:    General: Skin is warm and dry.     Capillary Refill: Capillary refill takes  less than 2 seconds.  Neurological:     Mental Status: She is alert and oriented to person, place, and time.  Psychiatric:  Mood and Affect: Mood normal.      UC Treatments / Results  Labs (all labs ordered are listed, but only abnormal results are displayed) Labs Reviewed  POCT URINALYSIS DIPSTICK, ED / UC - Abnormal; Notable for the following components:      Result Value   Glucose, UA 100 (*)    Hgb urine dipstick MODERATE (*)    Protein, ur >=300 (*)    Nitrite POSITIVE (*)    Leukocytes,Ua SMALL (*)    All other components within normal limits  URINE CULTURE    EKG   Radiology No results found.  Procedures Procedures (including critical care time)  Medications Ordered in UC Medications - No data to display  Initial Impression / Assessment and Plan / UC Course  I have reviewed the triage vital signs and the nursing notes.  Pertinent labs & imaging results that were available during my care of the patient were reviewed by me and considered in my medical decision making (see chart for details).  Vitals in triage reviewed, patient is hemodynamically stable.  She is a renal transplant recipient and immunocompromise.  She also took Azo last night, which skews her urinalysis results.  Urinalysis positive for glucose, hemoglobin, protein, nitrates and leukocytes.  Will start treatment for urinary tract infection due to symptoms and history, will start treatment with Keflex.  Will send for culture and call if treatment modification needs to be initiated.  Advised reaching out to urology.  Strict emergency room return precautions given, patient verbalized understanding, no questions at this time.     Final Clinical Impressions(s) / UC Diagnoses   Final diagnoses:  Acute cystitis without hematuria     Discharge Instructions      Please start the antibiotics and take them 3 times daily for the next 10 days.  We have sent your urine off for culture and we will  call if we need to alter your antibiotics or your treatment plan.  Please ensure you are drinking at least 64 ounces of water daily to stay hydrated.  If you develop any pain or discomfort, you can continue to take your Azo as well as alternating between Tylenol and ibuprofen every 4-6 hours.  Please follow-up with a urologist for your recurrent urinary tract infections.  Please return to clinic or seek immediate care if you develop vomiting, fever, flank pain, or no improvement over the next few days.      ED Prescriptions     Medication Sig Dispense Auth. Provider   cephALEXin (KEFLEX) 500 MG capsule Take 1 capsule (500 mg total) by mouth 3 (three) times daily for 10 days. 30 capsule Nylen Creque, [place] N, [place]      PDMP not reviewed this encounter.   Rinaldo RatelGarrison, [place] N, [place]NP 04/19/22 [phone]

## 2022-04-19 NOTE — ED Triage Notes (Signed)
Pt stated Polyuria that started last night. Pt would like to be check for a UTI.

## 2022-04-19 NOTE — Discharge Instructions (Addendum)
Please start the antibiotics and take them 3 times daily for the next 10 days.  We have sent your urine off for culture and we will call if we need to alter your antibiotics or your treatment plan.  Please ensure you are drinking at least 64 ounces of water daily to stay hydrated.  If you develop any pain or discomfort, you can continue to take your Azo as well as alternating between Tylenol and ibuprofen every 4-6 hours.  Please follow-up with a urologist for your recurrent urinary tract infections.  Please return to clinic or seek immediate care if you develop vomiting, fever, flank pain, or no improvement over the next few days.

## 2022-04-20 LAB — URINE CULTURE: Culture: NO GROWTH

## 2022-07-09 DIAGNOSIS — N390 Urinary tract infection, site not specified: Secondary | ICD-10-CM | POA: Diagnosis not present

## 2022-07-09 DIAGNOSIS — I10 Essential (primary) hypertension: Secondary | ICD-10-CM | POA: Diagnosis not present

## 2022-07-09 DIAGNOSIS — E785 Hyperlipidemia, unspecified: Secondary | ICD-10-CM | POA: Diagnosis not present

## 2022-07-09 DIAGNOSIS — N186 End stage renal disease: Secondary | ICD-10-CM | POA: Diagnosis not present

## 2022-07-09 DIAGNOSIS — Z940 Kidney transplant status: Secondary | ICD-10-CM | POA: Diagnosis not present

## 2022-07-09 DIAGNOSIS — E876 Hypokalemia: Secondary | ICD-10-CM | POA: Diagnosis not present

## 2022-07-09 DIAGNOSIS — E119 Type 2 diabetes mellitus without complications: Secondary | ICD-10-CM | POA: Diagnosis not present

## 2022-07-11 DIAGNOSIS — I10 Essential (primary) hypertension: Secondary | ICD-10-CM | POA: Diagnosis not present

## 2022-07-11 DIAGNOSIS — E785 Hyperlipidemia, unspecified: Secondary | ICD-10-CM | POA: Diagnosis not present

## 2022-07-11 DIAGNOSIS — Z940 Kidney transplant status: Secondary | ICD-10-CM | POA: Diagnosis not present

## 2022-07-11 DIAGNOSIS — E1165 Type 2 diabetes mellitus with hyperglycemia: Secondary | ICD-10-CM | POA: Diagnosis not present

## 2022-08-02 ENCOUNTER — Inpatient Hospital Stay (HOSPITAL_COMMUNITY)
Admission: EM | Admit: 2022-08-02 | Discharge: 2022-08-05 | DRG: 375 | Disposition: A | Discharge status: Other Acute Inpatient Hospital | Payer: Medicare Other | Attending: Internal Medicine | Admitting: Internal Medicine

## 2022-08-02 ENCOUNTER — Encounter (HOSPITAL_COMMUNITY): Payer: Self-pay

## 2022-08-02 ENCOUNTER — Emergency Department (HOSPITAL_COMMUNITY): Payer: Medicare Other

## 2022-08-02 ENCOUNTER — Other Ambulatory Visit: Payer: Self-pay

## 2022-08-02 DIAGNOSIS — E876 Hypokalemia: Secondary | ICD-10-CM | POA: Diagnosis not present

## 2022-08-02 DIAGNOSIS — K565 Intestinal adhesions [bands], unspecified as to partial versus complete obstruction: Secondary | ICD-10-CM | POA: Diagnosis not present

## 2022-08-02 DIAGNOSIS — D649 Anemia, unspecified: Secondary | ICD-10-CM | POA: Diagnosis present

## 2022-08-02 DIAGNOSIS — K56609 Unspecified intestinal obstruction, unspecified as to partial versus complete obstruction: Secondary | ICD-10-CM | POA: Diagnosis not present

## 2022-08-02 DIAGNOSIS — R188 Other ascites: Secondary | ICD-10-CM | POA: Diagnosis not present

## 2022-08-02 DIAGNOSIS — K5669 Other partial intestinal obstruction: Secondary | ICD-10-CM | POA: Diagnosis present

## 2022-08-02 DIAGNOSIS — Z87440 Personal history of urinary (tract) infections: Secondary | ICD-10-CM | POA: Diagnosis present

## 2022-08-02 DIAGNOSIS — Z794 Long term (current) use of insulin: Secondary | ICD-10-CM | POA: Diagnosis not present

## 2022-08-02 DIAGNOSIS — Z940 Kidney transplant status: Secondary | ICD-10-CM | POA: Diagnosis not present

## 2022-08-02 DIAGNOSIS — K6389 Other specified diseases of intestine: Secondary | ICD-10-CM

## 2022-08-02 DIAGNOSIS — C180 Malignant neoplasm of cecum: Secondary | ICD-10-CM | POA: Diagnosis present

## 2022-08-02 DIAGNOSIS — I10 Essential (primary) hypertension: Secondary | ICD-10-CM | POA: Diagnosis present

## 2022-08-02 DIAGNOSIS — Z7984 Long term (current) use of oral hypoglycemic drugs: Secondary | ICD-10-CM | POA: Diagnosis not present

## 2022-08-02 DIAGNOSIS — C786 Secondary malignant neoplasm of retroperitoneum and peritoneum: Secondary | ICD-10-CM | POA: Diagnosis not present

## 2022-08-02 DIAGNOSIS — E118 Type 2 diabetes mellitus with unspecified complications: Secondary | ICD-10-CM | POA: Diagnosis not present

## 2022-08-02 DIAGNOSIS — Z881 Allergy status to other antibiotic agents status: Secondary | ICD-10-CM | POA: Diagnosis not present

## 2022-08-02 DIAGNOSIS — R1111 Vomiting without nausea: Secondary | ICD-10-CM | POA: Diagnosis not present

## 2022-08-02 DIAGNOSIS — Z800 Family history of malignant neoplasm of digestive organs: Secondary | ICD-10-CM

## 2022-08-02 DIAGNOSIS — E43 Unspecified severe protein-calorie malnutrition: Secondary | ICD-10-CM | POA: Diagnosis not present

## 2022-08-02 DIAGNOSIS — D84821 Immunodeficiency due to drugs: Secondary | ICD-10-CM

## 2022-08-02 DIAGNOSIS — N2581 Secondary hyperparathyroidism of renal origin: Secondary | ICD-10-CM | POA: Diagnosis not present

## 2022-08-02 DIAGNOSIS — Z8719 Personal history of other diseases of the digestive system: Secondary | ICD-10-CM

## 2022-08-02 DIAGNOSIS — Z9851 Tubal ligation status: Secondary | ICD-10-CM | POA: Diagnosis not present

## 2022-08-02 DIAGNOSIS — Z79621 Long term (current) use of calcineurin inhibitor: Secondary | ICD-10-CM | POA: Diagnosis not present

## 2022-08-02 DIAGNOSIS — R1084 Generalized abdominal pain: Secondary | ICD-10-CM | POA: Diagnosis not present

## 2022-08-02 DIAGNOSIS — R110 Nausea: Secondary | ICD-10-CM | POA: Diagnosis not present

## 2022-08-02 DIAGNOSIS — Z7969 Long term (current) use of other immunomodulators and immunosuppressants: Secondary | ICD-10-CM

## 2022-08-02 DIAGNOSIS — Z886 Allergy status to analgesic agent status: Secondary | ICD-10-CM

## 2022-08-02 DIAGNOSIS — E1122 Type 2 diabetes mellitus with diabetic chronic kidney disease: Secondary | ICD-10-CM | POA: Diagnosis not present

## 2022-08-02 DIAGNOSIS — Z79891 Long term (current) use of opiate analgesic: Secondary | ICD-10-CM

## 2022-08-02 DIAGNOSIS — K8020 Calculus of gallbladder without cholecystitis without obstruction: Secondary | ICD-10-CM | POA: Diagnosis not present

## 2022-08-02 DIAGNOSIS — K56600 Partial intestinal obstruction, unspecified as to cause: Secondary | ICD-10-CM | POA: Diagnosis present

## 2022-08-02 DIAGNOSIS — K828 Other specified diseases of gallbladder: Secondary | ICD-10-CM | POA: Diagnosis not present

## 2022-08-02 DIAGNOSIS — Z888 Allergy status to other drugs, medicaments and biological substances status: Secondary | ICD-10-CM | POA: Diagnosis not present

## 2022-08-02 DIAGNOSIS — I4510 Unspecified right bundle-branch block: Secondary | ICD-10-CM | POA: Diagnosis not present

## 2022-08-02 DIAGNOSIS — D374 Neoplasm of uncertain behavior of colon: Secondary | ICD-10-CM | POA: Diagnosis not present

## 2022-08-02 DIAGNOSIS — R911 Solitary pulmonary nodule: Secondary | ICD-10-CM | POA: Diagnosis present

## 2022-08-02 DIAGNOSIS — E041 Nontoxic single thyroid nodule: Secondary | ICD-10-CM | POA: Diagnosis not present

## 2022-08-02 DIAGNOSIS — C182 Malignant neoplasm of ascending colon: Principal | ICD-10-CM | POA: Diagnosis present

## 2022-08-02 DIAGNOSIS — Z79899 Other long term (current) drug therapy: Secondary | ICD-10-CM

## 2022-08-02 DIAGNOSIS — Z880 Allergy status to penicillin: Secondary | ICD-10-CM

## 2022-08-02 DIAGNOSIS — R160 Hepatomegaly, not elsewhere classified: Secondary | ICD-10-CM | POA: Diagnosis not present

## 2022-08-02 DIAGNOSIS — R933 Abnormal findings on diagnostic imaging of other parts of digestive tract: Secondary | ICD-10-CM | POA: Diagnosis not present

## 2022-08-02 DIAGNOSIS — K5730 Diverticulosis of large intestine without perforation or abscess without bleeding: Secondary | ICD-10-CM | POA: Diagnosis not present

## 2022-08-02 DIAGNOSIS — C779 Secondary and unspecified malignant neoplasm of lymph node, unspecified: Secondary | ICD-10-CM | POA: Diagnosis not present

## 2022-08-02 DIAGNOSIS — Z6822 Body mass index (BMI) 22.0-22.9, adult: Secondary | ICD-10-CM | POA: Diagnosis not present

## 2022-08-02 DIAGNOSIS — N281 Cyst of kidney, acquired: Secondary | ICD-10-CM | POA: Diagnosis not present

## 2022-08-02 DIAGNOSIS — E785 Hyperlipidemia, unspecified: Secondary | ICD-10-CM | POA: Diagnosis present

## 2022-08-02 DIAGNOSIS — Z8619 Personal history of other infectious and parasitic diseases: Secondary | ICD-10-CM | POA: Insufficient documentation

## 2022-08-02 DIAGNOSIS — Z7952 Long term (current) use of systemic steroids: Secondary | ICD-10-CM | POA: Diagnosis not present

## 2022-08-02 DIAGNOSIS — N186 End stage renal disease: Secondary | ICD-10-CM | POA: Diagnosis not present

## 2022-08-02 DIAGNOSIS — K769 Liver disease, unspecified: Secondary | ICD-10-CM | POA: Diagnosis not present

## 2022-08-02 DIAGNOSIS — R1900 Intra-abdominal and pelvic swelling, mass and lump, unspecified site: Secondary | ICD-10-CM | POA: Diagnosis not present

## 2022-08-02 DIAGNOSIS — Z781 Physical restraint status: Secondary | ICD-10-CM | POA: Diagnosis not present

## 2022-08-02 DIAGNOSIS — R109 Unspecified abdominal pain: Secondary | ICD-10-CM | POA: Diagnosis not present

## 2022-08-02 DIAGNOSIS — C772 Secondary and unspecified malignant neoplasm of intra-abdominal lymph nodes: Secondary | ICD-10-CM | POA: Diagnosis not present

## 2022-08-02 DIAGNOSIS — R140 Abdominal distension (gaseous): Secondary | ICD-10-CM | POA: Diagnosis not present

## 2022-08-02 DIAGNOSIS — R739 Hyperglycemia, unspecified: Secondary | ICD-10-CM | POA: Diagnosis not present

## 2022-08-02 DIAGNOSIS — I4891 Unspecified atrial fibrillation: Secondary | ICD-10-CM | POA: Diagnosis not present

## 2022-08-02 DIAGNOSIS — E1165 Type 2 diabetes mellitus with hyperglycemia: Secondary | ICD-10-CM | POA: Diagnosis not present

## 2022-08-02 DIAGNOSIS — Z833 Family history of diabetes mellitus: Secondary | ICD-10-CM | POA: Diagnosis not present

## 2022-08-02 HISTORY — DX: Personal history of urinary (tract) infections: Z87.440

## 2022-08-02 HISTORY — DX: Personal history of other diseases of the digestive system: Z87.19

## 2022-08-02 HISTORY — DX: Type 2 diabetes mellitus without complications: E11.9

## 2022-08-02 LAB — CBC WITH DIFFERENTIAL/PLATELET
Abs Immature Granulocytes: 0.02 10*3/uL (ref 0.00–0.07)
Basophils Absolute: 0 10*3/uL (ref 0.0–0.1)
Basophils Relative: 0 %
Eosinophils Absolute: 0 10*3/uL (ref 0.0–0.5)
Eosinophils Relative: 0 %
HCT: 35.2 % — ABNORMAL LOW (ref 36.0–46.0)
Hemoglobin: 11.4 g/dL — ABNORMAL LOW (ref 12.0–15.0)
Immature Granulocytes: 0 %
Lymphocytes Relative: 9 %
Lymphs Abs: 0.7 10*3/uL (ref 0.7–4.0)
MCH: 28.9 pg (ref 26.0–34.0)
MCHC: 32.4 g/dL (ref 30.0–36.0)
MCV: 89.3 fL (ref 80.0–100.0)
Monocytes Absolute: 0.7 10*3/uL (ref 0.1–1.0)
Monocytes Relative: 8 %
Neutro Abs: 6.7 10*3/uL (ref 1.7–7.7)
Neutrophils Relative %: 83 %
Platelets: 381 10*3/uL (ref 150–400)
RBC: 3.94 MIL/uL (ref 3.87–5.11)
RDW: 13.2 % (ref 11.5–15.5)
WBC: 8.1 10*3/uL (ref 4.0–10.5)
nRBC: 0 % (ref 0.0–0.2)

## 2022-08-02 LAB — COMPREHENSIVE METABOLIC PANEL
ALT: 12 U/L (ref 0–44)
AST: 14 U/L — ABNORMAL LOW (ref 15–41)
Albumin: 3.3 g/dL — ABNORMAL LOW (ref 3.5–5.0)
Alkaline Phosphatase: 45 U/L (ref 38–126)
Anion gap: 9 (ref 5–15)
BUN: 15 mg/dL (ref 8–23)
CO2: 25 mmol/L (ref 22–32)
Calcium: 8.6 mg/dL — ABNORMAL LOW (ref 8.9–10.3)
Chloride: 100 mmol/L (ref 98–111)
Creatinine, Ser: 0.73 mg/dL (ref 0.44–1.00)
GFR, Estimated: >60 mL/min (ref >60)
Glucose, Bld: 229 mg/dL — ABNORMAL HIGH (ref 70–99)
Potassium: 3.2 mmol/L — ABNORMAL LOW (ref 3.5–5.1)
Sodium: 134 mmol/L — ABNORMAL LOW (ref 135–145)
Total Bilirubin: 0.9 mg/dL (ref 0.3–1.2)
Total Protein: 6.6 g/dL (ref 6.5–8.1)

## 2022-08-02 LAB — URINALYSIS, ROUTINE W REFLEX MICROSCOPIC
Bacteria, UA: NONE SEEN
Bilirubin Urine: NEGATIVE
Glucose, UA: >=500 mg/dL — AB
Ketones, ur: 20 mg/dL — AB
Leukocytes,Ua: NEGATIVE
Nitrite: NEGATIVE
Protein, ur: >=300 mg/dL — AB
Specific Gravity, Urine: 1.01 (ref 1.005–1.030)
pH: 6 (ref 5.0–8.0)

## 2022-08-02 LAB — LIPASE, BLOOD: Lipase: 29 U/L (ref 11–51)

## 2022-08-02 LAB — HEMOGLOBIN A1C
Hgb A1c MFr Bld: 8.1 % — ABNORMAL HIGH (ref 4.8–5.6)
Mean Plasma Glucose: 185.77 mg/dL

## 2022-08-02 LAB — GLUCOSE, CAPILLARY
Glucose-Capillary: 204 mg/dL — ABNORMAL HIGH (ref 70–99)
Glucose-Capillary: 183 mg/dL — ABNORMAL HIGH (ref 70–99)

## 2022-08-02 MED ORDER — SODIUM CHLORIDE 0.9 % IV BOLUS
1000.0000 mL | Freq: Once | INTRAVENOUS | Status: AC
  Administered 2022-08-02: 1000 mL via INTRAVENOUS

## 2022-08-02 MED ORDER — SULFAMETHOXAZOLE-TRIMETHOPRIM 400-80 MG PO TABS
1.0000 | ORAL_TABLET | ORAL | Status: DC
  Filled 2022-08-02: qty 1

## 2022-08-02 MED ORDER — POTASSIUM CHLORIDE 10 MEQ/100ML IV SOLN
10.0000 meq | Freq: Once | INTRAVENOUS | Status: AC
  Administered 2022-08-02: 10 meq via INTRAVENOUS

## 2022-08-02 MED ORDER — INSULIN ASPART 100 UNIT/ML IJ SOLN
0.0000 [IU] | INTRAMUSCULAR | Status: DC
  Administered 2022-08-02 – 2022-08-03 (×3): 2 [IU] via SUBCUTANEOUS
  Administered 2022-08-04: 3 [IU] via SUBCUTANEOUS
  Administered 2022-08-04: 2 [IU] via SUBCUTANEOUS
  Administered 2022-08-04: 1 [IU] via SUBCUTANEOUS
  Administered 2022-08-05 (×2): 3 [IU] via SUBCUTANEOUS
  Administered 2022-08-05: 5 [IU] via SUBCUTANEOUS
  Filled 2022-08-02: qty 0.09

## 2022-08-02 MED ORDER — ONDANSETRON HCL 4 MG/2ML IJ SOLN
4.0000 mg | Freq: Four times a day (QID) | INTRAMUSCULAR | Status: DC | PRN
  Administered 2022-08-03 (×2): 4 mg via INTRAVENOUS
  Filled 2022-08-02 (×2): qty 2

## 2022-08-02 MED ORDER — ENOXAPARIN SODIUM 40 MG/0.4ML IJ SOSY
40.0000 mg | PREFILLED_SYRINGE | Freq: Every day | INTRAMUSCULAR | Status: DC
  Administered 2022-08-03 – 2022-08-04 (×2): 40 mg via SUBCUTANEOUS
  Filled 2022-08-02 (×2): qty 0.4

## 2022-08-02 MED ORDER — PREDNISONE 5 MG PO TABS
5.0000 mg | ORAL_TABLET | Freq: Every day | ORAL | Status: DC
  Administered 2022-08-03 – 2022-08-05 (×3): 5 mg via ORAL
  Filled 2022-08-02 (×3): qty 1

## 2022-08-02 MED ORDER — ONDANSETRON HCL 4 MG/2ML IJ SOLN
4.0000 mg | Freq: Once | INTRAMUSCULAR | Status: AC
  Administered 2022-08-02: 4 mg via INTRAVENOUS

## 2022-08-02 MED ORDER — LABETALOL HCL 5 MG/ML IV SOLN: 10.0000 mg | INTRAVENOUS | Status: DC | PRN

## 2022-08-02 MED ORDER — MYCOPHENOLATE SODIUM 180 MG PO TBEC
180.0000 mg | DELAYED_RELEASE_TABLET | Freq: Two times a day (BID) | ORAL | Status: DC
  Administered 2022-08-02 – 2022-08-05 (×6): 180 mg via ORAL
  Filled 2022-08-02 (×7): qty 1

## 2022-08-02 MED ORDER — ACETAMINOPHEN 325 MG PO TABS
650.0000 mg | ORAL_TABLET | Freq: Once | ORAL | Status: AC
  Administered 2022-08-02: 650 mg via ORAL

## 2022-08-02 MED ORDER — SODIUM CHLORIDE 0.9% FLUSH
3.0000 mL | Freq: Two times a day (BID) | INTRAVENOUS | Status: DC
  Administered 2022-08-03 – 2022-08-04 (×3): 3 mL via INTRAVENOUS

## 2022-08-02 MED ORDER — SODIUM CHLORIDE 0.9 % IV SOLN: INTRAVENOUS | Status: DC

## 2022-08-02 MED ORDER — ONDANSETRON HCL 4 MG PO TABS: 4.0000 mg | ORAL_TABLET | Freq: Four times a day (QID) | ORAL | Status: DC | PRN

## 2022-08-02 MED ORDER — HYDRALAZINE HCL 20 MG/ML IJ SOLN: 10.0000 mg | INTRAMUSCULAR | Status: DC | PRN

## 2022-08-02 MED ORDER — TACROLIMUS 1 MG PO CAPS
2.0000 mg | ORAL_CAPSULE | Freq: Every day | ORAL | Status: DC
  Administered 2022-08-02: 2 mg via ORAL
  Filled 2022-08-02: qty 2

## 2022-08-02 MED ORDER — ACETAMINOPHEN 650 MG RE SUPP: 650.0000 mg | Freq: Four times a day (QID) | RECTAL | Status: DC | PRN

## 2022-08-02 MED ORDER — TACROLIMUS 1 MG PO CAPS
3.0000 mg | ORAL_CAPSULE | Freq: Every day | ORAL | Status: DC
  Administered 2022-08-03: 3 mg via ORAL
  Filled 2022-08-02: qty 3

## 2022-08-02 MED ORDER — MORPHINE SULFATE (PF) 2 MG/ML IV SOLN
2.0000 mg | INTRAVENOUS | Status: DC | PRN
  Administered 2022-08-03 – 2022-08-05 (×10): 2 mg via INTRAVENOUS
  Filled 2022-08-02 (×10): qty 1

## 2022-08-02 MED ORDER — ACETAMINOPHEN 325 MG PO TABS
650.0000 mg | ORAL_TABLET | Freq: Four times a day (QID) | ORAL | Status: DC | PRN
  Administered 2022-08-02 – 2022-08-04 (×3): 650 mg via ORAL
  Filled 2022-08-02 (×4): qty 2

## 2022-08-02 MED ORDER — FENTANYL CITRATE PF 50 MCG/ML IJ SOSY
50.0000 ug | PREFILLED_SYRINGE | Freq: Once | INTRAMUSCULAR | Status: AC
  Administered 2022-08-02: 50 ug via INTRAVENOUS

## 2022-08-02 NOTE — Assessment & Plan Note (Signed)
-   mild; baseline Hgb 11-12 g/dL - currently at baseline

## 2022-08-02 NOTE — Plan of Care (Signed)
  Problem: Activity: Goal: Risk for activity intolerance will decrease Outcome: Progressing   Problem: Pain Managment: Goal: General experience of comfort will improve Outcome: Progressing   

## 2022-08-02 NOTE — Assessment & Plan Note (Addendum)
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

## 2022-08-02 NOTE — ED Triage Notes (Signed)
BIBA from home for abd pain since yesterday, n/v, diarrhea. Pt has hx of double kidney transplant. 168/84 BP 100 HR 99% room air

## 2022-08-02 NOTE — Assessment & Plan Note (Signed)
-   presumed due to underlying colonic mass (measures 4.8 x 6.4 cm in RLQ) per CT - GI and general surgery following - NPO per surgery for now - continue IVF, nausea, and pain control - tentative plan for colonoscopy per GI/OR schedule and/or if patient able to tolerate colon prep adequately  - last adequate meal per patient ~7/13. Discussed with surgery; she'll need TPN at this rate; ordered for powerline CVL after discussion with nephrology; no PICC given hx renal transplant and LUE prior fistula

## 2022-08-02 NOTE — Assessment & Plan Note (Signed)
-   hold clonidine for now - PRN Labetalol or hydralazine

## 2022-08-02 NOTE — Assessment & Plan Note (Signed)
-   replete as needed 

## 2022-08-02 NOTE — Hospital Course (Signed)
[name] is a 77 yo female with PMH renal transplant (02/17/11), osteoarthritis, HLD, DM II who presented to the hospital with abdominal pain and nausea/vomiting. She endorsed that symptoms had began approximately 1 week prior to hospitalization but started to dramatically worsen 2 days ago.  She has had unintentional weight loss over the past 1 month approximately 15 pounds.  She is typically cold natured but endorses some chills recently.  She denied any fevers or night sweats.  CT abdomen/pelvis was performed in the ER.  This showed circumferential thickening of the cecum/ascending colon with extension into the terminal ileum concerning for colonic neoplasm.  Dilated distal ileal loops noted suggesting partial SBO. General surgery and GI were consulted.  Patient admitted for further workup. Patient accepted in transfer to [facility] to Dr. Denny Peon.  See below for further A&P

## 2022-08-02 NOTE — Assessment & Plan Note (Signed)
-   hold basal insulin for now - q4h SSI - follow up A1c

## 2022-08-02 NOTE — Consult Note (Signed)
[name] 05-01-1945  [id number].    Requesting MD: Lockie Mola, MD Chief Complaint/Reason for Consult: colon mass  HPI:  [name] is a 77 y/o F with PMH diabetes, recurrent UTI, and kidney transplant 2013 at [facility] who presents to the hospital with cc abdominal pain. She reports two days of right sided/upper abdomina pain, worse when she is sitting up. She states that she feels like the food she eats is backing up. Associated sxs include poor PO intake, nausea, and vomiting. She also reports increased frequency of stools the last 24-48h. At baseline she has one formed, non-bloody stool in the morning after breakfast but she tells me that she has had 2-3 loose stools in the last 24 hours. She reports occasional scant blood on the toilet paper but denies maroon stools, black stools, or bright red blood in the toilet after BMs. The patient reports fatigue and decreased energry levels for about one month. She reports unintentional weight loss for over one month, saying she has lost over 10 lbs. She denies tobacco, alcohol, or drug use. She denies a history of abdominal surgery other than her transplant. She tells me that she follows up at [facility] annually with the transplant service. Her last colonoscopy was 12 years ago and was negative for malignancy. At baseline she lives at home with her husband.   ROS: Review of Systems  All other systems reviewed and are negative.   Family History  Problem Relation Age of Onset   Healthy Mother    Cancer Father     Past Medical History:  Diagnosis Date   Diabetes mellitus type 2, controlled (HCC)    History of diverticulitis of colon 2016 & 2018 08/02/2022   History of kidney transplant 2011-03-12   [facility] -  ECD deceased donor kidney transplant on 03-12-11   History of recurrent UTIs 08/02/2022   Hypertension    Renal insufficiency     Past Surgical History:  Procedure Laterality Date   KIDNEY TRANSPLANT   03-12-2011   ECD deceased donor kidney transplant at [facility]   PERITONEAL CATHETER INSERTION  06/20/2010   PERITONEAL CATHETER REMOVAL  08/09/2010    Social History:  reports that she has never smoked. She has never used smokeless tobacco. She reports that she does not drink alcohol and does not use drugs.  Allergies:  Allergies  Allergen Reactions   Advil [Ibuprofen] Nausea And Vomiting   Amoxicillin-Pot Clavulanate Nausea And Vomiting   Aspirin Nausea And Vomiting   Ciprofloxacin     Other Reaction(s): dizziness   Nitrofurantoin Nausea Only   Nitrofurantoin Macrocrystal Nausea Only   Simvastatin     Other Reaction(s): mucsle aches    (Not in a hospital admission)    Physical Exam: Blood pressure (!) 160/85, pulse 88, temperature 98.2 F (36.8 C), temperature source Oral, resp. rate 17, height 4\' 11"  (1.499 m), weight 47.6 kg, SpO2 100%. General: Pleasant female, laying in hospital bed, no acute distress HEENT: head -normocephalic, atraumatic; Eyes: PERRLA, no conjunctival injection, anicteric sclerae Neck- Trachea is midline CV- RRR, normal S1/S2, no M/R/G, no lower extremity edema  Pulm- breathing is non-labored ORA Abd- soft, there is mild tenderness in the right hemi-abdomen without guarding, there is a palpable mass in the RUQ, no hernias, no peritonitis. GU- deferred  MSK- UE/LE symmetrical, no cyanosis, clubbing, or edema. Neuro- CN II-XII grossly in tact, no paresthesias. Psych- Alert and Oriented x3 with appropriate affect Skin: warm and  dry, no rashes or lesions   Results for orders placed or performed during the hospital encounter of 08/02/22 (from the past 48 hour(s))  CBC with Differential     Status: Abnormal   Collection Time: 08/02/22 11:37 AM  Result Value Ref Range   WBC 8.1 4.0 - 10.5 K/uL   RBC 3.94 3.87 - 5.11 MIL/uL   Hemoglobin 11.4 (L) 12.0 - 15.0 g/dL   HCT 16.1 (L) 09.6 - 04.5 %   MCV 89.3 80.0 - 100.0 fL   MCH 28.9 26.0 - 34.0  pg   MCHC 32.4 30.0 - 36.0 g/dL   RDW 40.9 81.1 - 91.4 %   Platelets 381 150 - 400 K/uL   nRBC 0.0 0.0 - 0.2 %   Neutrophils Relative % 83 %   Neutro Abs 6.7 1.7 - 7.7 K/uL   Lymphocytes Relative 9 %   Lymphs Abs 0.7 0.7 - 4.0 K/uL   Monocytes Relative 8 %   Monocytes Absolute 0.7 0.1 - 1.0 K/uL   Eosinophils Relative 0 %   Eosinophils Absolute 0.0 0.0 - 0.5 K/uL   Basophils Relative 0 %   Basophils Absolute 0.0 0.0 - 0.1 K/uL   Immature Granulocytes 0 %   Abs Immature Granulocytes 0.02 0.00 - 0.07 K/uL    Comment: Performed at [facility], [address], [place], [place] [address]  Comprehensive metabolic panel     Status: Abnormal   Collection Time: 08/02/22 11:37 AM  Result Value Ref Range   Sodium 134 (L) 135 - 145 mmol/L   Potassium 3.2 (L) 3.5 - 5.1 mmol/L   Chloride 100 98 - 111 mmol/L   CO2 25 22 - 32 mmol/L   Glucose, Bld 229 (H) 70 - 99 mg/dL    Comment: Glucose reference range applies only to samples taken after fasting for at least 8 hours.   BUN 15 8 - 23 mg/dL   Creatinine, Ser 6.21 0.44 - 1.00 mg/dL   Calcium 8.6 (L) 8.9 - 10.3 mg/dL   Total Protein 6.6 6.5 - 8.1 g/dL   Albumin 3.3 (L) 3.5 - 5.0 g/dL   AST 14 (L) 15 - 41 U/L   ALT 12 0 - 44 U/L   Alkaline Phosphatase 45 38 - 126 U/L   Total Bilirubin 0.9 0.3 - 1.2 mg/dL   GFR, Estimated >30 >86 mL/min    Comment: (NOTE) Calculated using the CKD-EPI Creatinine Equation (2021)    Anion gap 9 5 - 15    Comment: Performed at [facility], [address] [place], [place] [address]  Lipase, blood     Status: None   Collection Time: 08/02/22 11:37 AM  Result Value Ref Range   Lipase 29 11 - 51 U/L    Comment: Performed at [facility], [address], [place], [place] [address]   CT ABDOMEN PELVIS WO CONTRAST  Result Date: 08/02/2022 CLINICAL DATA:  Abdominal pain, acute, nonlocalized EXAM: CT ABDOMEN AND PELVIS WITHOUT CONTRAST TECHNIQUE:  Multidetector CT imaging of the abdomen and pelvis was performed following the standard protocol without IV contrast. RADIATION DOSE REDUCTION: This exam was performed according to the departmental dose-optimization program which includes automated exposure control, adjustment of the mA and/or kV according to patient size and/or use of iterative reconstruction technique. COMPARISON:  CT scan abdomen and pelvis from 08/07/2010. FINDINGS: Lower chest: There are patchy atelectatic changes in the visualized lung bases. No overt consolidation. No pleural effusion. The  heart is normal in size. No pericardial effusion. Hepatobiliary: The liver is normal in size. Non-cirrhotic configuration. No suspicious intrahepatic mass. However, there are multiple new heterogeneous hypoattenuating lesions along the liver capsule as well as adjacent peritoneal reflections, favored to represent tumor implants. Few of the lesions exhibit mild liver surface scalloping. No intrahepatic or extrahepatic bile duct dilation. Contracted gallbladder containing several noncalcified gallstones. Normal gallbladder wall thickness. No pericholecystic inflammatory changes. Pancreas: Unremarkable. No pancreatic ductal dilatation or surrounding inflammatory changes. Spleen: Within normal limits. No focal lesion. Adrenals/Urinary Tract: Adrenal glands are unremarkable. Small/atrophic bilateral native kidneys. There is a transplanted kidney in the right lower abdomen. No hydronephrosis. There is a subcentimeter sized hyperattenuating focus in the upper pole of the transplanted kidney, indeterminate but favored to represent a proteinaceous/hemorrhagic cyst. No hydronephrosis. No renal or ureteric calculi. Unremarkable urinary bladder. Stomach/Bowel: There is a probable small sliding hiatal hernia. There is marked circumferential thickening of the cecum/ascending colon measuring up to 2.1 cm, with extension into the terminal ileum. There is resultant dilation  of distal ileal loops measuring up to 3.8 cm in diameter. The remaining distal colon is nondilated. There are multiple diverticula in the sigmoid colon without diverticulitis. Stomach and proximal small bowel loops are also nondilated. Vascular/Lymphatic: There is mild ascites mainly in the dependent pelvis and in the perihepatic/perisplenic region. There are multiple heterogeneous nodules throughout the upper abdomen, favoring peritoneal carcinomatosis. One such largest conglomerated mass in the right lower quadrant measures up to 4.8 x 6.4 cm on coronal plane (series 4, image 43). No aneurysmal dilation of the major abdominal arteries. There are moderate peripheral atherosclerotic vascular calcifications of the aorta and its major branches. Reproductive: The uterus is unremarkable. No large adnexal mass. Other: The visualized soft tissues and abdominal wall are unremarkable. Musculoskeletal: No suspicious osseous lesions. IMPRESSION: 1. Marked circumferential thickening of the cecum/ascending colon with extension into the terminal ileum, consistent with a primary colonic neoplasm. There is resultant dilation of distal ileal loops measuring up to 3.8 cm in diameter, suggesting partial small bowel obstruction. The remaining distal colon is nondilated. 2. Multiple heterogeneous nodules throughout the abdomen, favoring peritoneal carcinomatosis. 3. Multiple other nonacute observations, as described above. Aortic Atherosclerosis (ICD10-I70.0). Electronically Signed   By: Jules Schick M.D.   On: 08/02/2022 14:06      Assessment/Plan Cecal mass with concern for peritoneal carcinomatosis pSBO due to above  Pleasant 77 y/o F with remote history of kidney transplant on tacrolimus who presents with abdominal pain, weight loss, and signs of pSBO (nausea, bloating, decreased PO intake, loose stools). She does not have significant gastric distention on her CT scan and her nausea is currently controlled, I do not seen  a need for NG tube decompression at the moment. No emergent role for surgery. Recommend GI consult for colonoscopy/biopsy. [facility] will follow along and continue to make recommendations regarding pSBO and possible future need for surgery. Ok for PO meds and ice chips.    I reviewed nursing notes, ED provider notes, last 24 h vitals and pain scores, last 48 h intake and output, last 24 h labs and trends, and last 24 h imaging results.  Adam Phenix, PA-C [facility] 08/02/2022, 3:13 PM Please see Amion for pager number during day hours 7:00am-4:30pm or 7:00am -11:30am on weekends

## 2022-08-02 NOTE — ED Provider Notes (Signed)
[place] EMERGENCY DEPARTMENT AT [facility] Provider Note   CSN: [id number] Arrival date & time: 08/02/22  1105     History  Chief Complaint  Patient presents with   Abdominal Pain   Emesis    [name] is a 77 y.o. female.  Pt here with nausea vomiting and diarrhea. Pt with renal transplant 9 yrs ago. Patient with emesis yesterday and abdominal pain and now diarrhea also this morning. Normal vitals with ems. Denies pain with urination, abdominal pain all over. Denies black or bloody stools/vomiting. Patient denies chest pain, sob, weakness, numbness.  The history is provided by the patient.       Home Medications Prior to Admission medications   Medication Sig Start Date End Date Taking? Authorizing Provider  atorvastatin (LIPITOR) 20 MG tablet Take 20 mg by mouth daily.    [provider]  chlorthalidone (HYGROTON) 25 MG tablet Take 25 mg by mouth daily.    [provider]  cloNIDine (CATAPRES) 0.1 MG tablet Take 0.1 mg by mouth 3 (three) times daily.      [provider]  clotrimazole (GYNE-LOTRIMIN) 1 % vaginal cream Place 1 Applicatorful vaginally at bedtime. 03/01/22   Wallis Bamberg, PA-C  estradiol (VIVELLE-DOT) 0.025 MG/24HR Place 1 patch onto the skin 2 (two) times a week.      [provider]  fluticasone (FLONASE) 50 MCG/ACT nasal spray Place 2 sprays into the nose daily. 01/13/11 01/13/12  Fayrene Helper, PA-C  Insulin Degludec (TRESIBA FLEXTOUCH [place]) Inject 24 Units into the skin daily.    [provider]  magnesium oxide (MAG-OX) 400 MG tablet Take 400 mg by mouth daily.    [provider]  mycophenolate (MYFORTIC) 180 MG EC tablet Take 180 mg by mouth 2 (two) times daily.    [provider]  potassium chloride SA (K-DUR,KLOR-CON) 20 MEQ tablet Take 20 mEq by mouth 2 (two) times daily.    [provider]  PRESCRIPTION MEDICATION Take 0.5 tablets by mouth daily. Nor syndrome  hormonal tablet     [provider]  sevelamer (RENVELA) 800 MG tablet Take 1,600 mg by mouth 3 (three) times daily with meals.      [provider]  tacrolimus (PROGRAF) 1 MG capsule Take 1 mg by mouth 2 (two) times daily.    [provider]      Allergies    Advil [ibuprofen], Amoxicillin-pot clavulanate, Aspirin, Ciprofloxacin, Nitrofurantoin, Nitrofurantoin macrocrystal, and Simvastatin    Review of Systems   Review of Systems  Physical Exam Updated Vital Signs BP (!) 160/85   Pulse 88   Temp 98.5 F (36.9 C) (Oral)   Resp 17   Ht 4\' 11"  (1.499 m)   Wt 47.6 kg   SpO2 100%   BMI 21.21 kg/m  Physical Exam Vitals and nursing note reviewed.  Constitutional:      General: She is not in acute distress.    Appearance: She is well-developed.  HENT:     Head: Normocephalic and atraumatic.  Eyes:     Extraocular Movements: Extraocular movements intact.     Conjunctiva/sclera: Conjunctivae normal.     Pupils: Pupils are equal, round, and reactive to light.  Cardiovascular:     Rate and Rhythm: Normal rate and regular rhythm.     Heart sounds: Normal heart sounds. No murmur heard. Pulmonary:     Effort: Pulmonary effort is normal. No respiratory distress.     Breath sounds: Normal breath  sounds.  Abdominal:     Palpations: Abdomen is soft.     Tenderness: There is generalized abdominal tenderness.  Musculoskeletal:        General: No swelling.     Cervical back: Neck supple.  Skin:    General: Skin is warm and dry.     Capillary Refill: Capillary refill takes less than 2 seconds.  Neurological:     Mental Status: She is alert.  Psychiatric:        Mood and Affect: Mood normal.     ED Results / Procedures / Treatments   Labs (all labs ordered are listed, but only abnormal results are displayed) Labs Reviewed  CBC WITH DIFFERENTIAL/PLATELET - Abnormal; Notable for the following components:      Result Value   Hemoglobin 11.4 (*)    HCT  35.2 (*)    All other components within normal limits  COMPREHENSIVE METABOLIC PANEL - Abnormal; Notable for the following components:   Sodium 134 (*)    Potassium 3.2 (*)    Glucose, Bld 229 (*)    Calcium 8.6 (*)    Albumin 3.3 (*)    AST 14 (*)    All other components within normal limits  LIPASE, BLOOD  URINALYSIS, ROUTINE W REFLEX MICROSCOPIC    EKG None  Radiology CT ABDOMEN PELVIS WO CONTRAST  Result Date: 08/02/2022 CLINICAL DATA:  Abdominal pain, acute, nonlocalized EXAM: CT ABDOMEN AND PELVIS WITHOUT CONTRAST TECHNIQUE: Multidetector CT imaging of the abdomen and pelvis was performed following the standard protocol without IV contrast. RADIATION DOSE REDUCTION: This exam was performed according to the departmental dose-optimization program which includes automated exposure control, adjustment of the mA and/or kV according to patient size and/or use of iterative reconstruction technique. COMPARISON:  CT scan abdomen and pelvis from 08/07/2010. FINDINGS: Lower chest: There are patchy atelectatic changes in the visualized lung bases. No overt consolidation. No pleural effusion. The heart is normal in size. No pericardial effusion. Hepatobiliary: The liver is normal in size. Non-cirrhotic configuration. No suspicious intrahepatic mass. However, there are multiple new heterogeneous hypoattenuating lesions along the liver capsule as well as adjacent peritoneal reflections, favored to represent tumor implants. Few of the lesions exhibit mild liver surface scalloping. No intrahepatic or extrahepatic bile duct dilation. Contracted gallbladder containing several noncalcified gallstones. Normal gallbladder wall thickness. No pericholecystic inflammatory changes. Pancreas: Unremarkable. No pancreatic ductal dilatation or surrounding inflammatory changes. Spleen: Within normal limits. No focal lesion. Adrenals/Urinary Tract: Adrenal glands are unremarkable. Small/atrophic bilateral native kidneys.  There is a transplanted kidney in the right lower abdomen. No hydronephrosis. There is a subcentimeter sized hyperattenuating focus in the upper pole of the transplanted kidney, indeterminate but favored to represent a proteinaceous/hemorrhagic cyst. No hydronephrosis. No renal or ureteric calculi. Unremarkable urinary bladder. Stomach/Bowel: There is a probable small sliding hiatal hernia. There is marked circumferential thickening of the cecum/ascending colon measuring up to 2.1 cm, with extension into the terminal ileum. There is resultant dilation of distal ileal loops measuring up to 3.8 cm in diameter. The remaining distal colon is nondilated. There are multiple diverticula in the sigmoid colon without diverticulitis. Stomach and proximal small bowel loops are also nondilated. Vascular/Lymphatic: There is mild ascites mainly in the dependent pelvis and in the perihepatic/perisplenic region. There are multiple heterogeneous nodules throughout the upper abdomen, favoring peritoneal carcinomatosis. One such largest conglomerated mass in the right lower quadrant measures up to 4.8 x 6.4 cm on coronal plane (series 4, image 43). No aneurysmal  dilation of the major abdominal arteries. There are moderate peripheral atherosclerotic vascular calcifications of the aorta and its major branches. Reproductive: The uterus is unremarkable. No large adnexal mass. Other: The visualized soft tissues and abdominal wall are unremarkable. Musculoskeletal: No suspicious osseous lesions. IMPRESSION: 1. Marked circumferential thickening of the cecum/ascending colon with extension into the terminal ileum, consistent with a primary colonic neoplasm. There is resultant dilation of distal ileal loops measuring up to 3.8 cm in diameter, suggesting partial small bowel obstruction. The remaining distal colon is nondilated. 2. Multiple heterogeneous nodules throughout the abdomen, favoring peritoneal carcinomatosis. 3. Multiple other  nonacute observations, as described above. Aortic Atherosclerosis (ICD10-I70.0). Electronically Signed   By: Jules Schick M.D.   On: 08/02/2022 14:06    Procedures Procedures    Medications Ordered in ED Medications  potassium chloride 10 mEq in 100 mL IVPB (10 mEq Intravenous New Bag/Given 08/02/22 1350)  sodium chloride 0.9 % bolus 1,000 mL (0 mLs Intravenous Stopped 08/02/22 1331)  ondansetron (ZOFRAN) injection 4 mg (4 mg Intravenous Given 08/02/22 1206)  fentaNYL (SUBLIMAZE) injection 50 mcg (50 mcg Intravenous Given 08/02/22 1206)  acetaminophen (TYLENOL) tablet 650 mg (650 mg Oral Given 08/02/22 1432)    ED Course/ Medical Decision Making/ A&P                             Medical Decision Making Amount and/or Complexity of Data Reviewed Labs: ordered. Radiology: ordered.  Risk OTC drugs. Prescription drug management.   [name] is here with abdominal pain, nausea, vomting, diarrhea. Patient with unremarkable vitals. Hx of renal transplant 9 yrs ago.DDX colitis v food born illness v viral process v less likely obstructive process. No cardiac or pulmonary symptoms. No urinary symtpoms. Will get cbc, cmp, lipase, urinalysis, CT ab pelvis. Will give iv fluids, fentanyl, zofran.  Labs per my review and interpretation unremarkable except for K of 3.2, repleted. CT per radiology report shows suspected colon mass with partial obstruction. Talked with Bailey Mech with general surgery who will eval and follow, talked with Dr. Lorenso Quarry with [facility]. GI to eval tomorrow, okay for clears. Pt feeling better will hold on NG tube. Will admit to medicine for further care.  This chart was dictated using voice recognition software.  Despite best efforts to proofread,  errors can occur which can change the documentation meaning.         Final Clinical Impression(s) / ED Diagnoses Final diagnoses:  Other partial intestinal obstruction [facility])  Colonic mass    Rx / DC Orders ED  Discharge Orders     None         Virgina Norfolk, DO 08/02/22 1447

## 2022-08-02 NOTE — Assessment & Plan Note (Addendum)
-   transplanted in 2013; [facility] - continue prograf (patient may use home supply) and mycophenolate - continue prednisone - continue MWF Bactrim for ppx

## 2022-08-02 NOTE — H&P (Addendum)
History and Physical    [name]  ZOX:[id number]  DOB: 01/04/1946  DOA: 08/02/2022  PCP: Terri Palmer, MD Patient coming from: Home  Chief Complaint: abdominal pain, N/V  HPI:  [name] is a 77 yo female with PMH renal transplant (2011/03/15), osteoarthritis, HLD, DM II who presented to the hospital with abdominal pain and nausea/vomiting. She endorsed that symptoms had began approximately 1 week prior to hospitalization but started to dramatically worsen 2 days ago.  She has had unintentional weight loss over the past 1 month approximately 15 pounds.  She is typically cold natured but endorses some chills recently.  She denied any fevers or night sweats.  CT abdomen/pelvis was performed in the ER.  This showed circumferential thickening of the cecum/ascending colon with extension into the terminal ileum concerning for colonic neoplasm.  Dilated distal ileal loops noted suggesting partial SBO. General surgery and GI were consulted.  Patient admitted for further workup.  I have personally briefly reviewed patient's old medical records in [facility] and discussed patient with the ER provider when appropriate/indicated.  Assessment and Plan: * Partial small bowel obstruction (HCC) - presumed due to underlying colonic mass (measures 4.8 x 6.4 cm in RLQ) per CT - GI and general surgery following - NPO per surgery for now - continue IVF, nausea, and pain control  Ascending colonic mass - per CT: "largest conglomerated mass in the right lower quadrant measures up to 4.8 x 6.4 cm" - "Marked circumferential thickening of the cecum/ascending colon with extension into the terminal ileum, consistent with a primary colonic neoplasm. There is resultant dilation of distal ileal loops measuring up to 3.8 cm in diameter, suggesting partial small bowel obstruction. The remaining distal colon is nondilated." - suspect will need colonoscopy for biopsy vs resection; will defer to GI/General  surgery   History of kidney transplant - RLQ - 2013 - transplanted in 2013; DDKT - continue prograf and mycophenolate - continue prednisone - continue MWF Bactrim for ppx  Normocytic anemia - mild; baseline Hgb 11-12 g/dL - currently at baseline   Hypokalemia - replete as needed  Essential hypertension - hold clonidine for now - PRN Labetalol or hydralazine   Type 2 diabetes mellitus with complication (HCC) - hold basal insulin for now - q4h SSI - follow up A1c   Code Status:     Code Status: Full Code  DVT Prophylaxis: Lovenox     Anticipated disposition is to: Home  History: Past Medical History:  Diagnosis Date   Diabetes mellitus type 2, controlled (HCC)    History of diverticulitis of colon 2016 & 2018 08/02/2022   History of kidney transplant 03/15/11   [facility] -  ECD deceased donor kidney transplant on 03-15-11   History of recurrent UTIs 08/02/2022   Hypertension    Renal insufficiency     Past Surgical History:  Procedure Laterality Date   KIDNEY TRANSPLANT  March 15, 2011   ECD deceased donor kidney transplant at [facility]   PERITONEAL CATHETER INSERTION  06/20/2010   PERITONEAL CATHETER REMOVAL  08/09/2010     reports that she has never smoked. She has never used smokeless tobacco. She reports that she does not drink alcohol and does not use drugs.  Allergies  Allergen Reactions   Advil [Ibuprofen] Nausea And Vomiting   Amoxicillin-Pot Clavulanate Nausea And Vomiting   Aspirin Nausea And Vomiting   Ciprofloxacin     Other Reaction(s): dizziness   Nitrofurantoin Nausea Only   Nitrofurantoin Macrocrystal Nausea  Only   Simvastatin     Other Reaction(s): mucsle aches    Family History  Problem Relation Age of Onset   Healthy Mother    Cancer Father    Home Medications: Prior to Admission medications   Medication Sig Start Date End Date Taking? Authorizing Provider  atorvastatin (LIPITOR) 20 MG tablet Take 20 mg by mouth daily.     [provider]  chlorthalidone (HYGROTON) 25 MG tablet Take 25 mg by mouth daily.    [provider]  cloNIDine (CATAPRES) 0.1 MG tablet Take 0.1 mg by mouth 3 (three) times daily.      [provider]  clotrimazole (GYNE-LOTRIMIN) 1 % vaginal cream Place 1 Applicatorful vaginally at bedtime. 03/01/22   Wallis Bamberg, PA-C  estradiol (VIVELLE-DOT) 0.025 MG/24HR Place 1 patch onto the skin 2 (two) times a week.      [provider]  fluticasone (FLONASE) 50 MCG/ACT nasal spray Place 2 sprays into the nose daily. 01/13/11 01/13/12  Fayrene Helper, PA-C  Insulin Degludec (TRESIBA FLEXTOUCH [place]) Inject 24 Units into the skin daily.    [provider]  magnesium oxide (MAG-OX) 400 MG tablet Take 400 mg by mouth daily.    [provider]  mycophenolate (MYFORTIC) 180 MG EC tablet Take 180 mg by mouth 2 (two) times daily.    [provider]  potassium chloride SA (K-DUR,KLOR-CON) 20 MEQ tablet Take 20 mEq by mouth 2 (two) times daily.    [provider]  PRESCRIPTION MEDICATION Take 0.5 tablets by mouth daily. Nor syndrome hormonal tablet     [provider]  sevelamer (RENVELA) 800 MG tablet Take 1,600 mg by mouth 3 (three) times daily with meals.      [provider]  tacrolimus (PROGRAF) 1 MG capsule Take 1 mg by mouth 2 (two) times daily.    [provider]    Review of Systems:  Review of Systems  Constitutional:  Positive for chills and weight loss.  HENT: Negative.    Eyes: Negative.   Respiratory: Negative.    Cardiovascular: Negative.   Gastrointestinal:  Positive for abdominal pain, diarrhea, nausea and vomiting.  Genitourinary: Negative.   Musculoskeletal: Negative.   Skin: Negative.   Neurological: Negative.   Endo/Heme/Allergies: Negative.   Psychiatric/Behavioral: Negative.      Physical Exam:  Vitals:   08/02/22 1430 08/02/22 1458 08/02/22 1500 08/02/22 1530  BP: (!) 160/85  (!)  149/89 (!) 142/80  Pulse: 88  83 71  Resp: 17  17 17   Temp:  98.2 F (36.8 C)    TempSrc:  Oral    SpO2: 100%  100% 99%  Weight:      Height:       Physical Exam Constitutional:      General: She is not in acute distress.    Appearance: She is well-developed. She is not ill-appearing.  HENT:     Head: Normocephalic and atraumatic.     Mouth/Throat:     Mouth: Mucous membranes are moist.  Eyes:     Extraocular Movements: Extraocular movements intact.  Cardiovascular:     Rate and Rhythm: Normal rate and regular rhythm.  Pulmonary:     Effort: Pulmonary effort is normal. No respiratory distress.     Breath sounds: Normal breath sounds. No wheezing.  Abdominal:     General: Bowel sounds are normal. There is no distension.     Palpations: Abdomen is soft.     Tenderness: There  is abdominal tenderness (mild TTP nonspecific).  Musculoskeletal:        General: Normal range of motion.     Cervical back: Normal range of motion and neck supple.  Skin:    General: Skin is warm and dry.  Neurological:     General: No focal deficit present.     Mental Status: She is alert.  Psychiatric:        Mood and Affect: Mood normal.        Behavior: Behavior normal.      Labs on Admission:  I have personally reviewed following labs and imaging studies Results for orders placed or performed during the hospital encounter of 08/02/22 (from the past 24 hour(s))  CBC with Differential     Status: Abnormal   Collection Time: 08/02/22 11:37 AM  Result Value Ref Range   WBC 8.1 4.0 - 10.5 K/uL   RBC 3.94 3.87 - 5.11 MIL/uL   Hemoglobin 11.4 (L) 12.0 - 15.0 g/dL   HCT 02.7 (L) 25.3 - 66.4 %   MCV 89.3 80.0 - 100.0 fL   MCH 28.9 26.0 - 34.0 pg   MCHC 32.4 30.0 - 36.0 g/dL   RDW 40.3 47.4 - 25.9 %   Platelets 381 150 - 400 K/uL   nRBC 0.0 0.0 - 0.2 %   Neutrophils Relative % 83 %   Neutro Abs 6.7 1.7 - 7.7 K/uL   Lymphocytes Relative 9 %   Lymphs Abs 0.7 0.7 - 4.0 K/uL   Monocytes  Relative 8 %   Monocytes Absolute 0.7 0.1 - 1.0 K/uL   Eosinophils Relative 0 %   Eosinophils Absolute 0.0 0.0 - 0.5 K/uL   Basophils Relative 0 %   Basophils Absolute 0.0 0.0 - 0.1 K/uL   Immature Granulocytes 0 %   Abs Immature Granulocytes 0.02 0.00 - 0.07 K/uL  Comprehensive metabolic panel     Status: Abnormal   Collection Time: 08/02/22 11:37 AM  Result Value Ref Range   Sodium 134 (L) 135 - 145 mmol/L   Potassium 3.2 (L) 3.5 - 5.1 mmol/L   Chloride 100 98 - 111 mmol/L   CO2 25 22 - 32 mmol/L   Glucose, Bld 229 (H) 70 - 99 mg/dL   BUN 15 8 - 23 mg/dL   Creatinine, Ser 5.63 0.44 - 1.00 mg/dL   Calcium 8.6 (L) 8.9 - 10.3 mg/dL   Total Protein 6.6 6.5 - 8.1 g/dL   Albumin 3.3 (L) 3.5 - 5.0 g/dL   AST 14 (L) 15 - 41 U/L   ALT 12 0 - 44 U/L   Alkaline Phosphatase 45 38 - 126 U/L   Total Bilirubin 0.9 0.3 - 1.2 mg/dL   GFR, Estimated >87 >56 mL/min   Anion gap 9 5 - 15  Lipase, blood     Status: None   Collection Time: 08/02/22 11:37 AM  Result Value Ref Range   Lipase 29 11 - 51 U/L     Radiological Exams on Admission: CT ABDOMEN PELVIS WO CONTRAST  Result Date: 08/02/2022 CLINICAL DATA:  Abdominal pain, acute, nonlocalized EXAM: CT ABDOMEN AND PELVIS WITHOUT CONTRAST TECHNIQUE: Multidetector CT imaging of the abdomen and pelvis was performed following the standard protocol without IV contrast. RADIATION DOSE REDUCTION: This exam was performed according to the departmental dose-optimization program which includes automated exposure control, adjustment of the mA and/or kV according to patient size and/or use of iterative reconstruction technique. COMPARISON:  CT scan abdomen and pelvis from  08/07/2010. FINDINGS: Lower chest: There are patchy atelectatic changes in the visualized lung bases. No overt consolidation. No pleural effusion. The heart is normal in size. No pericardial effusion. Hepatobiliary: The liver is normal in size. Non-cirrhotic configuration. No suspicious  intrahepatic mass. However, there are multiple new heterogeneous hypoattenuating lesions along the liver capsule as well as adjacent peritoneal reflections, favored to represent tumor implants. Few of the lesions exhibit mild liver surface scalloping. No intrahepatic or extrahepatic bile duct dilation. Contracted gallbladder containing several noncalcified gallstones. Normal gallbladder wall thickness. No pericholecystic inflammatory changes. Pancreas: Unremarkable. No pancreatic ductal dilatation or surrounding inflammatory changes. Spleen: Within normal limits. No focal lesion. Adrenals/Urinary Tract: Adrenal glands are unremarkable. Small/atrophic bilateral native kidneys. There is a transplanted kidney in the right lower abdomen. No hydronephrosis. There is a subcentimeter sized hyperattenuating focus in the upper pole of the transplanted kidney, indeterminate but favored to represent a proteinaceous/hemorrhagic cyst. No hydronephrosis. No renal or ureteric calculi. Unremarkable urinary bladder. Stomach/Bowel: There is a probable small sliding hiatal hernia. There is marked circumferential thickening of the cecum/ascending colon measuring up to 2.1 cm, with extension into the terminal ileum. There is resultant dilation of distal ileal loops measuring up to 3.8 cm in diameter. The remaining distal colon is nondilated. There are multiple diverticula in the sigmoid colon without diverticulitis. Stomach and proximal small bowel loops are also nondilated. Vascular/Lymphatic: There is mild ascites mainly in the dependent pelvis and in the perihepatic/perisplenic region. There are multiple heterogeneous nodules throughout the upper abdomen, favoring peritoneal carcinomatosis. One such largest conglomerated mass in the right lower quadrant measures up to 4.8 x 6.4 cm on coronal plane (series 4, image 43). No aneurysmal dilation of the major abdominal arteries. There are moderate peripheral atherosclerotic vascular  calcifications of the aorta and its major branches. Reproductive: The uterus is unremarkable. No large adnexal mass. Other: The visualized soft tissues and abdominal wall are unremarkable. Musculoskeletal: No suspicious osseous lesions. IMPRESSION: 1. Marked circumferential thickening of the cecum/ascending colon with extension into the terminal ileum, consistent with a primary colonic neoplasm. There is resultant dilation of distal ileal loops measuring up to 3.8 cm in diameter, suggesting partial small bowel obstruction. The remaining distal colon is nondilated. 2. Multiple heterogeneous nodules throughout the abdomen, favoring peritoneal carcinomatosis. 3. Multiple other nonacute observations, as described above. Aortic Atherosclerosis (ICD10-I70.0). Electronically Signed   By: Jules Schick M.D.   On: 08/02/2022 14:06   CT ABDOMEN PELVIS WO CONTRAST  Final Result      Consults called:  GI General surgery    Lewie Chamber, MD [facility] 08/02/2022, 4:01 PM

## 2022-08-02 NOTE — ED Notes (Signed)
ED TO INPATIENT HANDOFF REPORT  Name/Age/Gender [name] 77 y.o. female  Code Status   Home/SNF/Other Home  Chief Complaint Partial small bowel obstruction (HCC) [K56.600]  Level of Care/Admitting Diagnosis ED Disposition     ED Disposition  Admit   Condition  --   Comment  Hospital Area: [facility] [[id number]]  Level of Care: Med-Surg [16]  May admit patient to [facility] or [facility] if equivalent level of care is available:: No  Covid Evaluation: Asymptomatic - no recent exposure (last 10 days) testing not required  Diagnosis: Partial small bowel obstruction [facility]) [[id number]]  Admitting Physician: Verlee Rossetti  Attending Physician: Lewie Chamber [phone]  Certification:: I certify this patient will need inpatient services for at least 2 midnights  Estimated Length of Stay: 4          Medical History Past Medical History:  Diagnosis Date   Diabetes mellitus type 2, controlled (HCC)    History of diverticulitis of colon 2016 & 2018 08/02/2022   History of kidney transplant 2011-03-10   [facility] -  ECD deceased donor kidney transplant on 2011-03-10   History of recurrent UTIs 08/02/2022   Hypertension    Renal insufficiency     Allergies Allergies  Allergen Reactions   Advil [Ibuprofen] Nausea And Vomiting   Amoxicillin-Pot Clavulanate Nausea And Vomiting   Aspirin Nausea And Vomiting   Ciprofloxacin     Other Reaction(s): dizziness   Nitrofurantoin Nausea Only   Nitrofurantoin Macrocrystal Nausea Only   Simvastatin     Other Reaction(s): mucsle aches    IV Location/Drains/Wounds Patient Lines/Drains/Airways Status     Active Line/Drains/Airways     Name Placement date Placement time Site Days   Peripheral IV 08/02/22 20 G Right Antecubital 08/02/22  1140  Antecubital  less than 1            Labs/Imaging Results for orders placed or performed during the hospital encounter of 08/02/22 (from the past 48  hour(s))  CBC with Differential     Status: Abnormal   Collection Time: 08/02/22 11:37 AM  Result Value Ref Range   WBC 8.1 4.0 - 10.5 K/uL   RBC 3.94 3.87 - 5.11 MIL/uL   Hemoglobin 11.4 (L) 12.0 - 15.0 g/dL   HCT 13.2 (L) 44.0 - 10.2 %   MCV 89.3 80.0 - 100.0 fL   MCH 28.9 26.0 - 34.0 pg   MCHC 32.4 30.0 - 36.0 g/dL   RDW 72.5 36.6 - 44.0 %   Platelets 381 150 - 400 K/uL   nRBC 0.0 0.0 - 0.2 %   Neutrophils Relative % 83 %   Neutro Abs 6.7 1.7 - 7.7 K/uL   Lymphocytes Relative 9 %   Lymphs Abs 0.7 0.7 - 4.0 K/uL   Monocytes Relative 8 %   Monocytes Absolute 0.7 0.1 - 1.0 K/uL   Eosinophils Relative 0 %   Eosinophils Absolute 0.0 0.0 - 0.5 K/uL   Basophils Relative 0 %   Basophils Absolute 0.0 0.0 - 0.1 K/uL   Immature Granulocytes 0 %   Abs Immature Granulocytes 0.02 0.00 - 0.07 K/uL    Comment: Performed at [facility], [address], [place], [place] [address]  Comprehensive metabolic panel     Status: Abnormal   Collection Time: 08/02/22 11:37 AM  Result Value Ref Range   Sodium 134 (L) 135 - 145 mmol/L   Potassium 3.2 (L) 3.5 - 5.1 mmol/L   Chloride  100 98 - 111 mmol/L   CO2 25 22 - 32 mmol/L   Glucose, Bld 229 (H) 70 - 99 mg/dL    Comment: Glucose reference range applies only to samples taken after fasting for at least 8 hours.   BUN 15 8 - 23 mg/dL   Creatinine, Ser 6.96 0.44 - 1.00 mg/dL   Calcium 8.6 (L) 8.9 - 10.3 mg/dL   Total Protein 6.6 6.5 - 8.1 g/dL   Albumin 3.3 (L) 3.5 - 5.0 g/dL   AST 14 (L) 15 - 41 U/L   ALT 12 0 - 44 U/L   Alkaline Phosphatase 45 38 - 126 U/L   Total Bilirubin 0.9 0.3 - 1.2 mg/dL   GFR, Estimated >29 >52 mL/min    Comment: (NOTE) Calculated using the CKD-EPI Creatinine Equation (2021)    Anion gap 9 5 - 15    Comment: Performed at [facility], [address], [place], [place] [address]  Lipase, blood     Status: None   Collection Time: 08/02/22 11:37 AM  Result Value Ref Range   Lipase  29 11 - 51 U/L    Comment: Performed at [facility], [address], [place], [place] [address]   CT ABDOMEN PELVIS WO CONTRAST  Result Date: 08/02/2022 CLINICAL DATA:  Abdominal pain, acute, nonlocalized EXAM: CT ABDOMEN AND PELVIS WITHOUT CONTRAST TECHNIQUE: Multidetector CT imaging of the abdomen and pelvis was performed following the standard protocol without IV contrast. RADIATION DOSE REDUCTION: This exam was performed according to the departmental dose-optimization program which includes automated exposure control, adjustment of the mA and/or kV according to patient size and/or use of iterative reconstruction technique. COMPARISON:  CT scan abdomen and pelvis from 08/07/2010. FINDINGS: Lower chest: There are patchy atelectatic changes in the visualized lung bases. No overt consolidation. No pleural effusion. The heart is normal in size. No pericardial effusion. Hepatobiliary: The liver is normal in size. Non-cirrhotic configuration. No suspicious intrahepatic mass. However, there are multiple new heterogeneous hypoattenuating lesions along the liver capsule as well as adjacent peritoneal reflections, favored to represent tumor implants. Few of the lesions exhibit mild liver surface scalloping. No intrahepatic or extrahepatic bile duct dilation. Contracted gallbladder containing several noncalcified gallstones. Normal gallbladder wall thickness. No pericholecystic inflammatory changes. Pancreas: Unremarkable. No pancreatic ductal dilatation or surrounding inflammatory changes. Spleen: Within normal limits. No focal lesion. Adrenals/Urinary Tract: Adrenal glands are unremarkable. Small/atrophic bilateral native kidneys. There is a transplanted kidney in the right lower abdomen. No hydronephrosis. There is a subcentimeter sized hyperattenuating focus in the upper pole of the transplanted kidney, indeterminate but favored to represent a proteinaceous/hemorrhagic cyst. No hydronephrosis.  No renal or ureteric calculi. Unremarkable urinary bladder. Stomach/Bowel: There is a probable small sliding hiatal hernia. There is marked circumferential thickening of the cecum/ascending colon measuring up to 2.1 cm, with extension into the terminal ileum. There is resultant dilation of distal ileal loops measuring up to 3.8 cm in diameter. The remaining distal colon is nondilated. There are multiple diverticula in the sigmoid colon without diverticulitis. Stomach and proximal small bowel loops are also nondilated. Vascular/Lymphatic: There is mild ascites mainly in the dependent pelvis and in the perihepatic/perisplenic region. There are multiple heterogeneous nodules throughout the upper abdomen, favoring peritoneal carcinomatosis. One such largest conglomerated mass in the right lower quadrant measures up to 4.8 x 6.4 cm on coronal plane (series 4, image 43). No aneurysmal dilation of the major abdominal arteries. There are moderate peripheral atherosclerotic vascular  calcifications of the aorta and its major branches. Reproductive: The uterus is unremarkable. No large adnexal mass. Other: The visualized soft tissues and abdominal wall are unremarkable. Musculoskeletal: No suspicious osseous lesions. IMPRESSION: 1. Marked circumferential thickening of the cecum/ascending colon with extension into the terminal ileum, consistent with a primary colonic neoplasm. There is resultant dilation of distal ileal loops measuring up to 3.8 cm in diameter, suggesting partial small bowel obstruction. The remaining distal colon is nondilated. 2. Multiple heterogeneous nodules throughout the abdomen, favoring peritoneal carcinomatosis. 3. Multiple other nonacute observations, as described above. Aortic Atherosclerosis (ICD10-I70.0). Electronically Signed   By: Jules Schick M.D.   On: 08/02/2022 14:06    Pending Labs Unresulted Labs (From admission, onward)     Start     Ordered   08/02/22 1118  Urinalysis, Routine w  reflex microscopic -Urine, Clean Catch  Once,   URGENT       Question:  Specimen Source  Answer:  Urine, Clean Catch   08/02/22 1118            Vitals/Pain Today's Vitals   08/02/22 1230 08/02/22 1240 08/02/22 1430 08/02/22 1458  BP: (!) 172/88  (!) 160/85   Pulse: 95  88   Resp: 17  17   Temp:    98.2 F (36.8 C)  TempSrc:    Oral  SpO2: 100%  100%   Weight:      Height:      PainSc:  3       Isolation Precautions No active isolations  Medications Medications  sodium chloride 0.9 % bolus 1,000 mL (0 mLs Intravenous Stopped 08/02/22 1331)  ondansetron (ZOFRAN) injection 4 mg (4 mg Intravenous Given 08/02/22 1206)  fentaNYL (SUBLIMAZE) injection 50 mcg (50 mcg Intravenous Given 08/02/22 1206)  potassium chloride 10 mEq in 100 mL IVPB (0 mEq Intravenous Stopped 08/02/22 1450)  acetaminophen (TYLENOL) tablet 650 mg (650 mg Oral Given 08/02/22 1432)    Mobility walks

## 2022-08-03 ENCOUNTER — Encounter (HOSPITAL_COMMUNITY): Payer: Self-pay | Admitting: Anesthesiology

## 2022-08-03 DIAGNOSIS — Z940 Kidney transplant status: Secondary | ICD-10-CM | POA: Diagnosis not present

## 2022-08-03 DIAGNOSIS — K56600 Partial intestinal obstruction, unspecified as to cause: Secondary | ICD-10-CM | POA: Diagnosis not present

## 2022-08-03 DIAGNOSIS — K6389 Other specified diseases of intestine: Secondary | ICD-10-CM | POA: Diagnosis not present

## 2022-08-03 LAB — CBC WITH DIFFERENTIAL/PLATELET
Abs Immature Granulocytes: 0.03 10*3/uL (ref 0.00–0.07)
Basophils Absolute: 0 10*3/uL (ref 0.0–0.1)
Basophils Relative: 1 %
Eosinophils Absolute: 0 10*3/uL (ref 0.0–0.5)
Eosinophils Relative: 0 %
HCT: 35 % — ABNORMAL LOW (ref 36.0–46.0)
Hemoglobin: 11.3 g/dL — ABNORMAL LOW (ref 12.0–15.0)
Immature Granulocytes: 0 %
Lymphocytes Relative: 22 %
Lymphs Abs: 1.6 10*3/uL (ref 0.7–4.0)
MCH: 29.3 pg (ref 26.0–34.0)
MCHC: 32.3 g/dL (ref 30.0–36.0)
MCV: 90.7 fL (ref 80.0–100.0)
Monocytes Absolute: 0.8 10*3/uL (ref 0.1–1.0)
Monocytes Relative: 11 %
Neutro Abs: 4.9 10*3/uL (ref 1.7–7.7)
Neutrophils Relative %: 66 %
Platelets: 336 10*3/uL (ref 150–400)
RBC: 3.86 MIL/uL — ABNORMAL LOW (ref 3.87–5.11)
RDW: 13.5 % (ref 11.5–15.5)
WBC: 7.4 10*3/uL (ref 4.0–10.5)
nRBC: 0 % (ref 0.0–0.2)

## 2022-08-03 LAB — BASIC METABOLIC PANEL
Anion gap: 10 (ref 5–15)
BUN: 11 mg/dL (ref 8–23)
CO2: 21 mmol/L — ABNORMAL LOW (ref 22–32)
Calcium: 8.2 mg/dL — ABNORMAL LOW (ref 8.9–10.3)
Chloride: 106 mmol/L (ref 98–111)
Creatinine, Ser: 0.62 mg/dL (ref 0.44–1.00)
GFR, Estimated: >60 mL/min (ref >60)
Glucose, Bld: 126 mg/dL — ABNORMAL HIGH (ref 70–99)
Potassium: 3.2 mmol/L — ABNORMAL LOW (ref 3.5–5.1)
Sodium: 137 mmol/L (ref 135–145)

## 2022-08-03 LAB — GLUCOSE, CAPILLARY
Glucose-Capillary: 114 mg/dL — ABNORMAL HIGH (ref 70–99)
Glucose-Capillary: 122 mg/dL — ABNORMAL HIGH (ref 70–99)
Glucose-Capillary: 126 mg/dL — ABNORMAL HIGH (ref 70–99)
Glucose-Capillary: 145 mg/dL — ABNORMAL HIGH (ref 70–99)
Glucose-Capillary: 170 mg/dL — ABNORMAL HIGH (ref 70–99)
Glucose-Capillary: 180 mg/dL — ABNORMAL HIGH (ref 70–99)

## 2022-08-03 LAB — MAGNESIUM: Magnesium: 1.5 mg/dL — ABNORMAL LOW (ref 1.7–2.4)

## 2022-08-03 MED ORDER — POTASSIUM CHLORIDE 10 MEQ/100ML IV SOLN
10.0000 meq | INTRAVENOUS | Status: AC
  Administered 2022-08-03 (×4): 10 meq via INTRAVENOUS
  Filled 2022-08-03: qty 100

## 2022-08-03 MED ORDER — MAGNESIUM SULFATE 4 GM/100ML IV SOLN
4.0000 g | Freq: Once | INTRAVENOUS | Status: AC
  Administered 2022-08-03: 4 g via INTRAVENOUS
  Filled 2022-08-03: qty 100

## 2022-08-03 MED ORDER — TACROLIMUS 1 MG PO CAPS: 3.0000 mg | ORAL_CAPSULE | Freq: Every day | ORAL | Status: DC

## 2022-08-03 MED ORDER — TACROLIMUS 1 MG PO CAPS
3.0000 mg | ORAL_CAPSULE | Freq: Every day | ORAL | Status: DC
  Administered 2022-08-04 – 2022-08-05 (×2): 3 mg via ORAL

## 2022-08-03 MED ORDER — ALUM & MAG HYDROXIDE-SIMETH 200-200-20 MG/5ML PO SUSP
15.0000 mL | Freq: Four times a day (QID) | ORAL | Status: DC | PRN
  Administered 2022-08-03: 15 mL via ORAL
  Filled 2022-08-03: qty 30

## 2022-08-03 MED ORDER — PEG 3350-KCL-NA BICARB-NACL 420 G PO SOLR
4000.0000 mL | Freq: Once | ORAL | Status: AC
  Administered 2022-08-03: 4000 mL via ORAL

## 2022-08-03 MED ORDER — IOHEXOL 9 MG/ML PO SOLN
500.0000 mL | ORAL | Status: AC
  Administered 2022-08-03: 500 mL via ORAL

## 2022-08-03 MED ORDER — BISACODYL 5 MG PO TBEC
5.0000 mg | DELAYED_RELEASE_TABLET | Freq: Once | ORAL | Status: AC
  Administered 2022-08-03: 5 mg via ORAL
  Filled 2022-08-03: qty 1

## 2022-08-03 MED ORDER — IOHEXOL 9 MG/ML PO SOLN
ORAL | Status: AC
  Filled 2022-08-03: qty 1000

## 2022-08-03 MED ORDER — AMLODIPINE BESYLATE 10 MG PO TABS
10.0000 mg | ORAL_TABLET | Freq: Every day | ORAL | Status: DC
  Administered 2022-08-03 – 2022-08-05 (×3): 10 mg via ORAL
  Filled 2022-08-03 (×3): qty 1

## 2022-08-03 MED ORDER — TACROLIMUS 1 MG PO CAPS
2.0000 mg | ORAL_CAPSULE | Freq: Every day | ORAL | Status: DC
  Administered 2022-08-03: 2 mg via ORAL

## 2022-08-03 MED ORDER — PANTOPRAZOLE SODIUM 40 MG IV SOLR
40.0000 mg | Freq: Every day | INTRAVENOUS | Status: DC
  Administered 2022-08-03 – 2022-08-05 (×3): 40 mg via INTRAVENOUS
  Filled 2022-08-03 (×3): qty 10

## 2022-08-03 NOTE — Progress Notes (Signed)
Mobility Specialist - Progress Note   08/03/22 1021  Mobility  Activity Ambulated independently to bathroom  Level of Assistance Modified independent, requires aide device or extra time  Assistive Device Other (Comment) (IV Pole)  Distance Ambulated (ft) 20 ft  Activity Response Tolerated well  Mobility Referral Yes  $Mobility charge 1 Mobility  Mobility Specialist Start Time (ACUTE ONLY) 1012  Mobility Specialist Stop Time (ACUTE ONLY) 1021  Mobility Specialist Time Calculation (min) (ACUTE ONLY) 9 min   Pt received in bed declining mobility but requesting assistance to the bathroom. Assisted pt to the bathroom. Pt voiced wants to ambulate this afternoon. Pt to bed after session with all needs met.   [facility]

## 2022-08-03 NOTE — Progress Notes (Signed)
Subjective/Chief Complaint: Still feeling bloated, nauseated. No vomiting No flatus or BM overnight   Objective: Vital signs in last 24 hours: Temp:  [98.1 F (36.7 C)-98.5 F (36.9 C)] 98.3 F (36.8 C) (07/20 0629) Pulse Rate:  [63-99] 92 (07/20 0631) Resp:  [14-18] 14 (07/20 0629) BP: (131-196)/(65-96) 167/96 (07/20 0631) SpO2:  [96 %-100 %] 96 % (07/20 0631) Weight:  [47.6 kg] 47.6 kg (07/19 1114) Last BM Date : 08/02/22  Intake/Output from previous day: 07/19 0701 - 07/20 0700 In: 1787.6 [I.V.:787.1; IV Piggyback:1000.5] Out: -  Intake/Output this shift: No intake/output data recorded.  Abd - mildly distended; RUQ tenderness  Lab Results:  Recent Labs    08/02/22 1137 08/03/22 0315  WBC 8.1 7.4  HGB 11.4* 11.3*  HCT 35.2* 35.0*  PLT 381 336   BMET Recent Labs    08/02/22 1137 08/03/22 0315  NA 134* 137  K 3.2* 3.2*  CL 100 106  CO2 25 21*  GLUCOSE 229* 126*  BUN 15 11  CREATININE 0.73 0.62  CALCIUM 8.6* 8.2*   PT/INR No results for input(s): "LABPROT", "INR" in the last 72 hours. ABG No results for input(s): "PHART", "HCO3" in the last 72 hours.  Invalid input(s): "PCO2", "PO2"  Studies/Results: CT ABDOMEN PELVIS WO CONTRAST  Result Date: 08/02/2022 CLINICAL DATA:  Abdominal pain, acute, nonlocalized EXAM: CT ABDOMEN AND PELVIS WITHOUT CONTRAST TECHNIQUE: Multidetector CT imaging of the abdomen and pelvis was performed following the standard protocol without IV contrast. RADIATION DOSE REDUCTION: This exam was performed according to the departmental dose-optimization program which includes automated exposure control, adjustment of the mA and/or kV according to patient size and/or use of iterative reconstruction technique. COMPARISON:  CT scan abdomen and pelvis from 08/07/2010. FINDINGS: Lower chest: There are patchy atelectatic changes in the visualized lung bases. No overt consolidation. No pleural effusion. The heart is normal in size. No  pericardial effusion. Hepatobiliary: The liver is normal in size. Non-cirrhotic configuration. No suspicious intrahepatic mass. However, there are multiple new heterogeneous hypoattenuating lesions along the liver capsule as well as adjacent peritoneal reflections, favored to represent tumor implants. Few of the lesions exhibit mild liver surface scalloping. No intrahepatic or extrahepatic bile duct dilation. Contracted gallbladder containing several noncalcified gallstones. Normal gallbladder wall thickness. No pericholecystic inflammatory changes. Pancreas: Unremarkable. No pancreatic ductal dilatation or surrounding inflammatory changes. Spleen: Within normal limits. No focal lesion. Adrenals/Urinary Tract: Adrenal glands are unremarkable. Small/atrophic bilateral native kidneys. There is a transplanted kidney in the right lower abdomen. No hydronephrosis. There is a subcentimeter sized hyperattenuating focus in the upper pole of the transplanted kidney, indeterminate but favored to represent a proteinaceous/hemorrhagic cyst. No hydronephrosis. No renal or ureteric calculi. Unremarkable urinary bladder. Stomach/Bowel: There is a probable small sliding hiatal hernia. There is marked circumferential thickening of the cecum/ascending colon measuring up to 2.1 cm, with extension into the terminal ileum. There is resultant dilation of distal ileal loops measuring up to 3.8 cm in diameter. The remaining distal colon is nondilated. There are multiple diverticula in the sigmoid colon without diverticulitis. Stomach and proximal small bowel loops are also nondilated. Vascular/Lymphatic: There is mild ascites mainly in the dependent pelvis and in the perihepatic/perisplenic region. There are multiple heterogeneous nodules throughout the upper abdomen, favoring peritoneal carcinomatosis. One such largest conglomerated mass in the right lower quadrant measures up to 4.8 x 6.4 cm on coronal plane (series 4, image 43). No  aneurysmal dilation of the major abdominal arteries. There are moderate peripheral  atherosclerotic vascular calcifications of the aorta and its major branches. Reproductive: The uterus is unremarkable. No large adnexal mass. Other: The visualized soft tissues and abdominal wall are unremarkable. Musculoskeletal: No suspicious osseous lesions. IMPRESSION: 1. Marked circumferential thickening of the cecum/ascending colon with extension into the terminal ileum, consistent with a primary colonic neoplasm. There is resultant dilation of distal ileal loops measuring up to 3.8 cm in diameter, suggesting partial small bowel obstruction. The remaining distal colon is nondilated. 2. Multiple heterogeneous nodules throughout the abdomen, favoring peritoneal carcinomatosis. 3. Multiple other nonacute observations, as described above. Aortic Atherosclerosis (ICD10-I70.0). Electronically Signed   By: Jules Schick M.D.   On: 08/02/2022 14:06    Anti-infectives: Anti-infectives (From admission, onward)    Start     Dose/Rate Route Frequency Ordered Stop   08/05/22 1000  sulfamethoxazole-trimethoprim (BACTRIM) 400-80 MG per tablet 1 tablet        1 tablet Oral Once per day on Monday Wednesday Friday 08/02/22 1602         Assessment/Plan: Partial small bowel obstruction Cecal/ ascending colon mass - likely adenocarcinoma with evidence of peritoneal carcinomatosis  Recs: CEA level (ordered) GI consult for colonoscopy for biopsies to establish definitive diagnosis. Staging CT scan of Chest/ abdomen/ pelvis with contrast if suitable from renal function perspective  Oncology evaluation once diagnosis established.  Patient declines NG tube for now, but I explained to her that she will need it if she becomes more distended or begins vomiting.  LOS: 1 day    Wynona Luna 08/03/2022

## 2022-08-03 NOTE — Progress Notes (Addendum)
Progress Note    [name]   URK:[id number]  DOB: December 02, 1945  DOA: 08/02/2022     1 PCP: Mila Palmer, MD  Initial CC: abdominal pain, N/V  Hospital Course: [name] is a 77 yo female with PMH renal transplant (02/17/11), osteoarthritis, HLD, DM II who presented to the hospital with abdominal pain and nausea/vomiting. She endorsed that symptoms had began approximately 1 week prior to hospitalization but started to dramatically worsen 2 days ago.  She has had unintentional weight loss over the past 1 month approximately 15 pounds.  She is typically cold natured but endorses some chills recently.  She denied any fevers or night sweats.  CT abdomen/pelvis was performed in the ER.  This showed circumferential thickening of the cecum/ascending colon with extension into the terminal ileum concerning for colonic neoplasm.  Dilated distal ileal loops noted suggesting partial SBO. General surgery and GI were consulted.  Patient admitted for further workup.  Interval History:  Still having ongoing nausea and abdominal discomfort.  Working on trying to take pills this morning.  Assessment and Plan: * Partial small bowel obstruction (HCC) - presumed due to underlying colonic mass (measures 4.8 x 6.4 cm in RLQ) per CT - GI and general surgery following - NPO per surgery for now - continue IVF, nausea, and pain control - tentative plan for colonoscopy per GI/OR schedule and/or if patient able to tolerate colon prep adequately  - last adequate meal per patient ~7/13. Discussed with surgery; she'll need TPN at this rate; ordered for powerline CVL after discussion with nephrology; no PICC given hx renal transplant and LUE prior fistula  Ascending colonic mass - per CT: "largest conglomerated mass in the right lower quadrant measures up to 4.8 x 6.4 cm" - "Marked circumferential thickening of the cecum/ascending colon with extension into the terminal ileum, consistent with a primary colonic  neoplasm. There is resultant dilation of distal ileal loops measuring up to 3.8 cm in diameter, suggesting partial small bowel obstruction. The remaining distal colon is nondilated." - suspect will need colonoscopy for biopsy vs resection; will defer to GI/General surgery  - discussed with nephrology (Dr. Marisue Humble) as well; okay for contrast CT for staging purposes; no formal consult needed at this time  History of kidney transplant - RLQ - 2013 - transplanted in 2013; DDKT - continue prograf (patient may use home supply) and mycophenolate - continue prednisone - continue MWF Bactrim for ppx  Normocytic anemia - mild; baseline Hgb 11-12 g/dL - currently at baseline   Hypokalemia - replete as needed  Essential hypertension - hold clonidine and other home meds for now; trying to ease pill burden - will at least resume amlodipine and increase dose for now - PRN Labetalol or hydralazine   Type 2 diabetes mellitus with complication (HCC) - hold basal insulin for now - q4h SSI - A1c 8.1%   Old records reviewed in assessment of this patient  Antimicrobials:   DVT prophylaxis:  enoxaparin (LOVENOX) injection 40 mg Start: 08/03/22 1000   Code Status:   Code Status: Full Code  Mobility Assessment (Last 72 Hours)     Mobility Assessment     Row Name 08/03/22 0734 08/02/22 2020 08/02/22 1606       Does patient have an order for bedrest or is patient medically unstable No - Continue assessment No - Continue assessment No - Continue assessment     What is the highest level of mobility based on the progressive mobility assessment? Level  5 (Walks with assist in room/hall) - Balance while stepping forward/back and can walk in room with assist - Complete Level 5 (Walks with assist in room/hall) - Balance while stepping forward/back and can walk in room with assist - Complete Level 5 (Walks with assist in room/hall) - Balance while stepping forward/back and can walk in room with assist -  Complete             Mobility Assessment (Last 72 Hours)     Mobility Assessment     Row Name 08/03/22 0734 08/02/22 2020 08/02/22 1606       Does patient have an order for bedrest or is patient medically unstable No - Continue assessment No - Continue assessment No - Continue assessment     What is the highest level of mobility based on the progressive mobility assessment? Level 5 (Walks with assist in room/hall) - Balance while stepping forward/back and can walk in room with assist - Complete Level 5 (Walks with assist in room/hall) - Balance while stepping forward/back and can walk in room with assist - Complete Level 5 (Walks with assist in room/hall) - Balance while stepping forward/back and can walk in room with assist - Complete              Barriers to discharge: none Disposition Plan:  Home Status is: Inpt  Objective: Blood pressure (!) 174/85, pulse (!) 105, temperature 97.7 F (36.5 C), temperature source Oral, resp. rate 17, height 4\' 11"  (1.499 m), weight 47.6 kg, SpO2 100%.  Examination:  Physical Exam Constitutional:      General: She is not in acute distress.    Appearance: She is well-developed. She is not ill-appearing.  HENT:     Head: Normocephalic and atraumatic.     Mouth/Throat:     Mouth: Mucous membranes are moist.  Eyes:     Extraocular Movements: Extraocular movements intact.  Cardiovascular:     Rate and Rhythm: Normal rate and regular rhythm.  Pulmonary:     Effort: Pulmonary effort is normal. No respiratory distress.     Breath sounds: Normal breath sounds. No wheezing.  Abdominal:     General: Bowel sounds are normal. There is no distension.     Palpations: Abdomen is soft.     Tenderness: There is abdominal tenderness (mild TTP nonspecific).  Musculoskeletal:        General: Normal range of motion.     Cervical back: Normal range of motion and neck supple.  Skin:    General: Skin is warm and dry.  Neurological:     General: No  focal deficit present.     Mental Status: She is alert.  Psychiatric:        Mood and Affect: Mood normal.        Behavior: Behavior normal.      Consultants:  GI General surgery  Procedures:    Data Reviewed: Results for orders placed or performed during the hospital encounter of 08/02/22 (from the past 24 hour(s))  Glucose, capillary     Status: Abnormal   Collection Time: 08/02/22  5:43 PM  Result Value Ref Range   Glucose-Capillary 204 (H) 70 - 99 mg/dL  Glucose, capillary     Status: Abnormal   Collection Time: 08/02/22  8:00 PM  Result Value Ref Range   Glucose-Capillary 183 (H) 70 - 99 mg/dL  Glucose, capillary     Status: Abnormal   Collection Time: 08/03/22 12:02 AM  Result Value Ref Range  Glucose-Capillary 114 (H) 70 - 99 mg/dL  Basic metabolic panel     Status: Abnormal   Collection Time: 08/03/22  3:15 AM  Result Value Ref Range   Sodium 137 135 - 145 mmol/L   Potassium 3.2 (L) 3.5 - 5.1 mmol/L   Chloride 106 98 - 111 mmol/L   CO2 21 (L) 22 - 32 mmol/L   Glucose, Bld 126 (H) 70 - 99 mg/dL   BUN 11 8 - 23 mg/dL   Creatinine, Ser 6.29 0.44 - 1.00 mg/dL   Calcium 8.2 (L) 8.9 - 10.3 mg/dL   GFR, Estimated >52 >84 mL/min   Anion gap 10 5 - 15  CBC with Differential/Platelet     Status: Abnormal   Collection Time: 08/03/22  3:15 AM  Result Value Ref Range   WBC 7.4 4.0 - 10.5 K/uL   RBC 3.86 (L) 3.87 - 5.11 MIL/uL   Hemoglobin 11.3 (L) 12.0 - 15.0 g/dL   HCT 13.2 (L) 44.0 - 10.2 %   MCV 90.7 80.0 - 100.0 fL   MCH 29.3 26.0 - 34.0 pg   MCHC 32.3 30.0 - 36.0 g/dL   RDW 72.5 36.6 - 44.0 %   Platelets 336 150 - 400 K/uL   nRBC 0.0 0.0 - 0.2 %   Neutrophils Relative % 66 %   Neutro Abs 4.9 1.7 - 7.7 K/uL   Lymphocytes Relative 22 %   Lymphs Abs 1.6 0.7 - 4.0 K/uL   Monocytes Relative 11 %   Monocytes Absolute 0.8 0.1 - 1.0 K/uL   Eosinophils Relative 0 %   Eosinophils Absolute 0.0 0.0 - 0.5 K/uL   Basophils Relative 1 %   Basophils Absolute 0.0 0.0 -  0.1 K/uL   Immature Granulocytes 0 %   Abs Immature Granulocytes 0.03 0.00 - 0.07 K/uL  Magnesium     Status: Abnormal   Collection Time: 08/03/22  3:15 AM  Result Value Ref Range   Magnesium 1.5 (L) 1.7 - 2.4 mg/dL  Glucose, capillary     Status: Abnormal   Collection Time: 08/03/22  4:52 AM  Result Value Ref Range   Glucose-Capillary 126 (H) 70 - 99 mg/dL  Glucose, capillary     Status: Abnormal   Collection Time: 08/03/22  7:44 AM  Result Value Ref Range   Glucose-Capillary 145 (H) 70 - 99 mg/dL  Glucose, capillary     Status: Abnormal   Collection Time: 08/03/22 11:55 AM  Result Value Ref Range   Glucose-Capillary 170 (H) 70 - 99 mg/dL    I have reviewed pertinent nursing notes, vitals, labs, and images as necessary. I have ordered labwork to follow up on as indicated.  I have reviewed the last notes from staff over past 24 hours. I have discussed patient's care plan and test results with nursing staff, CM/SW, and other staff as appropriate.  Time spent: Greater than 50% of the 55 minute visit was spent in counseling/coordination of care for the patient as laid out in the A&P.   LOS: 1 day   Lewie Chamber, MD [facility] 08/03/2022, 2:23 PM

## 2022-08-03 NOTE — Plan of Care (Signed)
  Problem: Education: Goal: Ability to describe self-care measures that may prevent or decrease complications (Diabetes Survival Skills Education) will improve Outcome: Progressing   Problem: Education: Goal: Knowledge of General Education information will improve Description: Including pain rating scale, medication(s)/side effects and non-pharmacologic comfort measures Outcome: Progressing   Problem: Activity: Goal: Risk for activity intolerance will decrease Outcome: Progressing   

## 2022-08-03 NOTE — Consult Note (Signed)
[facility] Gastroenterology Consult  Referring Provider: No ref. provider found Primary Care Physician:  Mila Palmer, MD Primary Gastroenterologist: Gentry Fitz  Reason for Consultation: Colon mass  SUBJECTIVE:   HPI: [name] is a 77 y.o. female with past medical history significant for diabetes mellitus, hyperlipidemia, osteoarthritis.  Surgical history significant for renal transplant in February 2013.  Presented to hospital on 08/02/2022 with nausea, vomiting and abdominal pain.  Patient noted that her abdominal pain is located in mid right abdomen, has been occurring for the past 3 days.  She has had associated nausea and vomiting, nonbloody in nature.  She has been having loose stools at home with some blood.  She denied any chest pain.  She noted having intermittent shortness of breath.  No fevers or chills.  She noted that she has lost roughly 100 pounds over the past 1 year, unintentionally.  She noted brother with colon cancer deceased at age 40.  Last colonoscopy 02/24/2009 [facility]-Dr. Catalina Gravel) for colorectal cancer screening-ulcer at [facility] valve status post biopsy (biopsy showed colitis nonspecific with reactive changes), otherwise colonoscopy within normal limits.  On presentation CT imaging showed circumferential thickening in the cecum/ascending colon with extension into the terminal ileum consistent with primary colonic neoplasm, dilation of distal ileal loops suggesting partial small bowel obstruction, multiple heterogenous nodules throughout the abdomen favoring peritoneal carcinomatosis.  Labs showed WBC 8.1, hemoglobin 11.4, platelet 381, BUN/creatinine 15/0.73.  On evaluation today, patient noted that she is not doing well.  She continues to have right-sided abdominal pain.  She has some distention.  She has not had stool nor flatus recently.  She has nausea though has not had further vomiting.  No chest pain.  Past Medical History:  Diagnosis Date    Diabetes mellitus type 2, controlled (HCC)    History of diverticulitis of colon 2016 & 2018 08/02/2022   History of kidney transplant 2011/02/27   [facility] -  ECD deceased donor kidney transplant on Feb 27, 2011   History of recurrent UTIs 08/02/2022   Hypertension    Renal insufficiency    Past Surgical History:  Procedure Laterality Date   KIDNEY TRANSPLANT  02/27/2011   ECD deceased donor kidney transplant at [facility]   PERITONEAL CATHETER INSERTION  06/20/2010   PERITONEAL CATHETER REMOVAL  08/09/2010   Prior to Admission medications   Medication Sig Start Date End Date Taking? Authorizing Provider  amLODipine (NORVASC) 5 MG tablet Take 5 mg by mouth daily.   Yes [provider]  atorvastatin (LIPITOR) 10 MG tablet Take 10 mg by mouth See admin instructions. Take 10 mg by mouth two to three times a week   Yes [provider]  BIOTIN PO Take 1 tablet by mouth daily with breakfast.   Yes [provider]  Cholecalciferol (VITAMIN D3) 50 MCG (2000 UT) TABS Take 2,000 Units by mouth daily with breakfast.   Yes [provider]  cloNIDine (CATAPRES) 0.1 MG tablet Take 0.1 mg by mouth 2 (two) times daily.   Yes [provider]  magnesium oxide (MAG-OX) 400 MG tablet Take 400 mg by mouth daily.   Yes [provider]  metFORMIN (GLUCOPHAGE-XR) 500 MG 24 hr tablet Take 500 mg by mouth 2 (two) times daily.   Yes [provider]  mycophenolate (MYFORTIC) 180 MG EC tablet Take 180 mg by mouth 2 (two) times daily.   Yes [provider]  potassium chloride SA (K-DUR,KLOR-CON) 20 MEQ tablet Take 20 mEq by mouth daily.  Yes [provider]  predniSONE (DELTASONE) 5 MG tablet Take 5 mg by mouth daily with breakfast.   Yes [provider]  sulfamethoxazole-trimethoprim (BACTRIM) 400-80 MG tablet Take 1 tablet by mouth every Monday, Wednesday, and Friday.   Yes [provider]  tacrolimus (PROGRAF) 1  MG capsule Take 2-3 mg by mouth 2 (two) times daily. Take 3mg  daily in the morning and 2mg  daily at night.   Yes [provider]  TYLENOL 500 MG tablet Take 500-1,000 mg by mouth every 6 (six) hours as needed (for headaches or pain).   Yes [provider]  clotrimazole (GYNE-LOTRIMIN) 1 % vaginal cream Place 1 Applicatorful vaginally at bedtime. Patient not taking: Reported on 08/02/2022 03/01/22   Wallis Bamberg, PA-C  Dulaglutide (TRULICITY) 0.75 MG/0.5ML SOPN Inject 0.75 mg into the skin every Sunday. Patient not taking: Reported on 08/02/2022    [provider]  fluticasone (FLONASE) 50 MCG/ACT nasal spray Place 2 sprays into the nose daily. Patient not taking: Reported on 08/02/2022 01/13/11 08/02/22  Fayrene Helper, PA-C   Current Facility-Administered Medications  Medication Dose Route Frequency Provider Last Rate Last Admin   0.9 %  sodium chloride infusion   Intravenous Continuous Lewie Chamber, MD 75 mL/hr at 08/02/22 1805 New Bag at 08/02/22 1805   acetaminophen (TYLENOL) tablet 650 mg  650 mg Oral Q6H PRN Lewie Chamber, MD   650 mg at 08/02/22 2321   Or   acetaminophen (TYLENOL) suppository 650 mg  650 mg Rectal Q6H PRN Lewie Chamber, MD       alum & mag hydroxide-simeth (MAALOX/MYLANTA) 200-200-20 MG/5ML suspension 15 mL  15 mL Oral Q6H PRN Lewie Chamber, MD   15 mL at 08/03/22 1000   enoxaparin (LOVENOX) injection 40 mg  40 mg Subcutaneous Daily Lewie Chamber, MD   40 mg at 08/03/22 9147   hydrALAZINE (APRESOLINE) injection 10 mg  10 mg Intravenous Q4H PRN Lewie Chamber, MD       insulin aspart (novoLOG) injection 0-9 Units  0-9 Units Subcutaneous Q4H Lewie Chamber, MD   2 Units at 08/02/22 2014   labetalol (NORMODYNE) injection 10 mg  10 mg Intravenous Q4H PRN Lewie Chamber, MD       magnesium sulfate IVPB 4 g 100 mL  4 g Intravenous Once Lewie Chamber, MD 50 mL/hr at 08/03/22 0908 4 g at 08/03/22 0908   morphine (PF) 2 MG/ML injection 2 mg  2 mg  Intravenous Q2H PRN Lewie Chamber, MD   2 mg at 08/03/22 0617   mycophenolate (MYFORTIC) EC tablet 180 mg  180 mg Oral BID Lewie Chamber, MD   180 mg at 08/03/22 0852   ondansetron (ZOFRAN) tablet 4 mg  4 mg Oral Q6H PRN Lewie Chamber, MD       Or   ondansetron [facility]) injection 4 mg  4 mg Intravenous Q6H PRN Lewie Chamber, MD       pantoprazole (PROTONIX) injection 40 mg  40 mg Intravenous Daily Lewie Chamber, MD   40 mg at 08/03/22 1000   potassium chloride 10 mEq in 100 mL IVPB  10 mEq Intravenous Q1 Hr x 4 Girguis, David, MD       predniSONE (DELTASONE) tablet 5 mg  5 mg Oral Q breakfast Lewie Chamber, MD   5 mg at 08/03/22 0908   sodium chloride flush (NS) 0.9 % injection 3 mL  3 mL Intravenous Orlene Erm, MD   3 mL at 08/03/22 0948   [START ON  08/05/2022] sulfamethoxazole-trimethoprim (BACTRIM) 400-80 MG per tablet 1 tablet  1 tablet Oral Once per day on Monday Wednesday Friday Lewie Chamber, MD       tacrolimus (PROGRAF) capsule 3 mg  3 mg Oral Q breakfast Lewie Chamber, MD   3 mg at 08/03/22 9604   And   tacrolimus (PROGRAF) capsule 2 mg  2 mg Oral Axel Filler, MD   2 mg at 08/02/22 2253   Allergies as of 08/02/2022 - Review Complete 08/02/2022  Allergen Reaction Noted   Amoxicillin-pot clavulanate Nausea And Vomiting 08/02/2022   Aspirin Nausea And Vomiting and Other (See Comments) 03/07/2011   Ciprofloxacin Other (See Comments) 08/02/2022   Fexofenadine Other (See Comments) 08/02/2022   Ibuprofen Nausea And Vomiting and Other (See Comments) 03/07/2011   Nitrofurantoin Nausea Only and Other (See Comments) 08/27/2018   Nitrofurantoin macrocrystal Nausea Only 01/23/2021   Simvastatin Other (See Comments) 08/02/2022   Family History  Problem Relation Age of Onset   Healthy Mother    Cancer Father    Social History   Socioeconomic History   Marital status: Married    Spouse name: Not on file   Number of children: Not on file   Years of education: Not  on file   Highest education level: Not on file  Occupational History   Not on file  Tobacco Use   Smoking status: Never   Smokeless tobacco: Never  Vaping Use   Vaping status: Never Used  Substance and Sexual Activity   Alcohol use: No   Drug use: No   Sexual activity: Not on file  Other Topics Concern   Not on file  Social History Narrative   Pt is right handed   Lives in single story home with her husband   Has 2 adult children   High school graduate   Retired Agricultural consultant for [employer]   Social Determinants of Health   Financial Resource Strain: Not on file  Food Insecurity: No Food Insecurity (08/02/2022)   Hunger Vital Sign    Worried About Running Out of Food in the Last Year: Never true    Ran Out of Food in the Last Year: Never true  Transportation Needs: No Transportation Needs (08/02/2022)   PRAPARE - Administrator, Civil Service (Medical): No    Lack of Transportation (Non-Medical): No  Physical Activity: Not on file  Stress: Not on file  Social Connections: Not on file  Intimate Partner Violence: Not At Risk (08/02/2022)   Humiliation, Afraid, Rape, and Kick questionnaire    Fear of Current or Ex-Partner: No    Emotionally Abused: No    Physically Abused: No    Sexually Abused: No   Review of Systems:  Review of Systems  Constitutional:  Positive for weight loss.  Respiratory:  Positive for shortness of breath.   Cardiovascular:  Negative for chest pain.  Gastrointestinal:  Positive for abdominal pain, blood in stool, diarrhea, nausea and vomiting.    OBJECTIVE:   Temp:  [98.1 F (36.7 C)-98.5 F (36.9 C)] 98.3 F (36.8 C) (07/20 0629) Pulse Rate:  [63-99] 92 (07/20 0631) Resp:  [14-18] 14 (07/20 0629) BP: (131-196)/(65-96) 167/96 (07/20 0631) SpO2:  [96 %-100 %] 96 % (07/20 0631) Weight:  [47.6 kg] 47.6 kg (07/19 1114) Last BM Date : 08/02/22 Physical Exam Constitutional:      General: She is not in acute distress.    Appearance:  She is not ill-appearing, toxic-appearing or diaphoretic.  Cardiovascular:  Rate and Rhythm: Tachycardia present. Rhythm irregular.  Pulmonary:     Effort: No respiratory distress.     Breath sounds: Normal breath sounds.  Abdominal:     General: Bowel sounds are normal. There is no distension.     Palpations: Abdomen is soft.     Tenderness: There is abdominal tenderness. There is no guarding.  Neurological:     Mental Status: She is alert.     Labs: Recent Labs    08/02/22 1137 08/03/22 0315  WBC 8.1 7.4  HGB 11.4* 11.3*  HCT 35.2* 35.0*  PLT 381 336   BMET Recent Labs    08/02/22 1137 08/03/22 0315  NA 134* 137  K 3.2* 3.2*  CL 100 106  CO2 25 21*  GLUCOSE 229* 126*  BUN 15 11  CREATININE 0.73 0.62  CALCIUM 8.6* 8.2*   LFT Recent Labs    08/02/22 1137  PROT 6.6  ALBUMIN 3.3*  AST 14*  ALT 12  ALKPHOS 45  BILITOT 0.9   PT/INR No results for input(s): "LABPROT", "INR" in the last 72 hours.  Diagnostic imaging: CT ABDOMEN PELVIS WO CONTRAST  Result Date: 08/02/2022 CLINICAL DATA:  Abdominal pain, acute, nonlocalized EXAM: CT ABDOMEN AND PELVIS WITHOUT CONTRAST TECHNIQUE: Multidetector CT imaging of the abdomen and pelvis was performed following the standard protocol without IV contrast. RADIATION DOSE REDUCTION: This exam was performed according to the departmental dose-optimization program which includes automated exposure control, adjustment of the mA and/or kV according to patient size and/or use of iterative reconstruction technique. COMPARISON:  CT scan abdomen and pelvis from 08/07/2010. FINDINGS: Lower chest: There are patchy atelectatic changes in the visualized lung bases. No overt consolidation. No pleural effusion. The heart is normal in size. No pericardial effusion. Hepatobiliary: The liver is normal in size. Non-cirrhotic configuration. No suspicious intrahepatic mass. However, there are multiple new heterogeneous hypoattenuating lesions along  the liver capsule as well as adjacent peritoneal reflections, favored to represent tumor implants. Few of the lesions exhibit mild liver surface scalloping. No intrahepatic or extrahepatic bile duct dilation. Contracted gallbladder containing several noncalcified gallstones. Normal gallbladder wall thickness. No pericholecystic inflammatory changes. Pancreas: Unremarkable. No pancreatic ductal dilatation or surrounding inflammatory changes. Spleen: Within normal limits. No focal lesion. Adrenals/Urinary Tract: Adrenal glands are unremarkable. Small/atrophic bilateral native kidneys. There is a transplanted kidney in the right lower abdomen. No hydronephrosis. There is a subcentimeter sized hyperattenuating focus in the upper pole of the transplanted kidney, indeterminate but favored to represent a proteinaceous/hemorrhagic cyst. No hydronephrosis. No renal or ureteric calculi. Unremarkable urinary bladder. Stomach/Bowel: There is a probable small sliding hiatal hernia. There is marked circumferential thickening of the cecum/ascending colon measuring up to 2.1 cm, with extension into the terminal ileum. There is resultant dilation of distal ileal loops measuring up to 3.8 cm in diameter. The remaining distal colon is nondilated. There are multiple diverticula in the sigmoid colon without diverticulitis. Stomach and proximal small bowel loops are also nondilated. Vascular/Lymphatic: There is mild ascites mainly in the dependent pelvis and in the perihepatic/perisplenic region. There are multiple heterogeneous nodules throughout the upper abdomen, favoring peritoneal carcinomatosis. One such largest conglomerated mass in the right lower quadrant measures up to 4.8 x 6.4 cm on coronal plane (series 4, image 43). No aneurysmal dilation of the major abdominal arteries. There are moderate peripheral atherosclerotic vascular calcifications of the aorta and its major branches. Reproductive: The uterus is unremarkable. No  large adnexal mass. Other: The visualized soft tissues  and abdominal wall are unremarkable. Musculoskeletal: No suspicious osseous lesions. IMPRESSION: 1. Marked circumferential thickening of the cecum/ascending colon with extension into the terminal ileum, consistent with a primary colonic neoplasm. There is resultant dilation of distal ileal loops measuring up to 3.8 cm in diameter, suggesting partial small bowel obstruction. The remaining distal colon is nondilated. 2. Multiple heterogeneous nodules throughout the abdomen, favoring peritoneal carcinomatosis. 3. Multiple other nonacute observations, as described above. Aortic Atherosclerosis (ICD10-I70.0). Electronically Signed   By: Jules Schick M.D.   On: 08/02/2022 14:06    IMPRESSION: Circumferential thickening in the cecum/ascending colon concerning for primary colonic neoplasm CT imaging concerning for partial small bowel obstruction CT imaging concerning for possible peritoneal carcinomatosis Family history colon cancer, brother age 83 Unintentional weight loss Normocytic anemia Personal history renal transplant 2013 Hyperlipidemia  PLAN: -Recommend colonoscopy to further evaluate abnormal CT imaging, uncertain if patient would be able to tolerate full bowel preparation given her nausea and concern for partial small bowel obstruction on imaging, will attempt today -General surgery following -Discussed colonoscopy procedure with patient in detail, discussed benefits, alternatives and risks of bleeding/infection/perforation/missed lesion/anesthesia, she verbalized understanding and elected to proceed -Continue clear liquid diet -Bowel preparation this afternoon, n.p.o. at midnight for possible colonoscopy 08/04/2022 (depending on endoscopy schedule and anesthesia availability) -If unable to tolerate bowel preparation, may need surgical intervention -[facility] will follow   LOS: 1 day   Liliane Shi, DO [facility]astroenterology

## 2022-08-04 ENCOUNTER — Inpatient Hospital Stay (HOSPITAL_COMMUNITY): Payer: Medicare Other

## 2022-08-04 ENCOUNTER — Encounter (HOSPITAL_COMMUNITY): Payer: Self-pay

## 2022-08-04 DIAGNOSIS — K56600 Partial intestinal obstruction, unspecified as to cause: Secondary | ICD-10-CM | POA: Diagnosis not present

## 2022-08-04 DIAGNOSIS — Z940 Kidney transplant status: Secondary | ICD-10-CM | POA: Diagnosis not present

## 2022-08-04 DIAGNOSIS — K6389 Other specified diseases of intestine: Secondary | ICD-10-CM | POA: Diagnosis not present

## 2022-08-04 LAB — CBC WITH DIFFERENTIAL/PLATELET
Abs Immature Granulocytes: 0.02 10*3/uL (ref 0.00–0.07)
Basophils Absolute: 0 10*3/uL (ref 0.0–0.1)
Basophils Relative: 0 %
Eosinophils Absolute: 0 10*3/uL (ref 0.0–0.5)
Eosinophils Relative: 0 %
HCT: 35.5 % — ABNORMAL LOW (ref 36.0–46.0)
Hemoglobin: 11.1 g/dL — ABNORMAL LOW (ref 12.0–15.0)
Immature Granulocytes: 0 %
Lymphocytes Relative: 12 %
Lymphs Abs: 1.1 10*3/uL (ref 0.7–4.0)
MCH: 28.5 pg (ref 26.0–34.0)
MCHC: 31.3 g/dL (ref 30.0–36.0)
MCV: 91.3 fL (ref 80.0–100.0)
Monocytes Absolute: 0.9 10*3/uL (ref 0.1–1.0)
Monocytes Relative: 10 %
Neutro Abs: 7.5 10*3/uL (ref 1.7–7.7)
Neutrophils Relative %: 78 %
Platelets: 379 10*3/uL (ref 150–400)
RBC: 3.89 MIL/uL (ref 3.87–5.11)
RDW: 13.6 % (ref 11.5–15.5)
WBC: 9.7 10*3/uL (ref 4.0–10.5)
nRBC: 0 % (ref 0.0–0.2)

## 2022-08-04 LAB — BASIC METABOLIC PANEL
Anion gap: 14 (ref 5–15)
BUN: 12 mg/dL (ref 8–23)
CO2: 17 mmol/L — ABNORMAL LOW (ref 22–32)
Calcium: 8.5 mg/dL — ABNORMAL LOW (ref 8.9–10.3)
Chloride: 103 mmol/L (ref 98–111)
Creatinine, Ser: 0.7 mg/dL (ref 0.44–1.00)
GFR, Estimated: >60 mL/min (ref >60)
Glucose, Bld: 174 mg/dL — ABNORMAL HIGH (ref 70–99)
Potassium: 3.7 mmol/L (ref 3.5–5.1)
Sodium: 134 mmol/L — ABNORMAL LOW (ref 135–145)

## 2022-08-04 LAB — GLUCOSE, CAPILLARY
Glucose-Capillary: 117 mg/dL — ABNORMAL HIGH (ref 70–99)
Glucose-Capillary: 130 mg/dL — ABNORMAL HIGH (ref 70–99)
Glucose-Capillary: 160 mg/dL — ABNORMAL HIGH (ref 70–99)
Glucose-Capillary: 180 mg/dL — ABNORMAL HIGH (ref 70–99)
Glucose-Capillary: 227 mg/dL — ABNORMAL HIGH (ref 70–99)
Glucose-Capillary: 228 mg/dL — ABNORMAL HIGH (ref 70–99)

## 2022-08-04 LAB — MAGNESIUM: Magnesium: 2.1 mg/dL (ref 1.7–2.4)

## 2022-08-04 MED ORDER — POLYETHYLENE GLYCOL 3350 17 GM/SCOOP PO POWD
1.0000 | Freq: Once | ORAL | Status: AC
  Administered 2022-08-04: 255 g via ORAL
  Filled 2022-08-04: qty 255

## 2022-08-04 MED ORDER — IOHEXOL 300 MG/ML  SOLN
100.0000 mL | Freq: Once | INTRAMUSCULAR | Status: AC | PRN
  Administered 2022-08-04: 100 mL via INTRAVENOUS

## 2022-08-04 NOTE — Plan of Care (Signed)
  Problem: Activity: Goal: Risk for activity intolerance will decrease Outcome: Progressing   Problem: Coping: Goal: Level of anxiety will decrease Outcome: Progressing   

## 2022-08-04 NOTE — Progress Notes (Signed)
   08/04/22 0911  TOC Brief Assessment  Insurance and Status Reviewed  Patient has primary care physician Yes  Home environment has been reviewed Home w/ spouse  Prior level of function: Independent  Prior/Current Home Services No current home services  Social Determinants of Health Reivew SDOH reviewed no interventions necessary  Readmission risk has been reviewed Yes  Transition of care needs no transition of care needs at this time

## 2022-08-04 NOTE — Progress Notes (Signed)
Progress Note    [name]   XBM:[id number]  DOB: 11-29-1945  DOA: 08/02/2022     2 PCP: Mila Palmer, MD  Initial CC: abdominal pain, N/V  Hospital Course: [name] is a 77 yo female with PMH renal transplant (02/17/11), osteoarthritis, HLD, DM II who presented to the hospital with abdominal pain and nausea/vomiting. She endorsed that symptoms had began approximately 1 week prior to hospitalization but started to dramatically worsen 2 days ago.  She has had unintentional weight loss over the past 1 month approximately 15 pounds.  She is typically cold natured but endorses some chills recently.  She denied any fevers or night sweats.  CT abdomen/pelvis was performed in the ER.  This showed circumferential thickening of the cecum/ascending colon with extension into the terminal ileum concerning for colonic neoplasm.  Dilated distal ileal loops noted suggesting partial SBO. General surgery and GI were consulted.  Patient admitted for further workup.  Interval History:  Husband present as well today; she's not feeling the best; still nauseous and denies any flatus or BM. She's afraid to drink much of the colon prep but an alternative is being offered.  Again discussed with patient and husband the concern for obstruction and the etiology that this is a malignancy and that any surgery would not be curative but only help relieve the obstruction.   Assessment and Plan: * Partial small bowel obstruction (HCC) - presumed due to underlying colonic mass (measures 4.8 x 6.4 cm in RLQ) per CT - GI and general surgery following - NPO per surgery for now - continue IVF, nausea, and pain control - tentative plan for colonoscopy per GI/OR schedule and/or if patient able to tolerate colon prep adequately  - CT C/A/P performed 7/21 concerning for worsening obstruction especially as patient having more distension and no flatus/BM with "apple-core" appearance near lesion - for now, needs CVL and  TPN while next steps are figured out (e.g. surgery)  Ascending colonic mass - per CT: "largest conglomerated mass in the right lower quadrant measures up to 4.8 x 6.4 cm" - see repeat CT on 7/21 as well; concern is worsening obstruction in the clinical context - see pSBO above as well - will follow up further discussions with GI and surgery for next steps  History of kidney transplant - RLQ - 2013 - transplanted in 2013; DDKT - continue prograf (patient may use home supply) and mycophenolate - continue prednisone - continue MWF Bactrim for ppx  Normocytic anemia - mild; baseline Hgb 11-12 g/dL - currently at baseline   Hypokalemia - replete as needed  Essential hypertension - hold clonidine and other home meds for now; trying to ease pill burden - will at least resume amlodipine and increase dose for now - PRN Labetalol or hydralazine   Type 2 diabetes mellitus with complication (HCC) - hold basal insulin for now - q4h SSI - A1c 8.1%   Old records reviewed in assessment of this patient  Antimicrobials:   DVT prophylaxis:  enoxaparin (LOVENOX) injection 40 mg Start: 08/03/22 1000   Code Status:   Code Status: Full Code  Mobility Assessment (Last 72 Hours)     Mobility Assessment     Row Name 08/04/22 0752 08/03/22 1940 08/03/22 0734 08/02/22 2020 08/02/22 1606   Does patient have an order for bedrest or is patient medically unstable No - Continue assessment No - Continue assessment No - Continue assessment No - Continue assessment No - Continue assessment   What  is the highest level of mobility based on the progressive mobility assessment? Level 5 (Walks with assist in room/hall) - Balance while stepping forward/back and can walk in room with assist - Complete Level 5 (Walks with assist in room/hall) - Balance while stepping forward/back and can walk in room with assist - Complete Level 5 (Walks with assist in room/hall) - Balance while stepping forward/back and can  walk in room with assist - Complete Level 5 (Walks with assist in room/hall) - Balance while stepping forward/back and can walk in room with assist - Complete Level 5 (Walks with assist in room/hall) - Balance while stepping forward/back and can walk in room with assist - Complete           Mobility Assessment (Last 72 Hours)     Mobility Assessment     Row Name 08/04/22 0752 08/03/22 1940 08/03/22 0734 08/02/22 2020 08/02/22 1606   Does patient have an order for bedrest or is patient medically unstable No - Continue assessment No - Continue assessment No - Continue assessment No - Continue assessment No - Continue assessment   What is the highest level of mobility based on the progressive mobility assessment? Level 5 (Walks with assist in room/hall) - Balance while stepping forward/back and can walk in room with assist - Complete Level 5 (Walks with assist in room/hall) - Balance while stepping forward/back and can walk in room with assist - Complete Level 5 (Walks with assist in room/hall) - Balance while stepping forward/back and can walk in room with assist - Complete Level 5 (Walks with assist in room/hall) - Balance while stepping forward/back and can walk in room with assist - Complete Level 5 (Walks with assist in room/hall) - Balance while stepping forward/back and can walk in room with assist - Complete           Mobility Assessment (Last 72 Hours)     Mobility Assessment     Row Name 08/04/22 0752 08/03/22 1940 08/03/22 0734 08/02/22 2020 08/02/22 1606   Does patient have an order for bedrest or is patient medically unstable No - Continue assessment No - Continue assessment No - Continue assessment No - Continue assessment No - Continue assessment   What is the highest level of mobility based on the progressive mobility assessment? Level 5 (Walks with assist in room/hall) - Balance while stepping forward/back and can walk in room with assist - Complete Level 5 (Walks with assist  in room/hall) - Balance while stepping forward/back and can walk in room with assist - Complete Level 5 (Walks with assist in room/hall) - Balance while stepping forward/back and can walk in room with assist - Complete Level 5 (Walks with assist in room/hall) - Balance while stepping forward/back and can walk in room with assist - Complete Level 5 (Walks with assist in room/hall) - Balance while stepping forward/back and can walk in room with assist - Complete            Barriers to discharge: none Disposition Plan:  Home Status is: Inpt  Objective: Blood pressure (!) 154/75, pulse (!) 112, temperature 98.8 F (37.1 C), temperature source Oral, resp. rate 17, height 4\' 11"  (1.499 m), weight 47.6 kg, SpO2 93%.  Examination:  Physical Exam Constitutional:      General: She is not in acute distress.    Appearance: She is well-developed. She is not ill-appearing.  HENT:     Head: Normocephalic and atraumatic.     Mouth/Throat:  Mouth: Mucous membranes are moist.  Eyes:     Extraocular Movements: Extraocular movements intact.  Cardiovascular:     Rate and Rhythm: Normal rate and regular rhythm.  Pulmonary:     Effort: Pulmonary effort is normal. No respiratory distress.     Breath sounds: Normal breath sounds. No wheezing.  Abdominal:     General: There is distension (mild).     Palpations: Abdomen is soft.     Tenderness: There is abdominal tenderness (mild TTP nonspecific).     Comments: Bowel sounds still appreciated, do not appreciate high pitch yet  Musculoskeletal:        General: Normal range of motion.     Cervical back: Normal range of motion and neck supple.  Skin:    General: Skin is warm and dry.  Neurological:     General: No focal deficit present.     Mental Status: She is alert.  Psychiatric:        Mood and Affect: Mood normal.        Behavior: Behavior normal.      Consultants:  GI General surgery  Procedures:    Data Reviewed: Results for  orders placed or performed during the hospital encounter of 08/02/22 (from the past 24 hour(s))  Glucose, capillary     Status: Abnormal   Collection Time: 08/03/22  4:07 PM  Result Value Ref Range   Glucose-Capillary 122 (H) 70 - 99 mg/dL  Glucose, capillary     Status: Abnormal   Collection Time: 08/03/22  8:30 PM  Result Value Ref Range   Glucose-Capillary 180 (H) 70 - 99 mg/dL  Basic metabolic panel     Status: Abnormal   Collection Time: 08/04/22  3:34 AM  Result Value Ref Range   Sodium 134 (L) 135 - 145 mmol/L   Potassium 3.7 3.5 - 5.1 mmol/L   Chloride 103 98 - 111 mmol/L   CO2 17 (L) 22 - 32 mmol/L   Glucose, Bld 174 (H) 70 - 99 mg/dL   BUN 12 8 - 23 mg/dL   Creatinine, Ser 4.78 0.44 - 1.00 mg/dL   Calcium 8.5 (L) 8.9 - 10.3 mg/dL   GFR, Estimated >29 >56 mL/min   Anion gap 14 5 - 15  CBC with Differential/Platelet     Status: Abnormal   Collection Time: 08/04/22  3:34 AM  Result Value Ref Range   WBC 9.7 4.0 - 10.5 K/uL   RBC 3.89 3.87 - 5.11 MIL/uL   Hemoglobin 11.1 (L) 12.0 - 15.0 g/dL   HCT 21.3 (L) 08.6 - 57.8 %   MCV 91.3 80.0 - 100.0 fL   MCH 28.5 26.0 - 34.0 pg   MCHC 31.3 30.0 - 36.0 g/dL   RDW 46.9 62.9 - 52.8 %   Platelets 379 150 - 400 K/uL   nRBC 0.0 0.0 - 0.2 %   Neutrophils Relative % 78 %   Neutro Abs 7.5 1.7 - 7.7 K/uL   Lymphocytes Relative 12 %   Lymphs Abs 1.1 0.7 - 4.0 K/uL   Monocytes Relative 10 %   Monocytes Absolute 0.9 0.1 - 1.0 K/uL   Eosinophils Relative 0 %   Eosinophils Absolute 0.0 0.0 - 0.5 K/uL   Basophils Relative 0 %   Basophils Absolute 0.0 0.0 - 0.1 K/uL   Immature Granulocytes 0 %   Abs Immature Granulocytes 0.02 0.00 - 0.07 K/uL  Magnesium     Status: None   Collection Time: 08/04/22  3:34  AM  Result Value Ref Range   Magnesium 2.1 1.7 - 2.4 mg/dL  Glucose, capillary     Status: Abnormal   Collection Time: 08/04/22  4:19 AM  Result Value Ref Range   Glucose-Capillary 180 (H) 70 - 99 mg/dL  Glucose, capillary      Status: Abnormal   Collection Time: 08/04/22  7:40 AM  Result Value Ref Range   Glucose-Capillary 130 (H) 70 - 99 mg/dL  Glucose, capillary     Status: Abnormal   Collection Time: 08/04/22 12:23 PM  Result Value Ref Range   Glucose-Capillary 117 (H) 70 - 99 mg/dL    I have reviewed pertinent nursing notes, vitals, labs, and images as necessary. I have ordered labwork to follow up on as indicated.  I have reviewed the last notes from staff over past 24 hours. I have discussed patient's care plan and test results with nursing staff, CM/SW, and other staff as appropriate.  Time spent: Greater than 50% of the 55 minute visit was spent in counseling/coordination of care for the patient as laid out in the A&P.   LOS: 2 days   Lewie Chamber, MD [facility] 08/04/2022, 1:59 PM

## 2022-08-04 NOTE — Progress Notes (Signed)
Pt off unit with transport for scan.

## 2022-08-04 NOTE — Progress Notes (Signed)
[facility] Progress Note  SUBJECTIVE:   Interval history: [name] was seen and evaluated today at bedside. Spouse at bedside. Answered all questions to best of my ability, informed patient's spouse that concern is for colon cancer, though formal tissue diagnosis has not yet been completed. She noted having diffuse abdominal discomfort. She has had some nausea, though no vomiting. She had some heartburn, though no chest pain. No shortness of breath. She drank contrast yesterday for CT imaging. She was able to tolerate roughly 2 cups of bowel preparation. She has not yet passed flatus nor bowel movement since yesterday. Per patient spouse she has not had BM for 3-4 days. Discussed possibility of completing bowel preparation with NG tube, though patient declined this. She does not believe that she can tolerate completion of entire Trilyte, she may be able to tolerate Gatorade bowel prep.  Past Medical History:  Diagnosis Date   Diabetes mellitus type 2, controlled (HCC)    History of diverticulitis of colon 2016 & 2018 08/02/2022   History of kidney transplant February 25, 2011   [facility] -  ECD deceased donor kidney transplant on 25-Feb-2011   History of recurrent UTIs 08/02/2022   Hypertension    Renal insufficiency    Past Surgical History:  Procedure Laterality Date   KIDNEY TRANSPLANT  2011-02-25   ECD deceased donor kidney transplant at [facility]   PERITONEAL CATHETER INSERTION  06/20/2010   PERITONEAL CATHETER REMOVAL  08/09/2010   Current Facility-Administered Medications  Medication Dose Route Frequency Provider Last Rate Last Admin   0.9 %  sodium chloride infusion   Intravenous Continuous Lewie Chamber, MD 75 mL/hr at 08/02/22 1805 New Bag at 08/02/22 1805   acetaminophen (TYLENOL) tablet 650 mg  650 mg Oral Q6H PRN Lewie Chamber, MD   650 mg at 08/03/22 1712   Or   acetaminophen (TYLENOL) suppository 650 mg  650 mg Rectal Q6H PRN Lewie Chamber, MD        amLODipine (NORVASC) tablet 10 mg  10 mg Oral Daily Lewie Chamber, MD   10 mg at 08/04/22 0909   enoxaparin (LOVENOX) injection 40 mg  40 mg Subcutaneous Daily Lewie Chamber, MD   40 mg at 08/04/22 1610   hydrALAZINE (APRESOLINE) injection 10 mg  10 mg Intravenous Q4H PRN Lewie Chamber, MD       insulin aspart (novoLOG) injection 0-9 Units  0-9 Units Subcutaneous Q4H Lewie Chamber, MD   1 Units at 08/04/22 0915   labetalol (NORMODYNE) injection 10 mg  10 mg Intravenous Q4H PRN Lewie Chamber, MD       morphine (PF) 2 MG/ML injection 2 mg  2 mg Intravenous Q2H PRN Lewie Chamber, MD   2 mg at 08/04/22 9604   mycophenolate (MYFORTIC) EC tablet 180 mg  180 mg Oral BID Lewie Chamber, MD   180 mg at 08/04/22 0909   ondansetron (ZOFRAN) tablet 4 mg  4 mg Oral Q6H PRN Lewie Chamber, MD       Or   ondansetron [employer]) injection 4 mg  4 mg Intravenous Q6H PRN Lewie Chamber, MD   4 mg at 08/03/22 1935   pantoprazole (PROTONIX) injection 40 mg  40 mg Intravenous Daily Lewie Chamber, MD   40 mg at 08/04/22 5409   predniSONE (DELTASONE) tablet 5 mg  5 mg Oral Q breakfast Lewie Chamber, MD   5 mg at 08/04/22 0909   sodium chloride flush (NS) 0.9 % injection 3 mL  3 mL Intravenous Q12H Girguis,  Onalee Hua, MD   3 mL at 08/04/22 0916   [START ON 08/05/2022] sulfamethoxazole-trimethoprim (BACTRIM) 400-80 MG per tablet 1 tablet  1 tablet Oral Once per day on Monday Wednesday Friday Lewie Chamber, MD       tacrolimus (PROGRAF) capsule 3 mg  3 mg Oral QAC breakfast Lewie Chamber, MD   3 mg at 08/04/22 4098   And   tacrolimus (PROGRAF) capsule 2 mg  2 mg Oral Axel Filler, MD   2 mg at 08/03/22 2259   Allergies as of 08/02/2022 - Review Complete 08/02/2022  Allergen Reaction Noted   Amoxicillin-pot clavulanate Nausea And Vomiting 08/02/2022   Aspirin Nausea And Vomiting and Other (See Comments) 03/07/2011   Ciprofloxacin Other (See Comments) 08/02/2022   Fexofenadine Other (See Comments) 08/02/2022    Ibuprofen Nausea And Vomiting and Other (See Comments) 03/07/2011   Nitrofurantoin Nausea Only and Other (See Comments) 08/27/2018   Nitrofurantoin macrocrystal Nausea Only 01/23/2021   Simvastatin Other (See Comments) 08/02/2022   Review of Systems:  Review of Systems  Respiratory:  Negative for shortness of breath.   Cardiovascular:  Negative for chest pain.  Gastrointestinal:  Positive for abdominal pain, heartburn and nausea. Negative for constipation, diarrhea and vomiting.       No BM. No flatus.    OBJECTIVE:   Temp:  [97.7 F (36.5 C)-98.1 F (36.7 C)] 97.9 F (36.6 C) (07/21 0421) Pulse Rate:  [105] 105 (07/20 1324) Resp:  [16-18] 18 (07/21 0421) BP: (160-174)/(77-85) 161/82 (07/21 0421) SpO2:  [99 %-100 %] 99 % (07/21 0421) Last BM Date : 08/03/22 Physical Exam Constitutional:      General: She is not in acute distress.    Appearance: She is not ill-appearing, toxic-appearing or diaphoretic.  Cardiovascular:     Rate and Rhythm: Regular rhythm. Tachycardia present.  Pulmonary:     Effort: No respiratory distress.     Breath sounds: Normal breath sounds.  Abdominal:     General: Bowel sounds are normal. There is distension.     Palpations: Abdomen is soft.     Tenderness: There is abdominal tenderness. There is no guarding.  Neurological:     Mental Status: She is alert.     Labs: Recent Labs    08/02/22 1137 08/03/22 0315 08/04/22 0334  WBC 8.1 7.4 9.7  HGB 11.4* 11.3* 11.1*  HCT 35.2* 35.0* 35.5*  PLT 381 336 379   BMET Recent Labs    08/02/22 1137 08/03/22 0315 08/04/22 0334  NA 134* 137 134*  K 3.2* 3.2* 3.7  CL 100 106 103  CO2 25 21* 17*  GLUCOSE 229* 126* 174*  BUN 15 11 12   CREATININE 0.73 0.62 0.70  CALCIUM 8.6* 8.2* 8.5*   LFT Recent Labs    08/02/22 1137  PROT 6.6  ALBUMIN 3.3*  AST 14*  ALT 12  ALKPHOS 45  BILITOT 0.9   PT/INR No results for input(s): "LABPROT", "INR" in the last 72 hours. Diagnostic imaging: CT  CHEST ABDOMEN PELVIS W CONTRAST  Result Date: 08/04/2022 CLINICAL DATA:  Colon mass.  Staging. * Tracking Code: BO * EXAM: CT CHEST, ABDOMEN, AND PELVIS WITH CONTRAST TECHNIQUE: Multidetector CT imaging of the chest, abdomen and pelvis was performed following the standard protocol during bolus administration of intravenous contrast. RADIATION DOSE REDUCTION: This exam was performed according to the departmental dose-optimization program which includes automated exposure control, adjustment of the mA and/or kV according to patient size and/or use of iterative reconstruction technique.  CONTRAST:  OMNIPAQUE IOHEXOL 300 MG/ML  SOLN COMPARISON:  Abdomen and pelvis CT 08/02/2022 FINDINGS: CT CHEST FINDINGS Cardiovascular: The heart size is normal. No substantial pericardial effusion. Coronary artery calcification is evident. Moderate atherosclerotic calcification is noted in the wall of the thoracic aorta. Mediastinum/Nodes: 1.6 cm left thyroid nodule evident. No mediastinal lymphadenopathy. There is no hilar lymphadenopathy. The esophagus has normal imaging features. There is no axillary lymphadenopathy. Lungs/Pleura: 9 mm ground-glass opacity identified superior segment right lower lobe on image 41/7. Dependent atelectasis noted left lower lobe most small left pleural effusion. Musculoskeletal: No worrisome lytic or sclerotic osseous abnormality. CT ABDOMEN PELVIS FINDINGS Hepatobiliary: 13 mm capsular subcapsular lesion identified posterior right liver on 43/2. There is no evidence for gallstones, gallbladder wall thickening, or pericholecystic fluid. Noncalcified gallstones evident. No intrahepatic or extrahepatic biliary dilation. Pancreas: No focal mass lesion. No dilatation of the main duct. No intraparenchymal cyst. No peripancreatic edema. Spleen: No splenomegaly. No suspicious focal mass lesion. Adrenals/Urinary Tract: No adrenal nodule or mass. Native kidneys are markedly atrophic. Transplant kidney in  the right pelvis. Punctate nonobstructing stone identified upper pole transplant kidney. No hydronephrosis in the transplant kidney. Stomach/Bowel: Stomach is distended with fluid and contrast material. Duodenum is normally positioned as is the ligament of Treitz. Small bowel loops are opacified proximally. Distal small bowel is fluid-filled and dilated up to 3.4 cm diameter. Terminal ileum not well visualized. As on prior imaging there is a large irregular soft tissue mass involving the cecum and right colon. Lesion has an apple-core type configuration measuring on the order of 3.7 x 10.8 x 4.9 cm. Given the matting together of un opacified bowel loops an abnormal soft tissue, the margins of the right colon in the mass are not clearly defined. However there appears to be a mesenteric mass measuring on the order of 5.2 x 3.8 x 4.0 cm just medial to the ascending colon which could represent transmural tumor extension or metastatic lesion. 18 mm probable ileocolic lymph node seen on image 71/2 and coronal 56/5. The appendix is dilated and fluid-filled measuring up to 12 mm diameter. Given the lack of periappendiceal inflammation, appearance is probably related to obstruction at the base. Diverticular changes are noted in the left colon. Vascular/Lymphatic: There is moderate atherosclerotic calcification of the abdominal aorta without aneurysm. As above, there is abnormal soft tissue in the right mesentery with probable lymphadenopathy in the ileocolic mesentery. 9 mm omental nodule seen on image 61/2 with other scattered smaller omental nodules throughout. Multiple peritoneal nodules are seen in the high right upper abdomen adjacent to the liver (45/2) with index 10 mm peritoneal nodule seen posterior to the right liver on 52/2. Index 11 mm peritoneal nodule seen in the cul-de-sac on 98/2. Reproductive: Calcifications in the uterus likely related to fibroids. Neither ovary is discretely visible. Other: Small to  moderate volume ascites in the abdomen and pelvis. There is diffuse mesenteric edema/congestion. Musculoskeletal: No worrisome lytic or sclerotic osseous abnormality. Degenerative disc disease noted L1-2 and L5-S1. IMPRESSION: 1. Large irregular soft tissue mass involving the cecum and right colon with an apples core type configuration. Given the matting together of un opacified bowel loops and abnormal soft tissue, the margins of the right colon in the mass are not clearly defined. However, there appears to be a mesenteric mass measuring on the order of 5.2 x 3.8 x 4.0 cm just medial to the ascending colon which could represent transmural tumor extension or metastatic lesion. 2. 18 mm  ileocolic lymph node.  Consistent with metastatic disease 3. Multiple peritoneal nodules in the abdomen and pelvis consistent with metastatic disease to the peritoneum. 4. 13 mm capsular/subcapsular lesion posterior right liver. Imaging features compatible with metastatic disease. 5. Small to moderate volume ascites in the abdomen and pelvis with diffuse mesenteric edema/congestion. 6. No definite metastatic disease in the chest. There is a 9 mm ground-glass opacity superior segment right lower lobe. While not considered highly suspicious for metastatic disease follow-up warranted. Likely infectious/inflammatory etiology, primary lung adenocarcinoma could present similarly. 7. 1.6 cm left thyroid nodule. In the setting of significant comorbidities or limited life expectancy, no follow-up recommended (ref: J Am Coll Radiol. 2015 Feb;12(2): 143-50). 8. Cholelithiasis. 9. Transplant kidney in the right pelvis with punctate nonobstructing stone in the upper pole. 10.  Aortic Atherosclerosis (ICD10-I70.0). Electronically Signed   By: Kennith Center M.D.   On: 08/04/2022 05:40   CT ABDOMEN PELVIS WO CONTRAST  Result Date: 08/02/2022 CLINICAL DATA:  Abdominal pain, acute, nonlocalized EXAM: CT ABDOMEN AND PELVIS WITHOUT CONTRAST  TECHNIQUE: Multidetector CT imaging of the abdomen and pelvis was performed following the standard protocol without IV contrast. RADIATION DOSE REDUCTION: This exam was performed according to the departmental dose-optimization program which includes automated exposure control, adjustment of the mA and/or kV according to patient size and/or use of iterative reconstruction technique. COMPARISON:  CT scan abdomen and pelvis from 08/07/2010. FINDINGS: Lower chest: There are patchy atelectatic changes in the visualized lung bases. No overt consolidation. No pleural effusion. The heart is normal in size. No pericardial effusion. Hepatobiliary: The liver is normal in size. Non-cirrhotic configuration. No suspicious intrahepatic mass. However, there are multiple new heterogeneous hypoattenuating lesions along the liver capsule as well as adjacent peritoneal reflections, favored to represent tumor implants. Few of the lesions exhibit mild liver surface scalloping. No intrahepatic or extrahepatic bile duct dilation. Contracted gallbladder containing several noncalcified gallstones. Normal gallbladder wall thickness. No pericholecystic inflammatory changes. Pancreas: Unremarkable. No pancreatic ductal dilatation or surrounding inflammatory changes. Spleen: Within normal limits. No focal lesion. Adrenals/Urinary Tract: Adrenal glands are unremarkable. Small/atrophic bilateral native kidneys. There is a transplanted kidney in the right lower abdomen. No hydronephrosis. There is a subcentimeter sized hyperattenuating focus in the upper pole of the transplanted kidney, indeterminate but favored to represent a proteinaceous/hemorrhagic cyst. No hydronephrosis. No renal or ureteric calculi. Unremarkable urinary bladder. Stomach/Bowel: There is a probable small sliding hiatal hernia. There is marked circumferential thickening of the cecum/ascending colon measuring up to 2.1 cm, with extension into the terminal ileum. There is  resultant dilation of distal ileal loops measuring up to 3.8 cm in diameter. The remaining distal colon is nondilated. There are multiple diverticula in the sigmoid colon without diverticulitis. Stomach and proximal small bowel loops are also nondilated. Vascular/Lymphatic: There is mild ascites mainly in the dependent pelvis and in the perihepatic/perisplenic region. There are multiple heterogeneous nodules throughout the upper abdomen, favoring peritoneal carcinomatosis. One such largest conglomerated mass in the right lower quadrant measures up to 4.8 x 6.4 cm on coronal plane (series 4, image 43). No aneurysmal dilation of the major abdominal arteries. There are moderate peripheral atherosclerotic vascular calcifications of the aorta and its major branches. Reproductive: The uterus is unremarkable. No large adnexal mass. Other: The visualized soft tissues and abdominal wall are unremarkable. Musculoskeletal: No suspicious osseous lesions. IMPRESSION: 1. Marked circumferential thickening of the cecum/ascending colon with extension into the terminal ileum, consistent with a primary colonic neoplasm. There is resultant dilation  of distal ileal loops measuring up to 3.8 cm in diameter, suggesting partial small bowel obstruction. The remaining distal colon is nondilated. 2. Multiple heterogeneous nodules throughout the abdomen, favoring peritoneal carcinomatosis. 3. Multiple other nonacute observations, as described above. Aortic Atherosclerosis (ICD10-I70.0). Electronically Signed   By: Jules Schick M.D.   On: 08/02/2022 14:06    IMPRESSION: Circumferential thickening in the cecum/ascending colon concerning for primary colonic neoplasm  -Inability to tolerate complete bowel preparation overnight 7/20-7/21  -Declined NG tube for administration of bowel preparation   -Has not had BM or flatus overnight CT imaging concerning for partial small bowel obstruction CT imaging concerning for possible peritoneal  carcinomatosis Family history colon cancer, brother age 44 Unintentional weight loss Normocytic anemia Personal history renal transplant 2013 Hyperlipidemia  PLAN: -Concern for bowel obstruction given lack of BM, lack of flatus, abdominal distention, nausea, have communicated this to IM and General Surgery team  -Will check abdominal xray  -Colonoscopy planned for today cancelled, have rescheduled for tomorrow, though if completely obstructed would cancel colonoscopy in favor of surgical intervention -Patient agreeable to attempt Gatorade bowel prep, will order -NPO at midnight  [facility] will follow, Dr. Dulce Sellar tomorrow   LOS: 2 days   Liliane Shi, DO [facility]

## 2022-08-04 NOTE — Progress Notes (Signed)
BRIEF PROGRESS NOTE:   Results of abdominal xray noted, concern for ileus versus small bowel obstruction. Colonoscopy is not advisable in this setting, will cancel colonoscopy for tomorrow and keep NPO. IM and General Surgery aware. Would advise seeking definitive tissue diagnosis via other means including possible liver biopsy.   Liliane Shi, DO [facility]

## 2022-08-04 NOTE — Anesthesia Preprocedure Evaluation (Signed)
Anesthesia Evaluation    Reviewed: Allergy & Precautions, Patient's Chart, lab work & pertinent test results  Airway        Dental   Pulmonary neg pulmonary ROS          Cardiovascular hypertension, Pt. on medications      Neuro/Psych negative neurological ROS  negative psych ROS   GI/Hepatic Neg liver ROS,,,Partial vs complete SBO- new colon mass N/V, no flatus or BM   Endo/Other  diabetes, Well Controlled, Type 2, Oral Hypoglycemic Agents    Renal/GU Renal diseaseHx renal transplant 2013  negative genitourinary   Musculoskeletal negative musculoskeletal ROS (+)    Abdominal   Peds  Hematology  (+) Blood dyscrasia, anemia Hb 11.1, plt 379   Anesthesia Other Findings Trulicity LD:   Reproductive/Obstetrics negative OB ROS                             Anesthesia Physical Anesthesia Plan  ASA: 3  Anesthesia Plan: General   Post-op Pain Management:    Induction: Intravenous, Rapid sequence and Cricoid pressure planned  PONV Risk Score and Plan: 3 and Ondansetron and Treatment may vary due to age or medical condition  Airway Management Planned: Oral ETT  Additional Equipment: None  Intra-op Plan:   Post-operative Plan: Extubation in OR  Informed Consent:   Plan Discussed with:   Anesthesia Plan Comments:        Anesthesia Quick Evaluation

## 2022-08-04 NOTE — Progress Notes (Signed)
Subjective/Chief Complaint:  Still bloated and mildly nauseated GI is attempting a gentle bowel prep with colonoscopy scheduled for 08/05/22 AM Contrasted CT scan overnight shows large cecal/ R colon mass with a 5 cm mesenteric mass, metastatic lymph nodes, peritoneal nodules throughout the peritoneum, 13 mm liver metastases, ascites.  Objective: Vital signs in last 24 hours: Temp:  [97.7 F (36.5 C)-98.1 F (36.7 C)] 97.9 F (36.6 C) (07/21 0421) Pulse Rate:  [105] 105 (07/20 1324) Resp:  [16-18] 18 (07/21 0421) BP: (160-174)/(77-85) 161/82 (07/21 0421) SpO2:  [99 %-100 %] 99 % (07/21 0421) Last BM Date : 08/03/22  Intake/Output from previous day: 07/20 0701 - 07/21 0700 In: 1031.3 [I.V.:1031.3] Out: -  Intake/Output this shift: No intake/output data recorded.  Abd - soft, mildly distended; RUQ tenderness with palpable mass effect.  No peritonitis  Lab Results:  Recent Labs    08/03/22 0315 08/04/22 0334  WBC 7.4 9.7  HGB 11.3* 11.1*  HCT 35.0* 35.5*  PLT 336 379   BMET Recent Labs    08/03/22 0315 08/04/22 0334  NA 137 134*  K 3.2* 3.7  CL 106 103  CO2 21* 17*  GLUCOSE 126* 174*  BUN 11 12  CREATININE 0.62 0.70  CALCIUM 8.2* 8.5*   PT/INR No results for input(s): "LABPROT", "INR" in the last 72 hours. ABG No results for input(s): "PHART", "HCO3" in the last 72 hours.  Invalid input(s): "PCO2", "PO2"  Studies/Results: CT CHEST ABDOMEN PELVIS W CONTRAST  Result Date: 08/04/2022 CLINICAL DATA:  Colon mass.  Staging. * Tracking Code: BO * EXAM: CT CHEST, ABDOMEN, AND PELVIS WITH CONTRAST TECHNIQUE: Multidetector CT imaging of the chest, abdomen and pelvis was performed following the standard protocol during bolus administration of intravenous contrast. RADIATION DOSE REDUCTION: This exam was performed according to the departmental dose-optimization program which includes automated exposure control, adjustment of the mA and/or kV according to patient  size and/or use of iterative reconstruction technique. CONTRAST:  OMNIPAQUE IOHEXOL 300 MG/ML  SOLN COMPARISON:  Abdomen and pelvis CT 08/02/2022 FINDINGS: CT CHEST FINDINGS Cardiovascular: The heart size is normal. No substantial pericardial effusion. Coronary artery calcification is evident. Moderate atherosclerotic calcification is noted in the wall of the thoracic aorta. Mediastinum/Nodes: 1.6 cm left thyroid nodule evident. No mediastinal lymphadenopathy. There is no hilar lymphadenopathy. The esophagus has normal imaging features. There is no axillary lymphadenopathy. Lungs/Pleura: 9 mm ground-glass opacity identified superior segment right lower lobe on image 41/7. Dependent atelectasis noted left lower lobe most small left pleural effusion. Musculoskeletal: No worrisome lytic or sclerotic osseous abnormality. CT ABDOMEN PELVIS FINDINGS Hepatobiliary: 13 mm capsular subcapsular lesion identified posterior right liver on 43/2. There is no evidence for gallstones, gallbladder wall thickening, or pericholecystic fluid. Noncalcified gallstones evident. No intrahepatic or extrahepatic biliary dilation. Pancreas: No focal mass lesion. No dilatation of the main duct. No intraparenchymal cyst. No peripancreatic edema. Spleen: No splenomegaly. No suspicious focal mass lesion. Adrenals/Urinary Tract: No adrenal nodule or mass. Native kidneys are markedly atrophic. Transplant kidney in the right pelvis. Punctate nonobstructing stone identified upper pole transplant kidney. No hydronephrosis in the transplant kidney. Stomach/Bowel: Stomach is distended with fluid and contrast material. Duodenum is normally positioned as is the ligament of Treitz. Small bowel loops are opacified proximally. Distal small bowel is fluid-filled and dilated up to 3.4 cm diameter. Terminal ileum not well visualized. As on prior imaging there is a large irregular soft tissue mass involving the cecum and right colon. Lesion has an  apple-core type configuration measuring on the order of 3.7 x 10.8 x 4.9 cm. Given the matting together of un opacified bowel loops an abnormal soft tissue, the margins of the right colon in the mass are not clearly defined. However there appears to be a mesenteric mass measuring on the order of 5.2 x 3.8 x 4.0 cm just medial to the ascending colon which could represent transmural tumor extension or metastatic lesion. 18 mm probable ileocolic lymph node seen on image 71/2 and coronal 56/5. The appendix is dilated and fluid-filled measuring up to 12 mm diameter. Given the lack of periappendiceal inflammation, appearance is probably related to obstruction at the base. Diverticular changes are noted in the left colon. Vascular/Lymphatic: There is moderate atherosclerotic calcification of the abdominal aorta without aneurysm. As above, there is abnormal soft tissue in the right mesentery with probable lymphadenopathy in the ileocolic mesentery. 9 mm omental nodule seen on image 61/2 with other scattered smaller omental nodules throughout. Multiple peritoneal nodules are seen in the high right upper abdomen adjacent to the liver (45/2) with index 10 mm peritoneal nodule seen posterior to the right liver on 52/2. Index 11 mm peritoneal nodule seen in the cul-de-sac on 98/2. Reproductive: Calcifications in the uterus likely related to fibroids. Neither ovary is discretely visible. Other: Small to moderate volume ascites in the abdomen and pelvis. There is diffuse mesenteric edema/congestion. Musculoskeletal: No worrisome lytic or sclerotic osseous abnormality. Degenerative disc disease noted L1-2 and L5-S1. IMPRESSION: 1. Large irregular soft tissue mass involving the cecum and right colon with an apples core type configuration. Given the matting together of un opacified bowel loops and abnormal soft tissue, the margins of the right colon in the mass are not clearly defined. However, there appears to be a mesenteric mass  measuring on the order of 5.2 x 3.8 x 4.0 cm just medial to the ascending colon which could represent transmural tumor extension or metastatic lesion. 2. 18 mm  ileocolic lymph node.  Consistent with metastatic disease 3. Multiple peritoneal nodules in the abdomen and pelvis consistent with metastatic disease to the peritoneum. 4. 13 mm capsular/subcapsular lesion posterior right liver. Imaging features compatible with metastatic disease. 5. Small to moderate volume ascites in the abdomen and pelvis with diffuse mesenteric edema/congestion. 6. No definite metastatic disease in the chest. There is a 9 mm ground-glass opacity superior segment right lower lobe. While not considered highly suspicious for metastatic disease follow-up warranted. Likely infectious/inflammatory etiology, primary lung adenocarcinoma could present similarly. 7. 1.6 cm left thyroid nodule. In the setting of significant comorbidities or limited life expectancy, no follow-up recommended (ref: J Am Coll Radiol. 2015 Feb;12(2): 143-50). 8. Cholelithiasis. 9. Transplant kidney in the right pelvis with punctate nonobstructing stone in the upper pole. 10.  Aortic Atherosclerosis (ICD10-I70.0). Electronically Signed   By: Kennith Center M.D.   On: 08/04/2022 05:40   CT ABDOMEN PELVIS WO CONTRAST  Result Date: 08/02/2022 CLINICAL DATA:  Abdominal pain, acute, nonlocalized EXAM: CT ABDOMEN AND PELVIS WITHOUT CONTRAST TECHNIQUE: Multidetector CT imaging of the abdomen and pelvis was performed following the standard protocol without IV contrast. RADIATION DOSE REDUCTION: This exam was performed according to the departmental dose-optimization program which includes automated exposure control, adjustment of the mA and/or kV according to patient size and/or use of iterative reconstruction technique. COMPARISON:  CT scan abdomen and pelvis from 08/07/2010. FINDINGS: Lower chest: There are patchy atelectatic changes in the visualized lung bases. No overt  consolidation. No pleural effusion. The heart  is normal in size. No pericardial effusion. Hepatobiliary: The liver is normal in size. Non-cirrhotic configuration. No suspicious intrahepatic mass. However, there are multiple new heterogeneous hypoattenuating lesions along the liver capsule as well as adjacent peritoneal reflections, favored to represent tumor implants. Few of the lesions exhibit mild liver surface scalloping. No intrahepatic or extrahepatic bile duct dilation. Contracted gallbladder containing several noncalcified gallstones. Normal gallbladder wall thickness. No pericholecystic inflammatory changes. Pancreas: Unremarkable. No pancreatic ductal dilatation or surrounding inflammatory changes. Spleen: Within normal limits. No focal lesion. Adrenals/Urinary Tract: Adrenal glands are unremarkable. Small/atrophic bilateral native kidneys. There is a transplanted kidney in the right lower abdomen. No hydronephrosis. There is a subcentimeter sized hyperattenuating focus in the upper pole of the transplanted kidney, indeterminate but favored to represent a proteinaceous/hemorrhagic cyst. No hydronephrosis. No renal or ureteric calculi. Unremarkable urinary bladder. Stomach/Bowel: There is a probable small sliding hiatal hernia. There is marked circumferential thickening of the cecum/ascending colon measuring up to 2.1 cm, with extension into the terminal ileum. There is resultant dilation of distal ileal loops measuring up to 3.8 cm in diameter. The remaining distal colon is nondilated. There are multiple diverticula in the sigmoid colon without diverticulitis. Stomach and proximal small bowel loops are also nondilated. Vascular/Lymphatic: There is mild ascites mainly in the dependent pelvis and in the perihepatic/perisplenic region. There are multiple heterogeneous nodules throughout the upper abdomen, favoring peritoneal carcinomatosis. One such largest conglomerated mass in the right lower quadrant  measures up to 4.8 x 6.4 cm on coronal plane (series 4, image 43). No aneurysmal dilation of the major abdominal arteries. There are moderate peripheral atherosclerotic vascular calcifications of the aorta and its major branches. Reproductive: The uterus is unremarkable. No large adnexal mass. Other: The visualized soft tissues and abdominal wall are unremarkable. Musculoskeletal: No suspicious osseous lesions. IMPRESSION: 1. Marked circumferential thickening of the cecum/ascending colon with extension into the terminal ileum, consistent with a primary colonic neoplasm. There is resultant dilation of distal ileal loops measuring up to 3.8 cm in diameter, suggesting partial small bowel obstruction. The remaining distal colon is nondilated. 2. Multiple heterogeneous nodules throughout the abdomen, favoring peritoneal carcinomatosis. 3. Multiple other nonacute observations, as described above. Aortic Atherosclerosis (ICD10-I70.0). Electronically Signed   By: Jules Schick M.D.   On: 08/02/2022 14:06    Anti-infectives: Anti-infectives (From admission, onward)    Start     Dose/Rate Route Frequency Ordered Stop   08/05/22 1000  sulfamethoxazole-trimethoprim (BACTRIM) 400-80 MG per tablet 1 tablet        1 tablet Oral Once per day on Monday Wednesday Friday 08/02/22 1602         Assessment/Plan: Partial small bowel obstruction Cecal/ ascending colon mass - likely adenocarcinoma with evidence of peritoneal carcinomatosis   Recs: CEA level (ordered) GI consult for colonoscopy for biopsies to establish definitive diagnosis. Staging CT scan of Chest/ abdomen/ pelvis with contrast performed early this morning   Oncology evaluation once diagnosis established.  May need referral to [facility] due to renal transplant/ peritoneal carcinomatosis.   Patient declines NG tube for now, but I explained to her that she will need it if she becomes more distended or begins vomiting.  LOS: 2 days    Wynona Luna 08/04/2022

## 2022-08-04 NOTE — Plan of Care (Signed)
  Problem: Education: Goal: Ability to describe self-care measures that may prevent or decrease complications (Diabetes Survival Skills Education) will improve Outcome: Progressing   Problem: Education: Goal: Knowledge of General Education information will improve Description: Including pain rating scale, medication(s)/side effects and non-pharmacologic comfort measures Outcome: Progressing   Problem: Activity: Goal: Risk for activity intolerance will decrease Outcome: Progressing   

## 2022-08-04 NOTE — Consult Note (Signed)
Chief Complaint: Patient was seen in consultation today for tunneled central catheter placement  Referring Physician(s): Lewie Chamber, MD  Supervising Physician: Malachy Moan  Patient Status: [facility] - In-pt  History of Present Illness: [name] is a 77 y.o. female with PMH significant for type 2 diabetes mellitus, history of kidney transplant in 2013, hypertension, and renal insufficiency being seen today in relation to a request for image-guided central catheter placement. Patient initially presented to [facility] on 08/02/22 with abdominal pain and vomiting. CT AP performed 7/19 and CT CAP 08/04/22 revealed concern for colonic neoplasm with further concern for metastatic disease due to multiple peritoneal nodules in the abdomen and pelvis. IR has been consulted for image-guided central catheter placement.  Past Medical History:  Diagnosis Date   Diabetes mellitus type 2, controlled (HCC)    History of diverticulitis of colon 2016 & 2018 08/02/2022   History of kidney transplant 03-15-11   [facility] -  [facility] deceased donor kidney transplant on Mar 15, 2011   History of recurrent UTIs 08/02/2022   Hypertension    Renal insufficiency     Past Surgical History:  Procedure Laterality Date   KIDNEY TRANSPLANT  March 15, 2011   [facility] deceased donor kidney transplant at [facility]   PERITONEAL CATHETER INSERTION  06/20/2010   PERITONEAL CATHETER REMOVAL  08/09/2010    Allergies: Amoxicillin-pot clavulanate, Aspirin, Ciprofloxacin, Fexofenadine, Ibuprofen, Nitrofurantoin, Nitrofurantoin macrocrystal, and Simvastatin  Medications: Prior to Admission medications   Medication Sig Start Date End Date Taking? Authorizing Provider  amLODipine (NORVASC) 5 MG tablet Take 5 mg by mouth daily.   Yes [provider]  atorvastatin (LIPITOR) 10 MG tablet Take 10 mg by mouth See admin instructions. Take 10 mg by mouth two to three times a week   Yes [provider]  BIOTIN  PO Take 1 tablet by mouth daily with breakfast.   Yes [provider]  Cholecalciferol (VITAMIN D3) 50 MCG (2000 UT) TABS Take 2,000 Units by mouth daily with breakfast.   Yes [provider]  cloNIDine (CATAPRES) 0.1 MG tablet Take 0.1 mg by mouth 2 (two) times daily.   Yes [provider]  magnesium oxide (MAG-OX) 400 MG tablet Take 400 mg by mouth daily.   Yes [provider]  metFORMIN (GLUCOPHAGE-XR) 500 MG 24 hr tablet Take 500 mg by mouth 2 (two) times daily.   Yes [provider]  mycophenolate (MYFORTIC) 180 MG EC tablet Take 180 mg by mouth 2 (two) times daily.   Yes [provider]  potassium chloride SA (K-DUR,KLOR-CON) 20 MEQ tablet Take 20 mEq by mouth daily.   Yes [provider]  predniSONE (DELTASONE) 5 MG tablet Take 5 mg by mouth daily with breakfast.   Yes [provider]  sulfamethoxazole-trimethoprim (BACTRIM) 400-80 MG tablet Take 1 tablet by mouth every Monday, Wednesday, and Friday.   Yes [provider]  tacrolimus (PROGRAF) 1 MG capsule Take 2-3 mg by mouth 2 (two) times daily. Take 3mg  daily in the morning and 2mg  daily at night.   Yes [provider]  TYLENOL 500 MG tablet Take 500-1,000 mg by mouth every 6 (six) hours as needed (for headaches or pain).   Yes [provider]  clotrimazole (GYNE-LOTRIMIN) 1 % vaginal cream Place 1 Applicatorful vaginally at bedtime. Patient not taking: Reported on 08/02/2022 03/01/22   Wallis Bamberg, PA-C  Dulaglutide (TRULICITY) 0.75 MG/0.5ML SOPN Inject 0.75 mg into the skin every Sunday. Patient not taking: Reported on 08/02/2022  [provider]  fluticasone (FLONASE) 50 MCG/ACT nasal spray Place 2 sprays into the nose daily. Patient not taking: Reported on 08/02/2022 01/13/11 08/02/22  Fayrene Helper, PA-C     Family History  Problem Relation Age of Onset   Healthy Mother    Cancer Father     Social History   Socioeconomic  History   Marital status: Married    Spouse name: Not on file   Number of children: Not on file   Years of education: Not on file   Highest education level: Not on file  Occupational History   Not on file  Tobacco Use   Smoking status: Never   Smokeless tobacco: Never  Vaping Use   Vaping status: Never Used  Substance and Sexual Activity   Alcohol use: No   Drug use: No   Sexual activity: Not on file  Other Topics Concern   Not on file  Social History Narrative   Pt is right handed   Lives in single story home with her husband   Has 2 adult children   High school graduate   Retired Agricultural consultant for [employer]   Social Determinants of Health   Financial Resource Strain: Not on file  Food Insecurity: No Food Insecurity (08/02/2022)   Hunger Vital Sign    Worried About Running Out of Food in the Last Year: Never true    Ran Out of Food in the Last Year: Never true  Transportation Needs: No Transportation Needs (08/02/2022)   PRAPARE - Administrator, Civil Service (Medical): No    Lack of Transportation (Non-Medical): No  Physical Activity: Not on file  Stress: Not on file  Social Connections: Not on file    Code Status: Full code  Review of Systems: A 12 point ROS discussed and pertinent positives are indicated in the HPI above.  All other systems are negative.  Review of Systems  Constitutional:  Positive for chills. Negative for fever.  Respiratory:  Negative for chest tightness and shortness of breath.   Cardiovascular:  Negative for chest pain and leg swelling.  Gastrointestinal:  Positive for abdominal pain, nausea and vomiting. Negative for diarrhea.  Neurological:  Negative for dizziness and headaches.  Psychiatric/Behavioral:  Negative for confusion.     Vital Signs: BP (!) 161/82 (BP Location: Right Arm)   Pulse (!) 105   Temp 97.9 F (36.6 C)   Resp 18   Ht 4\' 11"  (1.499 m)   Wt 105 lb (47.6 kg)   SpO2 99%   BMI 21.21 kg/m    Physical  Exam Vitals reviewed.  Constitutional:      General: She is not in acute distress.    Appearance: She is ill-appearing.  Cardiovascular:     Rate and Rhythm: Regular rhythm. Tachycardia present.     Pulses: Normal pulses.     Heart sounds: Normal heart sounds.  Pulmonary:     Effort: Pulmonary effort is normal.     Breath sounds: Normal breath sounds.  Abdominal:     Palpations: Abdomen is soft.     Tenderness: There is abdominal tenderness. There is no rebound.  Musculoskeletal:     Right lower leg: No edema.     Left lower leg: No edema.  Skin:    General: Skin is warm and dry.  Neurological:     Mental Status: She is alert and oriented to person, place, and time.  Psychiatric:  Mood and Affect: Mood normal.        Behavior: Behavior normal.        Thought Content: Thought content normal.        Judgment: Judgment normal.     Imaging: CT CHEST ABDOMEN PELVIS W CONTRAST  Result Date: 08/04/2022 CLINICAL DATA:  Colon mass.  Staging. * Tracking Code: BO * EXAM: CT CHEST, ABDOMEN, AND PELVIS WITH CONTRAST TECHNIQUE: Multidetector CT imaging of the chest, abdomen and pelvis was performed following the standard protocol during bolus administration of intravenous contrast. RADIATION DOSE REDUCTION: This exam was performed according to the departmental dose-optimization program which includes automated exposure control, adjustment of the mA and/or kV according to patient size and/or use of iterative reconstruction technique. CONTRAST:  OMNIPAQUE IOHEXOL 300 MG/ML  SOLN COMPARISON:  Abdomen and pelvis CT 08/02/2022 FINDINGS: CT CHEST FINDINGS Cardiovascular: The heart size is normal. No substantial pericardial effusion. Coronary artery calcification is evident. Moderate atherosclerotic calcification is noted in the wall of the thoracic aorta. Mediastinum/Nodes: 1.6 cm left thyroid nodule evident. No mediastinal lymphadenopathy. There is no hilar lymphadenopathy. The esophagus  has normal imaging features. There is no axillary lymphadenopathy. Lungs/Pleura: 9 mm ground-glass opacity identified superior segment right lower lobe on image 41/7. Dependent atelectasis noted left lower lobe most small left pleural effusion. Musculoskeletal: No worrisome lytic or sclerotic osseous abnormality. CT ABDOMEN PELVIS FINDINGS Hepatobiliary: 13 mm capsular subcapsular lesion identified posterior right liver on 43/2. There is no evidence for gallstones, gallbladder wall thickening, or pericholecystic fluid. Noncalcified gallstones evident. No intrahepatic or extrahepatic biliary dilation. Pancreas: No focal mass lesion. No dilatation of the main duct. No intraparenchymal cyst. No peripancreatic edema. Spleen: No splenomegaly. No suspicious focal mass lesion. Adrenals/Urinary Tract: No adrenal nodule or mass. Native kidneys are markedly atrophic. Transplant kidney in the right pelvis. Punctate nonobstructing stone identified upper pole transplant kidney. No hydronephrosis in the transplant kidney. Stomach/Bowel: Stomach is distended with fluid and contrast material. Duodenum is normally positioned as is the ligament of Treitz. Small bowel loops are opacified proximally. Distal small bowel is fluid-filled and dilated up to 3.4 cm diameter. Terminal ileum not well visualized. As on prior imaging there is a large irregular soft tissue mass involving the cecum and right colon. Lesion has an apple-core type configuration measuring on the order of 3.7 x 10.8 x 4.9 cm. Given the matting together of un opacified bowel loops an abnormal soft tissue, the margins of the right colon in the mass are not clearly defined. However there appears to be a mesenteric mass measuring on the order of 5.2 x 3.8 x 4.0 cm just medial to the ascending colon which could represent transmural tumor extension or metastatic lesion. 18 mm probable ileocolic lymph node seen on image 71/2 and coronal 56/5. The appendix is dilated and  fluid-filled measuring up to 12 mm diameter. Given the lack of periappendiceal inflammation, appearance is probably related to obstruction at the base. Diverticular changes are noted in the left colon. Vascular/Lymphatic: There is moderate atherosclerotic calcification of the abdominal aorta without aneurysm. As above, there is abnormal soft tissue in the right mesentery with probable lymphadenopathy in the ileocolic mesentery. 9 mm omental nodule seen on image 61/2 with other scattered smaller omental nodules throughout. Multiple peritoneal nodules are seen in the high right upper abdomen adjacent to the liver (45/2) with index 10 mm peritoneal nodule seen posterior to the right liver on 52/2. Index 11 mm peritoneal nodule seen in the cul-de-sac on  98/2. Reproductive: Calcifications in the uterus likely related to fibroids. Neither ovary is discretely visible. Other: Small to moderate volume ascites in the abdomen and pelvis. There is diffuse mesenteric edema/congestion. Musculoskeletal: No worrisome lytic or sclerotic osseous abnormality. Degenerative disc disease noted L1-2 and L5-S1. IMPRESSION: 1. Large irregular soft tissue mass involving the cecum and right colon with an apples core type configuration. Given the matting together of un opacified bowel loops and abnormal soft tissue, the margins of the right colon in the mass are not clearly defined. However, there appears to be a mesenteric mass measuring on the order of 5.2 x 3.8 x 4.0 cm just medial to the ascending colon which could represent transmural tumor extension or metastatic lesion. 2. 18 mm  ileocolic lymph node.  Consistent with metastatic disease 3. Multiple peritoneal nodules in the abdomen and pelvis consistent with metastatic disease to the peritoneum. 4. 13 mm capsular/subcapsular lesion posterior right liver. Imaging features compatible with metastatic disease. 5. Small to moderate volume ascites in the abdomen and pelvis with diffuse  mesenteric edema/congestion. 6. No definite metastatic disease in the chest. There is a 9 mm ground-glass opacity superior segment right lower lobe. While not considered highly suspicious for metastatic disease follow-up warranted. Likely infectious/inflammatory etiology, primary lung adenocarcinoma could present similarly. 7. 1.6 cm left thyroid nodule. In the setting of significant comorbidities or limited life expectancy, no follow-up recommended (ref: J Am Coll Radiol. 2015 Feb;12(2): 143-50). 8. Cholelithiasis. 9. Transplant kidney in the right pelvis with punctate nonobstructing stone in the upper pole. 10.  Aortic Atherosclerosis (ICD10-I70.0). Electronically Signed   By: Kennith Center M.D.   On: 08/04/2022 05:40   CT ABDOMEN PELVIS WO CONTRAST  Result Date: 08/02/2022 CLINICAL DATA:  Abdominal pain, acute, nonlocalized EXAM: CT ABDOMEN AND PELVIS WITHOUT CONTRAST TECHNIQUE: Multidetector CT imaging of the abdomen and pelvis was performed following the standard protocol without IV contrast. RADIATION DOSE REDUCTION: This exam was performed according to the departmental dose-optimization program which includes automated exposure control, adjustment of the mA and/or kV according to patient size and/or use of iterative reconstruction technique. COMPARISON:  CT scan abdomen and pelvis from 08/07/2010. FINDINGS: Lower chest: There are patchy atelectatic changes in the visualized lung bases. No overt consolidation. No pleural effusion. The heart is normal in size. No pericardial effusion. Hepatobiliary: The liver is normal in size. Non-cirrhotic configuration. No suspicious intrahepatic mass. However, there are multiple new heterogeneous hypoattenuating lesions along the liver capsule as well as adjacent peritoneal reflections, favored to represent tumor implants. Few of the lesions exhibit mild liver surface scalloping. No intrahepatic or extrahepatic bile duct dilation. Contracted gallbladder containing  several noncalcified gallstones. Normal gallbladder wall thickness. No pericholecystic inflammatory changes. Pancreas: Unremarkable. No pancreatic ductal dilatation or surrounding inflammatory changes. Spleen: Within normal limits. No focal lesion. Adrenals/Urinary Tract: Adrenal glands are unremarkable. Small/atrophic bilateral native kidneys. There is a transplanted kidney in the right lower abdomen. No hydronephrosis. There is a subcentimeter sized hyperattenuating focus in the upper pole of the transplanted kidney, indeterminate but favored to represent a proteinaceous/hemorrhagic cyst. No hydronephrosis. No renal or ureteric calculi. Unremarkable urinary bladder. Stomach/Bowel: There is a probable small sliding hiatal hernia. There is marked circumferential thickening of the cecum/ascending colon measuring up to 2.1 cm, with extension into the terminal ileum. There is resultant dilation of distal ileal loops measuring up to 3.8 cm in diameter. The remaining distal colon is nondilated. There are multiple diverticula in the sigmoid colon without diverticulitis. Stomach and  proximal small bowel loops are also nondilated. Vascular/Lymphatic: There is mild ascites mainly in the dependent pelvis and in the perihepatic/perisplenic region. There are multiple heterogeneous nodules throughout the upper abdomen, favoring peritoneal carcinomatosis. One such largest conglomerated mass in the right lower quadrant measures up to 4.8 x 6.4 cm on coronal plane (series 4, image 43). No aneurysmal dilation of the major abdominal arteries. There are moderate peripheral atherosclerotic vascular calcifications of the aorta and its major branches. Reproductive: The uterus is unremarkable. No large adnexal mass. Other: The visualized soft tissues and abdominal wall are unremarkable. Musculoskeletal: No suspicious osseous lesions. IMPRESSION: 1. Marked circumferential thickening of the cecum/ascending colon with extension into the  terminal ileum, consistent with a primary colonic neoplasm. There is resultant dilation of distal ileal loops measuring up to 3.8 cm in diameter, suggesting partial small bowel obstruction. The remaining distal colon is nondilated. 2. Multiple heterogeneous nodules throughout the abdomen, favoring peritoneal carcinomatosis. 3. Multiple other nonacute observations, as described above. Aortic Atherosclerosis (ICD10-I70.0). Electronically Signed   By: Jules Schick M.D.   On: 08/02/2022 14:06    Labs:  CBC: Recent Labs    08/02/22 1137 08/03/22 0315 08/04/22 0334  WBC 8.1 7.4 9.7  HGB 11.4* 11.3* 11.1*  HCT 35.2* 35.0* 35.5*  PLT 381 336 379    COAGS: No results for input(s): "INR", "APTT" in the last 8760 hours.  BMP: Recent Labs    08/02/22 1137 08/03/22 0315 08/04/22 0334  NA 134* 137 134*  K 3.2* 3.2* 3.7  CL 100 106 103  CO2 25 21* 17*  GLUCOSE 229* 126* 174*  BUN 15 11 12   CALCIUM 8.6* 8.2* 8.5*  CREATININE 0.73 0.62 0.70  GFRNONAA >60 >60 >60    LIVER FUNCTION TESTS: Recent Labs    08/02/22 1137  BILITOT 0.9  AST 14*  ALT 12  ALKPHOS 45  PROT 6.6  ALBUMIN 3.3*    TUMOR MARKERS: No results for input(s): "AFPTM", "CEA", "CA199", "CHROMGRNA" in the last 8760 hours.  Assessment and Plan:  [name] is a 77 yo female being seen today in relation to request for tunneled central catheter request ([employer] specifically requested). The patient has suspected malignancy and Dr Frederick Peers from primary team has requested a tunneled central line to be placed as he anticipates the patient will need prolonged central venous access both during her inpatient stay and as an outpatient. Case tentatively scheduled for Monday, 08/05/22. Patient is currently NPO and expected to remain so due to a suspected small bowel obstruction.  Risks and benefits discussed with the patient including, but not limited to bleeding, infection, vascular injury, pneumothorax which may require  chest tube placement, air embolism or even death  All of the patient's questions were answered, patient is agreeable to proceed. Consent signed and in chart.   Thank you for this interesting consult.  I greatly enjoyed meeting [name] and look forward to participating in their care.  A copy of this report was sent to the requesting provider on this date.  Electronically Signed: Kennieth Francois, PA-C 08/04/2022, 9:12 AM   I spent a total of 20 Minutes in face to face in clinical consultation, greater than 50% of which was counseling/coordinating care for image-guided tunneled central catheter placement.

## 2022-08-04 NOTE — Progress Notes (Signed)
Pt returned from scan and back in her romm

## 2022-08-05 ENCOUNTER — Inpatient Hospital Stay (HOSPITAL_COMMUNITY): Payer: Medicare Other

## 2022-08-05 ENCOUNTER — Encounter (HOSPITAL_COMMUNITY)
Admission: EM | Disposition: A | Discharge status: Other Acute Inpatient Hospital | Payer: Self-pay | Source: Home / Self Care | Attending: Internal Medicine

## 2022-08-05 DIAGNOSIS — Z6822 Body mass index (BMI) 22.0-22.9, adult: Secondary | ICD-10-CM | POA: Diagnosis not present

## 2022-08-05 DIAGNOSIS — C772 Secondary and unspecified malignant neoplasm of intra-abdominal lymph nodes: Secondary | ICD-10-CM | POA: Diagnosis not present

## 2022-08-05 DIAGNOSIS — Z833 Family history of diabetes mellitus: Secondary | ICD-10-CM | POA: Diagnosis not present

## 2022-08-05 DIAGNOSIS — K5651 Intestinal adhesions [bands], with partial obstruction: Secondary | ICD-10-CM | POA: Diagnosis not present

## 2022-08-05 DIAGNOSIS — E1122 Type 2 diabetes mellitus with diabetic chronic kidney disease: Secondary | ICD-10-CM | POA: Diagnosis not present

## 2022-08-05 DIAGNOSIS — I4891 Unspecified atrial fibrillation: Secondary | ICD-10-CM | POA: Diagnosis not present

## 2022-08-05 DIAGNOSIS — K56600 Partial intestinal obstruction, unspecified as to cause: Secondary | ICD-10-CM

## 2022-08-05 DIAGNOSIS — K6389 Other specified diseases of intestine: Secondary | ICD-10-CM | POA: Diagnosis not present

## 2022-08-05 DIAGNOSIS — E162 Hypoglycemia, unspecified: Secondary | ICD-10-CM | POA: Diagnosis not present

## 2022-08-05 DIAGNOSIS — Z888 Allergy status to other drugs, medicaments and biological substances status: Secondary | ICD-10-CM | POA: Diagnosis not present

## 2022-08-05 DIAGNOSIS — I4892 Unspecified atrial flutter: Secondary | ICD-10-CM | POA: Diagnosis not present

## 2022-08-05 DIAGNOSIS — C181 Malignant neoplasm of appendix: Secondary | ICD-10-CM | POA: Diagnosis not present

## 2022-08-05 DIAGNOSIS — E43 Unspecified severe protein-calorie malnutrition: Secondary | ICD-10-CM | POA: Diagnosis not present

## 2022-08-05 DIAGNOSIS — N2581 Secondary hyperparathyroidism of renal origin: Secondary | ICD-10-CM | POA: Diagnosis not present

## 2022-08-05 DIAGNOSIS — Z781 Physical restraint status: Secondary | ICD-10-CM | POA: Diagnosis not present

## 2022-08-05 DIAGNOSIS — Z87440 Personal history of urinary (tract) infections: Secondary | ICD-10-CM | POA: Diagnosis not present

## 2022-08-05 DIAGNOSIS — Z940 Kidney transplant status: Secondary | ICD-10-CM | POA: Diagnosis not present

## 2022-08-05 DIAGNOSIS — Z881 Allergy status to other antibiotic agents status: Secondary | ICD-10-CM | POA: Diagnosis not present

## 2022-08-05 DIAGNOSIS — E785 Hyperlipidemia, unspecified: Secondary | ICD-10-CM | POA: Diagnosis not present

## 2022-08-05 DIAGNOSIS — Z7984 Long term (current) use of oral hypoglycemic drugs: Secondary | ICD-10-CM | POA: Diagnosis not present

## 2022-08-05 DIAGNOSIS — Z960 Presence of urogenital implants: Secondary | ICD-10-CM | POA: Diagnosis not present

## 2022-08-05 DIAGNOSIS — Z9851 Tubal ligation status: Secondary | ICD-10-CM | POA: Diagnosis not present

## 2022-08-05 DIAGNOSIS — C182 Malignant neoplasm of ascending colon: Secondary | ICD-10-CM | POA: Diagnosis not present

## 2022-08-05 DIAGNOSIS — E1165 Type 2 diabetes mellitus with hyperglycemia: Secondary | ICD-10-CM | POA: Diagnosis not present

## 2022-08-05 DIAGNOSIS — N186 End stage renal disease: Secondary | ICD-10-CM | POA: Diagnosis not present

## 2022-08-05 DIAGNOSIS — C188 Malignant neoplasm of overlapping sites of colon: Secondary | ICD-10-CM | POA: Diagnosis not present

## 2022-08-05 DIAGNOSIS — I10 Essential (primary) hypertension: Secondary | ICD-10-CM | POA: Diagnosis not present

## 2022-08-05 DIAGNOSIS — C786 Secondary malignant neoplasm of retroperitoneum and peritoneum: Secondary | ICD-10-CM | POA: Diagnosis not present

## 2022-08-05 DIAGNOSIS — Z7985 Long-term (current) use of injectable non-insulin antidiabetic drugs: Secondary | ICD-10-CM | POA: Diagnosis not present

## 2022-08-05 DIAGNOSIS — Z931 Gastrostomy status: Secondary | ICD-10-CM | POA: Diagnosis not present

## 2022-08-05 DIAGNOSIS — I480 Paroxysmal atrial fibrillation: Secondary | ICD-10-CM | POA: Diagnosis not present

## 2022-08-05 DIAGNOSIS — Z794 Long term (current) use of insulin: Secondary | ICD-10-CM | POA: Diagnosis not present

## 2022-08-05 LAB — BASIC METABOLIC PANEL
Anion gap: 11 (ref 5–15)
BUN: 16 mg/dL (ref 8–23)
CO2: 18 mmol/L — ABNORMAL LOW (ref 22–32)
Calcium: 8.7 mg/dL — ABNORMAL LOW (ref 8.9–10.3)
Chloride: 104 mmol/L (ref 98–111)
Creatinine, Ser: 0.79 mg/dL (ref 0.44–1.00)
GFR, Estimated: >60 mL/min (ref >60)
Glucose, Bld: 235 mg/dL — ABNORMAL HIGH (ref 70–99)
Potassium: 3.7 mmol/L (ref 3.5–5.1)
Sodium: 133 mmol/L — ABNORMAL LOW (ref 135–145)

## 2022-08-05 LAB — CBC WITH DIFFERENTIAL/PLATELET
Abs Immature Granulocytes: 0.07 10*3/uL (ref 0.00–0.07)
Basophils Absolute: 0 10*3/uL (ref 0.0–0.1)
Basophils Relative: 0 %
Eosinophils Absolute: 0 10*3/uL (ref 0.0–0.5)
Eosinophils Relative: 0 %
HCT: 32 % — ABNORMAL LOW (ref 36.0–46.0)
Hemoglobin: 10.3 g/dL — ABNORMAL LOW (ref 12.0–15.0)
Immature Granulocytes: 1 %
Lymphocytes Relative: 6 %
Lymphs Abs: 0.6 10*3/uL — ABNORMAL LOW (ref 0.7–4.0)
MCH: 28.9 pg (ref 26.0–34.0)
MCHC: 32.2 g/dL (ref 30.0–36.0)
MCV: 89.6 fL (ref 80.0–100.0)
Monocytes Absolute: 1 10*3/uL (ref 0.1–1.0)
Monocytes Relative: 10 %
Neutro Abs: 8.3 10*3/uL — ABNORMAL HIGH (ref 1.7–7.7)
Neutrophils Relative %: 83 %
Platelets: 339 10*3/uL (ref 150–400)
RBC: 3.57 MIL/uL — ABNORMAL LOW (ref 3.87–5.11)
RDW: 14.1 % (ref 11.5–15.5)
WBC: 10 10*3/uL (ref 4.0–10.5)
nRBC: 0 % (ref 0.0–0.2)

## 2022-08-05 LAB — TACROLIMUS LEVEL: Tacrolimus (FK506) - LabCorp: 9.2 ng/mL (ref 2.0–20.0)

## 2022-08-05 LAB — GLUCOSE, CAPILLARY
Glucose-Capillary: 212 mg/dL — ABNORMAL HIGH (ref 70–99)
Glucose-Capillary: 249 mg/dL — ABNORMAL HIGH (ref 70–99)
Glucose-Capillary: 254 mg/dL — ABNORMAL HIGH (ref 70–99)

## 2022-08-05 LAB — CEA: CEA: 23.5 ng/mL — ABNORMAL HIGH (ref 0.0–4.7)

## 2022-08-05 LAB — PROTIME-INR
INR: 1 (ref 0.8–1.2)
Prothrombin Time: 13.2 seconds (ref 11.4–15.2)

## 2022-08-05 LAB — MAGNESIUM: Magnesium: 2 mg/dL (ref 1.7–2.4)

## 2022-08-05 SURGERY — CANCELLED PROCEDURE

## 2022-08-05 MED ORDER — ENOXAPARIN SODIUM 40 MG/0.4ML IJ SOSY: 40.0000 mg | PREFILLED_SYRINGE | Freq: Every day | INTRAMUSCULAR | Status: DC

## 2022-08-05 NOTE — Progress Notes (Signed)
   08/05/22 0526  Provider Notification  Provider Name/Title  (provider aware)  Date Provider Notified 08/05/22   Ongoing [facility] Provider during aware.

## 2022-08-05 NOTE — Discharge Summary (Signed)
Physician Discharge Summary   [name] RUE:[id number] DOB: 07/01/1945 DOA: 08/02/2022  PCP: Terri Palmer, MD  Admit date: 08/02/2022 Discharge date: 08/05/2022  Admitted From: Home Disposition:  Transfer to [facility] Discharging physician: Lewie Chamber, MD Barriers to discharge: none  Recommendations at discharge: IR was planning tunneled cath placement on 7/22 to begin TPN; last nutrition ~7/13 Adjust BP regimen especially with NPO status Consult transplant team regarding meds if further prolonged NPO; has missed a couple doses of meds but mostly still able to take them inpatient   Discharge Condition: stable CODE STATUS: Full Diet recommendation:   Hospital Course: [name] is a 77 yo female with PMH renal transplant (02/17/11), osteoarthritis, HLD, DM II who presented to the hospital with abdominal pain and nausea/vomiting. She endorsed that symptoms had began approximately 1 week prior to hospitalization but started to dramatically worsen 2 days ago.  She has had unintentional weight loss over the past 1 month approximately 15 pounds.  She is typically cold natured but endorses some chills recently.  She denied any fevers or night sweats.  CT abdomen/pelvis was performed in the ER.  This showed circumferential thickening of the cecum/ascending colon with extension into the terminal ileum concerning for colonic neoplasm.  Dilated distal ileal loops noted suggesting partial SBO. General surgery and GI were consulted.  Patient admitted for further workup. Patient accepted in transfer to [facility] to Dr. Denny Peon.  See below for further A&P  Assessment and Plan: * Partial small bowel obstruction (HCC) - presumed due to underlying colonic mass (measures 4.8 x 6.4 cm in RLQ) per CT - GI and general surgery following - NPO per surgery for now - continue IVF, nausea, and pain control - tentative plan for colonoscopy per GI/OR schedule and/or if patient able  to tolerate colon prep adequately  - CT C/A/P performed 7/21 concerning for worsening obstruction especially as patient having more distension and no flatus/BM with "apple-core" appearance near lesion - for now, needs CVL and TPN while next steps are figured out (e.g. surgery) - given prolonged NPO (last nutrition approx 7/13); patient was scheduled to have tunneled cath placed 7/22 and to start TPN but patient accepted in transfer to [facility] and will still need CVL access and TPN initiated  Ascending colonic mass - per CT: "largest conglomerated mass in the right lower quadrant measures up to 4.8 x 6.4 cm" - see repeat CT on 7/21 as well; concern is worsening obstruction in the clinical context - see pSBO above as well  History of kidney transplant - RLQ - 2013 - transplanted in 2013; DDKT - continue prograf (patient may use home supply) and mycophenolate - continue prednisone - continue MWF Bactrim for ppx  Normocytic anemia - mild; baseline Hgb 11-12 g/dL - currently at baseline   Hypokalemia - replete as needed  Essential hypertension - hold clonidine and other home meds for now; trying to ease pill burden - will at least resume amlodipine and increase dose for now - PRN Labetalol or hydralazine   Type 2 diabetes mellitus with complication (HCC) - hold basal insulin for now - q4h SSI - A1c 8.1%   Principal Diagnosis: Partial small bowel obstruction [facility])  Discharge Diagnoses: Active Hospital Problems   Diagnosis Date Noted   Partial small bowel obstruction (HCC) 08/02/2022    Priority: 1.   Ascending colonic mass 08/02/2022    Priority: 2.   History of kidney transplant - RLQ - 2013 02/17/2011  Priority: 3.   Hypokalemia 08/02/2022   Immunosuppression due to drug therapy for kidney transplant 08/02/2022   Normocytic anemia 08/02/2022   History of recurrent UTIs 08/02/2022   History of diverticulitis of colon 2016 & 2018 08/02/2022   Type 2 diabetes mellitus  with complication (HCC) 02/18/2017   Essential hypertension 11/07/2010    Resolved Hospital Problems  No resolved problems to display.      Allergies as of 08/05/2022       Reactions   Amoxicillin-pot Clavulanate Nausea And Vomiting   Aspirin Nausea And Vomiting, Other (See Comments)   Dizziness, GI Intolerance, Ringing in ears   Ciprofloxacin Other (See Comments)   Dizziness   Fexofenadine Other (See Comments)   Dizziness   Ibuprofen Nausea And Vomiting, Other (See Comments)   Ringing in ears also   Nitrofurantoin Nausea Only, Other (See Comments)   GI Intolerance   Nitrofurantoin Macrocrystal Nausea Only   Simvastatin Other (See Comments)   Muscle aches        Medication List     STOP taking these medications    clotrimazole 1 % vaginal cream Commonly known as: GYNE-LOTRIMIN   fluticasone 50 MCG/ACT nasal spray Commonly known as: FLONASE       TAKE these medications    amLODipine 5 MG tablet Commonly known as: NORVASC Take 5 mg by mouth daily.   atorvastatin 10 MG tablet Commonly known as: LIPITOR Take 10 mg by mouth See admin instructions. Take 10 mg by mouth two to three times a week   BIOTIN PO Take 1 tablet by mouth daily with breakfast.   cloNIDine 0.1 MG tablet Commonly known as: CATAPRES Take 0.1 mg by mouth 2 (two) times daily.   magnesium oxide 400 MG tablet Commonly known as: MAG-OX Take 400 mg by mouth daily.   metFORMIN 500 MG 24 hr tablet Commonly known as: GLUCOPHAGE-XR Take 500 mg by mouth 2 (two) times daily.   mycophenolate 180 MG EC tablet Commonly known as: MYFORTIC Take 180 mg by mouth 2 (two) times daily.   potassium chloride SA 20 MEQ tablet Commonly known as: KLOR-CON M Take 20 mEq by mouth daily.   predniSONE 5 MG tablet Commonly known as: DELTASONE Take 5 mg by mouth daily with breakfast.   sulfamethoxazole-trimethoprim 400-80 MG tablet Commonly known as: BACTRIM Take 1 tablet by mouth every Monday, Wednesday,  and Friday.   tacrolimus 1 MG capsule Commonly known as: PROGRAF Take 2-3 mg by mouth 2 (two) times daily. Take 3mg  daily in the morning and 2mg  daily at night.   Trulicity 0.75 MG/0.5ML Sopn Generic drug: Dulaglutide Inject 0.75 mg into the skin every Sunday.   TYLENOL 500 MG tablet Generic drug: acetaminophen Take 500-1,000 mg by mouth every 6 (six) hours as needed (for headaches or pain).   Vitamin D3 50 MCG (2000 UT) Tabs Take 2,000 Units by mouth daily with breakfast.        Allergies  Allergen Reactions   Amoxicillin-Pot Clavulanate Nausea And Vomiting   Aspirin Nausea And Vomiting and Other (See Comments)    Dizziness, GI Intolerance, Ringing in ears   Ciprofloxacin Other (See Comments)    Dizziness   Fexofenadine Other (See Comments)    Dizziness   Ibuprofen Nausea And Vomiting and Other (See Comments)    Ringing in ears also   Nitrofurantoin Nausea Only and Other (See Comments)    GI Intolerance   Nitrofurantoin Macrocrystal Nausea Only   Simvastatin Other (See Comments)  Muscle aches    Consultations: General surgery GI  Procedures:   Discharge Exam: BP (!) 156/81 (BP Location: Right Arm)   Pulse (!) 121   Temp 98.3 F (36.8 C) (Oral)   Resp 16   Ht 4\' 11"  (1.499 m)   Wt 47.6 kg   SpO2 100%   BMI 21.21 kg/m  Physical Exam Constitutional:      General: She is not in acute distress.    Appearance: She is well-developed. She is not ill-appearing.  HENT:     Head: Normocephalic and atraumatic.     Mouth/Throat:     Mouth: Mucous membranes are moist.  Eyes:     Extraocular Movements: Extraocular movements intact.  Cardiovascular:     Rate and Rhythm: Normal rate and regular rhythm.  Pulmonary:     Effort: Pulmonary effort is normal. No respiratory distress.     Breath sounds: Normal breath sounds. No wheezing.  Abdominal:     General: There is distension (mild).     Palpations: Abdomen is soft.     Tenderness: There is abdominal  tenderness (mild TTP nonspecific).     Comments: Bowel sounds still appreciated, do not appreciate high pitch yet  Musculoskeletal:        General: Normal range of motion.     Cervical back: Normal range of motion and neck supple.  Skin:    General: Skin is warm and dry.  Neurological:     General: No focal deficit present.     Mental Status: She is alert.  Psychiatric:        Mood and Affect: Mood normal.        Behavior: Behavior normal.      The results of significant diagnostics from this hospitalization (including imaging, microbiology, ancillary and laboratory) are listed below for reference.   Microbiology: No results found for this or any previous visit (from the past 240 hour(s)).   Labs: BNP (last 3 results) No results for input(s): "BNP" in the last 8760 hours. Basic Metabolic Panel: Recent Labs  Lab 08/02/22 1137 08/03/22 0315 08/04/22 0334 08/05/22 0314  NA 134* 137 134* 133*  K 3.2* 3.2* 3.7 3.7  CL 100 106 103 104  CO2 25 21* 17* 18*  GLUCOSE 229* 126* 174* 235*  BUN 15 11 12 16   CREATININE 0.73 0.62 0.70 0.79  CALCIUM 8.6* 8.2* 8.5* 8.7*  MG  --  1.5* 2.1 2.0   Liver Function Tests: Recent Labs  Lab 08/02/22 1137  AST 14*  ALT 12  ALKPHOS 45  BILITOT 0.9  PROT 6.6  ALBUMIN 3.3*   Recent Labs  Lab 08/02/22 1137  LIPASE 29   No results for input(s): "AMMONIA" in the last 168 hours. CBC: Recent Labs  Lab 08/02/22 1137 08/03/22 0315 08/04/22 0334 08/05/22 0314  WBC 8.1 7.4 9.7 10.0  NEUTROABS 6.7 4.9 7.5 8.3*  HGB 11.4* 11.3* 11.1* 10.3*  HCT 35.2* 35.0* 35.5* 32.0*  MCV 89.3 90.7 91.3 89.6  PLT 381 336 379 339   Cardiac Enzymes: No results for input(s): "CKTOTAL", "CKMB", "CKMBINDEX", "TROPONINI" in the last 168 hours. BNP: Invalid input(s): "POCBNP" CBG: Recent Labs  Lab 08/04/22 1933 08/04/22 2340 08/05/22 0404 08/05/22 0802 08/05/22 0900  GLUCAP 228* 227* 212* 249* 254*   D-Dimer No results for input(s): "DDIMER" in  the last 72 hours. Hgb A1c No results for input(s): "HGBA1C" in the last 72 hours. Lipid Profile No results for input(s): "CHOL", "HDL", "LDLCALC", "TRIG", "CHOLHDL", "  LDLDIRECT" in the last 72 hours. Thyroid function studies No results for input(s): "TSH", "T4TOTAL", "T3FREE", "THYROIDAB" in the last 72 hours.  Invalid input(s): "FREET3" Anemia work up No results for input(s): "VITAMINB12", "FOLATE", "FERRITIN", "TIBC", "IRON", "RETICCTPCT" in the last 72 hours. Urinalysis    Component Value Date/Time   COLORURINE YELLOW 08/02/2022 1255   APPEARANCEUR CLEAR 08/02/2022 1255   LABSPEC 1.010 08/02/2022 1255   PHURINE 6.0 08/02/2022 1255   GLUCOSEU >=500 (A) 08/02/2022 1255   HGBUR SMALL (A) 08/02/2022 1255   BILIRUBINUR NEGATIVE 08/02/2022 1255   BILIRUBINUR negative 03/01/2022 0839   KETONESUR 20 (A) 08/02/2022 1255   PROTEINUR >=300 (A) 08/02/2022 1255   UROBILINOGEN 1.0 04/19/2022 0824   NITRITE NEGATIVE 08/02/2022 1255   LEUKOCYTESUR NEGATIVE 08/02/2022 1255   Sepsis Labs Recent Labs  Lab 08/02/22 1137 08/03/22 0315 08/04/22 0334 08/05/22 0314  WBC 8.1 7.4 9.7 10.0   Microbiology No results found for this or any previous visit (from the past 240 hour(s)).  Procedures/Studies: DG Abd 1 View  Result Date: 08/04/2022 CLINICAL DATA:  Abdominal distention.  Concern for colon cancer. EXAM: ABDOMEN - 1 VIEW COMPARISON:  CT chest abdomen and pelvis 08/04/2022 FINDINGS: There is a small amount of oral contrast seen within dilated small bowel loops in the left abdomen. The small bowel loops measure up to 3.8 cm. No other significant oral contrast identified. Air seen throughout nondilated colon to the level the rectum. Contrast is also seen within the bladder. Visualized lung bases are clear. IMPRESSION: Small amount of oral contrast seen within dilated small bowel loops in the left abdomen. No other significant oral contrast identified. Findings are concerning for ileus or  small-bowel obstruction. Electronically Signed   By: Darliss Cheney M.D.   On: 08/04/2022 16:36   CT CHEST ABDOMEN PELVIS W CONTRAST  Result Date: 08/04/2022 CLINICAL DATA:  Colon mass.  Staging. * Tracking Code: BO * EXAM: CT CHEST, ABDOMEN, AND PELVIS WITH CONTRAST TECHNIQUE: Multidetector CT imaging of the chest, abdomen and pelvis was performed following the standard protocol during bolus administration of intravenous contrast. RADIATION DOSE REDUCTION: This exam was performed according to the departmental dose-optimization program which includes automated exposure control, adjustment of the mA and/or kV according to patient size and/or use of iterative reconstruction technique. CONTRAST:  OMNIPAQUE IOHEXOL 300 MG/ML  SOLN COMPARISON:  Abdomen and pelvis CT 08/02/2022 FINDINGS: CT CHEST FINDINGS Cardiovascular: The heart size is normal. No substantial pericardial effusion. Coronary artery calcification is evident. Moderate atherosclerotic calcification is noted in the wall of the thoracic aorta. Mediastinum/Nodes: 1.6 cm left thyroid nodule evident. No mediastinal lymphadenopathy. There is no hilar lymphadenopathy. The esophagus has normal imaging features. There is no axillary lymphadenopathy. Lungs/Pleura: 9 mm ground-glass opacity identified superior segment right lower lobe on image 41/7. Dependent atelectasis noted left lower lobe most small left pleural effusion. Musculoskeletal: No worrisome lytic or sclerotic osseous abnormality. CT ABDOMEN PELVIS FINDINGS Hepatobiliary: 13 mm capsular subcapsular lesion identified posterior right liver on 43/2. There is no evidence for gallstones, gallbladder wall thickening, or pericholecystic fluid. Noncalcified gallstones evident. No intrahepatic or extrahepatic biliary dilation. Pancreas: No focal mass lesion. No dilatation of the main duct. No intraparenchymal cyst. No peripancreatic edema. Spleen: No splenomegaly. No suspicious focal mass lesion.  Adrenals/Urinary Tract: No adrenal nodule or mass. Native kidneys are markedly atrophic. Transplant kidney in the right pelvis. Punctate nonobstructing stone identified upper pole transplant kidney. No hydronephrosis in the transplant kidney. Stomach/Bowel: Stomach is distended with  fluid and contrast material. Duodenum is normally positioned as is the ligament of Treitz. Small bowel loops are opacified proximally. Distal small bowel is fluid-filled and dilated up to 3.4 cm diameter. Terminal ileum not well visualized. As on prior imaging there is a large irregular soft tissue mass involving the cecum and right colon. Lesion has an apple-core type configuration measuring on the order of 3.7 x 10.8 x 4.9 cm. Given the matting together of un opacified bowel loops an abnormal soft tissue, the margins of the right colon in the mass are not clearly defined. However there appears to be a mesenteric mass measuring on the order of 5.2 x 3.8 x 4.0 cm just medial to the ascending colon which could represent transmural tumor extension or metastatic lesion. 18 mm probable ileocolic lymph node seen on image 71/2 and coronal 56/5. The appendix is dilated and fluid-filled measuring up to 12 mm diameter. Given the lack of periappendiceal inflammation, appearance is probably related to obstruction at the base. Diverticular changes are noted in the left colon. Vascular/Lymphatic: There is moderate atherosclerotic calcification of the abdominal aorta without aneurysm. As above, there is abnormal soft tissue in the right mesentery with probable lymphadenopathy in the ileocolic mesentery. 9 mm omental nodule seen on image 61/2 with other scattered smaller omental nodules throughout. Multiple peritoneal nodules are seen in the high right upper abdomen adjacent to the liver (45/2) with index 10 mm peritoneal nodule seen posterior to the right liver on 52/2. Index 11 mm peritoneal nodule seen in the cul-de-sac on 98/2. Reproductive:  Calcifications in the uterus likely related to fibroids. Neither ovary is discretely visible. Other: Small to moderate volume ascites in the abdomen and pelvis. There is diffuse mesenteric edema/congestion. Musculoskeletal: No worrisome lytic or sclerotic osseous abnormality. Degenerative disc disease noted L1-2 and L5-S1. IMPRESSION: 1. Large irregular soft tissue mass involving the cecum and right colon with an apples core type configuration. Given the matting together of un opacified bowel loops and abnormal soft tissue, the margins of the right colon in the mass are not clearly defined. However, there appears to be a mesenteric mass measuring on the order of 5.2 x 3.8 x 4.0 cm just medial to the ascending colon which could represent transmural tumor extension or metastatic lesion. 2. 18 mm  ileocolic lymph node.  Consistent with metastatic disease 3. Multiple peritoneal nodules in the abdomen and pelvis consistent with metastatic disease to the peritoneum. 4. 13 mm capsular/subcapsular lesion posterior right liver. Imaging features compatible with metastatic disease. 5. Small to moderate volume ascites in the abdomen and pelvis with diffuse mesenteric edema/congestion. 6. No definite metastatic disease in the chest. There is a 9 mm ground-glass opacity superior segment right lower lobe. While not considered highly suspicious for metastatic disease follow-up warranted. Likely infectious/inflammatory etiology, primary lung adenocarcinoma could present similarly. 7. 1.6 cm left thyroid nodule. In the setting of significant comorbidities or limited life expectancy, no follow-up recommended (ref: J Am Coll Radiol. 2015 Feb;12(2): 143-50). 8. Cholelithiasis. 9. Transplant kidney in the right pelvis with punctate nonobstructing stone in the upper pole. 10.  Aortic Atherosclerosis (ICD10-I70.0). Electronically Signed   By: Kennith Center M.D.   On: 08/04/2022 05:40   CT ABDOMEN PELVIS WO CONTRAST  Result Date:  08/02/2022 CLINICAL DATA:  Abdominal pain, acute, nonlocalized EXAM: CT ABDOMEN AND PELVIS WITHOUT CONTRAST TECHNIQUE: Multidetector CT imaging of the abdomen and pelvis was performed following the standard protocol without IV contrast. RADIATION DOSE REDUCTION: This exam was  performed according to the departmental dose-optimization program which includes automated exposure control, adjustment of the mA and/or kV according to patient size and/or use of iterative reconstruction technique. COMPARISON:  CT scan abdomen and pelvis from 08/07/2010. FINDINGS: Lower chest: There are patchy atelectatic changes in the visualized lung bases. No overt consolidation. No pleural effusion. The heart is normal in size. No pericardial effusion. Hepatobiliary: The liver is normal in size. Non-cirrhotic configuration. No suspicious intrahepatic mass. However, there are multiple new heterogeneous hypoattenuating lesions along the liver capsule as well as adjacent peritoneal reflections, favored to represent tumor implants. Few of the lesions exhibit mild liver surface scalloping. No intrahepatic or extrahepatic bile duct dilation. Contracted gallbladder containing several noncalcified gallstones. Normal gallbladder wall thickness. No pericholecystic inflammatory changes. Pancreas: Unremarkable. No pancreatic ductal dilatation or surrounding inflammatory changes. Spleen: Within normal limits. No focal lesion. Adrenals/Urinary Tract: Adrenal glands are unremarkable. Small/atrophic bilateral native kidneys. There is a transplanted kidney in the right lower abdomen. No hydronephrosis. There is a subcentimeter sized hyperattenuating focus in the upper pole of the transplanted kidney, indeterminate but favored to represent a proteinaceous/hemorrhagic cyst. No hydronephrosis. No renal or ureteric calculi. Unremarkable urinary bladder. Stomach/Bowel: There is a probable small sliding hiatal hernia. There is marked circumferential thickening  of the cecum/ascending colon measuring up to 2.1 cm, with extension into the terminal ileum. There is resultant dilation of distal ileal loops measuring up to 3.8 cm in diameter. The remaining distal colon is nondilated. There are multiple diverticula in the sigmoid colon without diverticulitis. Stomach and proximal small bowel loops are also nondilated. Vascular/Lymphatic: There is mild ascites mainly in the dependent pelvis and in the perihepatic/perisplenic region. There are multiple heterogeneous nodules throughout the upper abdomen, favoring peritoneal carcinomatosis. One such largest conglomerated mass in the right lower quadrant measures up to 4.8 x 6.4 cm on coronal plane (series 4, image 43). No aneurysmal dilation of the major abdominal arteries. There are moderate peripheral atherosclerotic vascular calcifications of the aorta and its major branches. Reproductive: The uterus is unremarkable. No large adnexal mass. Other: The visualized soft tissues and abdominal wall are unremarkable. Musculoskeletal: No suspicious osseous lesions. IMPRESSION: 1. Marked circumferential thickening of the cecum/ascending colon with extension into the terminal ileum, consistent with a primary colonic neoplasm. There is resultant dilation of distal ileal loops measuring up to 3.8 cm in diameter, suggesting partial small bowel obstruction. The remaining distal colon is nondilated. 2. Multiple heterogeneous nodules throughout the abdomen, favoring peritoneal carcinomatosis. 3. Multiple other nonacute observations, as described above. Aortic Atherosclerosis (ICD10-I70.0). Electronically Signed   By: Jules Schick M.D.   On: 08/02/2022 14:06     Time coordinating discharge: Over 30 minutes    Lewie Chamber, MD  [facility] 08/05/2022, 11:45 AM

## 2022-08-05 NOTE — Progress Notes (Signed)
   08/04/22 2136  Provider Notification  Provider Name/Title  (notified on prior shift)  Date Provider Notified 08/04/22  Time Provider Notified 1410  Notification Reason  (yellow MEW, no change from earlier)

## 2022-08-05 NOTE — Progress Notes (Addendum)
Day of Surgery   Subjective/Chief Complaint: Pt was working on GI prep and threw up most of it.  She is passing scant flatus and had a small BM.  She still feels a bit bloated.    Objective: Vital signs in last 24 hours: Temp:  [98 F (36.7 C)-98.9 F (37.2 C)] 98.9 F (37.2 C) (07/22 0526) Pulse Rate:  [112-120] 120 (07/22 0526) Resp:  [15-17] 17 (07/22 0526) BP: (152-163)/(74-80) 155/80 (07/22 0526) SpO2:  [93 %-100 %] 97 % (07/22 0526) Last BM Date : 08/03/22  Intake/Output from previous day: 07/21 0701 - 07/22 0700 In: 1380 [P.O.:180; I.V.:1200] Out: -  Intake/Output this shift: No intake/output data recorded.  Gen:  looks weak.   Abd - soft, mildly distended; RUQ fullness.no rebound/guarding.  Mild right sided tenderness.    Lab Results:  Recent Labs    08/04/22 0334 08/05/22 0314  WBC 9.7 10.0  HGB 11.1* 10.3*  HCT 35.5* 32.0*  PLT 379 339   BMET Recent Labs    08/04/22 0334 08/05/22 0314  NA 134* 133*  K 3.7 3.7  CL 103 104  CO2 17* 18*  GLUCOSE 174* 235*  BUN 12 16  CREATININE 0.70 0.79  CALCIUM 8.5* 8.7*   PT/INR No results for input(s): "LABPROT", "INR" in the last 72 hours. ABG No results for input(s): "PHART", "HCO3" in the last 72 hours.  Invalid input(s): "PCO2", "PO2"  Studies/Results: DG Abd 1 View  Result Date: 08/04/2022 CLINICAL DATA:  Abdominal distention.  Concern for colon cancer. EXAM: ABDOMEN - 1 VIEW COMPARISON:  CT chest abdomen and pelvis 08/04/2022 FINDINGS: There is a small amount of oral contrast seen within dilated small bowel loops in the left abdomen. The small bowel loops measure up to 3.8 cm. No other significant oral contrast identified. Air seen throughout nondilated colon to the level the rectum. Contrast is also seen within the bladder. Visualized lung bases are clear. IMPRESSION: Small amount of oral contrast seen within dilated small bowel loops in the left abdomen. No other significant oral contrast identified.  Findings are concerning for ileus or small-bowel obstruction. Electronically Signed   By: Darliss Cheney M.D.   On: 08/04/2022 16:36   CT CHEST ABDOMEN PELVIS W CONTRAST  Result Date: 08/04/2022 CLINICAL DATA:  Colon mass.  Staging. * Tracking Code: BO * EXAM: CT CHEST, ABDOMEN, AND PELVIS WITH CONTRAST TECHNIQUE: Multidetector CT imaging of the chest, abdomen and pelvis was performed following the standard protocol during bolus administration of intravenous contrast. RADIATION DOSE REDUCTION: This exam was performed according to the departmental dose-optimization program which includes automated exposure control, adjustment of the mA and/or kV according to patient size and/or use of iterative reconstruction technique. CONTRAST:  OMNIPAQUE IOHEXOL 300 MG/ML  SOLN COMPARISON:  Abdomen and pelvis CT 08/02/2022 FINDINGS: CT CHEST FINDINGS Cardiovascular: The heart size is normal. No substantial pericardial effusion. Coronary artery calcification is evident. Moderate atherosclerotic calcification is noted in the wall of the thoracic aorta. Mediastinum/Nodes: 1.6 cm left thyroid nodule evident. No mediastinal lymphadenopathy. There is no hilar lymphadenopathy. The esophagus has normal imaging features. There is no axillary lymphadenopathy. Lungs/Pleura: 9 mm ground-glass opacity identified superior segment right lower lobe on image 41/7. Dependent atelectasis noted left lower lobe most small left pleural effusion. Musculoskeletal: No worrisome lytic or sclerotic osseous abnormality. CT ABDOMEN PELVIS FINDINGS Hepatobiliary: 13 mm capsular subcapsular lesion identified posterior right liver on 43/2. There is no evidence for gallstones, gallbladder wall thickening, or pericholecystic fluid.  Noncalcified gallstones evident. No intrahepatic or extrahepatic biliary dilation. Pancreas: No focal mass lesion. No dilatation of the main duct. No intraparenchymal cyst. No peripancreatic edema. Spleen: No splenomegaly. No  suspicious focal mass lesion. Adrenals/Urinary Tract: No adrenal nodule or mass. Native kidneys are markedly atrophic. Transplant kidney in the right pelvis. Punctate nonobstructing stone identified upper pole transplant kidney. No hydronephrosis in the transplant kidney. Stomach/Bowel: Stomach is distended with fluid and contrast material. Duodenum is normally positioned as is the ligament of Treitz. Small bowel loops are opacified proximally. Distal small bowel is fluid-filled and dilated up to 3.4 cm diameter. Terminal ileum not well visualized. As on prior imaging there is a large irregular soft tissue mass involving the cecum and right colon. Lesion has an apple-core type configuration measuring on the order of 3.7 x 10.8 x 4.9 cm. Given the matting together of un opacified bowel loops an abnormal soft tissue, the margins of the right colon in the mass are not clearly defined. However there appears to be a mesenteric mass measuring on the order of 5.2 x 3.8 x 4.0 cm just medial to the ascending colon which could represent transmural tumor extension or metastatic lesion. 18 mm probable ileocolic lymph node seen on image 71/2 and coronal 56/5. The appendix is dilated and fluid-filled measuring up to 12 mm diameter. Given the lack of periappendiceal inflammation, appearance is probably related to obstruction at the base. Diverticular changes are noted in the left colon. Vascular/Lymphatic: There is moderate atherosclerotic calcification of the abdominal aorta without aneurysm. As above, there is abnormal soft tissue in the right mesentery with probable lymphadenopathy in the ileocolic mesentery. 9 mm omental nodule seen on image 61/2 with other scattered smaller omental nodules throughout. Multiple peritoneal nodules are seen in the high right upper abdomen adjacent to the liver (45/2) with index 10 mm peritoneal nodule seen posterior to the right liver on 52/2. Index 11 mm peritoneal nodule seen in the cul-de-sac  on 98/2. Reproductive: Calcifications in the uterus likely related to fibroids. Neither ovary is discretely visible. Other: Small to moderate volume ascites in the abdomen and pelvis. There is diffuse mesenteric edema/congestion. Musculoskeletal: No worrisome lytic or sclerotic osseous abnormality. Degenerative disc disease noted L1-2 and L5-S1. IMPRESSION: 1. Large irregular soft tissue mass involving the cecum and right colon with an apples core type configuration. Given the matting together of un opacified bowel loops and abnormal soft tissue, the margins of the right colon in the mass are not clearly defined. However, there appears to be a mesenteric mass measuring on the order of 5.2 x 3.8 x 4.0 cm just medial to the ascending colon which could represent transmural tumor extension or metastatic lesion. 2. 18 mm  ileocolic lymph node.  Consistent with metastatic disease 3. Multiple peritoneal nodules in the abdomen and pelvis consistent with metastatic disease to the peritoneum. 4. 13 mm capsular/subcapsular lesion posterior right liver. Imaging features compatible with metastatic disease. 5. Small to moderate volume ascites in the abdomen and pelvis with diffuse mesenteric edema/congestion. 6. No definite metastatic disease in the chest. There is a 9 mm ground-glass opacity superior segment right lower lobe. While not considered highly suspicious for metastatic disease follow-up warranted. Likely infectious/inflammatory etiology, primary lung adenocarcinoma could present similarly. 7. 1.6 cm left thyroid nodule. In the setting of significant comorbidities or limited life expectancy, no follow-up recommended (ref: J Am Coll Radiol. 2015 Feb;12(2): 143-50). 8. Cholelithiasis. 9. Transplant kidney in the right pelvis with punctate nonobstructing stone in  the upper pole. 10.  Aortic Atherosclerosis (ICD10-I70.0). Electronically Signed   By: Kennith Center M.D.   On: 08/04/2022 05:40     Anti-infectives: Anti-infectives (From admission, onward)    Start     Dose/Rate Route Frequency Ordered Stop   08/05/22 1000  sulfamethoxazole-trimethoprim (BACTRIM) 400-80 MG per tablet 1 tablet        1 tablet Oral Once per day on Monday Wednesday Friday 08/02/22 1602         Assessment/Plan: Partial small bowel obstruction Cecal/ ascending colon mass - likely adenocarcinoma with evidence of matted lymph node metastases and peritoneal carcinomatosis CEA 23  staging scans negative for any definitive metastatic disease in the chest, but there is one suspicious nodule RLL.    Recs:  GI consult thinks unlikely they will be successful with colonoscopy given intolerance of prep. I discussed situation with patient.    Patient prefers to go to [facility] for surgery given transplant history.  I have called transfer center and am awaiting response.    Presuming there is peritoneal disease, likely patient would just get diverting loop ileostomy and then chemotherapy.    If there is no availability for transfer, will discuss surgery here.   Primary concern is that of managing immunosuppression in post op setting.       LOS: 3 days    Almond Lint 08/05/2022   Highly complex medical decision making/>30 min spent in reviewing data/contacting [facility]/family discussion.

## 2022-08-05 NOTE — Progress Notes (Signed)
Unable to tolerate prep for colonoscopy.  [facility] working on transfer to [facility] for further work-up, management, treatment, per patient request.  [facility] will sign-off; please call with any further questions; thank you for the consultation.

## 2022-08-05 NOTE — Progress Notes (Signed)
   08/05/22 0216  Provider Notification  Provider Name/Title J. Garner Nash NP  Date Provider Notified 08/05/22  Time Provider Notified [phone]  Method of Notification  (secure chat)  Notification Reason  (yellow MEWS no change from earlier)  Provider response Other (Comment) (acknowledge of info)  Date of Provider Response 08/05/22  Time of Provider Response [phone]

## 2022-08-06 LAB — TACROLIMUS LEVEL: Tacrolimus (FK506) - LabCorp: 8.9 ng/mL (ref 2.0–20.0)

## 2022-08-13 DIAGNOSIS — Z48815 Encounter for surgical aftercare following surgery on the digestive system: Secondary | ICD-10-CM | POA: Diagnosis not present

## 2022-08-13 DIAGNOSIS — E119 Type 2 diabetes mellitus without complications: Secondary | ICD-10-CM | POA: Diagnosis not present

## 2022-08-13 DIAGNOSIS — I4891 Unspecified atrial fibrillation: Secondary | ICD-10-CM | POA: Diagnosis not present

## 2022-08-13 DIAGNOSIS — Z940 Kidney transplant status: Secondary | ICD-10-CM | POA: Diagnosis not present

## 2022-08-13 DIAGNOSIS — I10 Essential (primary) hypertension: Secondary | ICD-10-CM | POA: Diagnosis not present

## 2022-08-13 DIAGNOSIS — K6389 Other specified diseases of intestine: Secondary | ICD-10-CM | POA: Diagnosis not present

## 2022-08-13 DIAGNOSIS — Z87440 Personal history of urinary (tract) infections: Secondary | ICD-10-CM | POA: Diagnosis not present

## 2022-08-13 DIAGNOSIS — E785 Hyperlipidemia, unspecified: Secondary | ICD-10-CM | POA: Diagnosis not present

## 2022-08-13 DIAGNOSIS — Z7952 Long term (current) use of systemic steroids: Secondary | ICD-10-CM | POA: Diagnosis not present

## 2022-08-13 DIAGNOSIS — Z7984 Long term (current) use of oral hypoglycemic drugs: Secondary | ICD-10-CM | POA: Diagnosis not present

## 2022-08-13 DIAGNOSIS — M1990 Unspecified osteoarthritis, unspecified site: Secondary | ICD-10-CM | POA: Diagnosis not present

## 2022-08-13 DIAGNOSIS — K5790 Diverticulosis of intestine, part unspecified, without perforation or abscess without bleeding: Secondary | ICD-10-CM | POA: Diagnosis not present

## 2022-08-13 DIAGNOSIS — E876 Hypokalemia: Secondary | ICD-10-CM | POA: Diagnosis not present

## 2022-08-13 DIAGNOSIS — D649 Anemia, unspecified: Secondary | ICD-10-CM | POA: Diagnosis not present

## 2022-08-16 DIAGNOSIS — C189 Malignant neoplasm of colon, unspecified: Secondary | ICD-10-CM | POA: Diagnosis not present

## 2022-08-16 DIAGNOSIS — I10 Essential (primary) hypertension: Secondary | ICD-10-CM | POA: Diagnosis not present

## 2022-08-17 DIAGNOSIS — I10 Essential (primary) hypertension: Secondary | ICD-10-CM | POA: Diagnosis not present

## 2022-08-17 DIAGNOSIS — I4891 Unspecified atrial fibrillation: Secondary | ICD-10-CM | POA: Diagnosis not present

## 2022-08-17 DIAGNOSIS — Z48815 Encounter for surgical aftercare following surgery on the digestive system: Secondary | ICD-10-CM | POA: Diagnosis not present

## 2022-08-17 DIAGNOSIS — D649 Anemia, unspecified: Secondary | ICD-10-CM | POA: Diagnosis not present

## 2022-08-17 DIAGNOSIS — K6389 Other specified diseases of intestine: Secondary | ICD-10-CM | POA: Diagnosis not present

## 2022-08-17 DIAGNOSIS — E119 Type 2 diabetes mellitus without complications: Secondary | ICD-10-CM | POA: Diagnosis not present

## 2022-08-19 DIAGNOSIS — K6389 Other specified diseases of intestine: Secondary | ICD-10-CM | POA: Diagnosis not present

## 2022-08-19 DIAGNOSIS — I4891 Unspecified atrial fibrillation: Secondary | ICD-10-CM | POA: Diagnosis not present

## 2022-08-19 DIAGNOSIS — I10 Essential (primary) hypertension: Secondary | ICD-10-CM | POA: Diagnosis not present

## 2022-08-19 DIAGNOSIS — Z48815 Encounter for surgical aftercare following surgery on the digestive system: Secondary | ICD-10-CM | POA: Diagnosis not present

## 2022-08-19 DIAGNOSIS — D649 Anemia, unspecified: Secondary | ICD-10-CM | POA: Diagnosis not present

## 2022-08-19 DIAGNOSIS — E119 Type 2 diabetes mellitus without complications: Secondary | ICD-10-CM | POA: Diagnosis not present

## 2022-08-21 DIAGNOSIS — C189 Malignant neoplasm of colon, unspecified: Secondary | ICD-10-CM | POA: Diagnosis not present

## 2022-08-22 DIAGNOSIS — K6389 Other specified diseases of intestine: Secondary | ICD-10-CM | POA: Diagnosis not present

## 2022-08-22 DIAGNOSIS — I4891 Unspecified atrial fibrillation: Secondary | ICD-10-CM | POA: Diagnosis not present

## 2022-08-22 DIAGNOSIS — I10 Essential (primary) hypertension: Secondary | ICD-10-CM | POA: Diagnosis not present

## 2022-08-22 DIAGNOSIS — E119 Type 2 diabetes mellitus without complications: Secondary | ICD-10-CM | POA: Diagnosis not present

## 2022-08-22 DIAGNOSIS — Z48815 Encounter for surgical aftercare following surgery on the digestive system: Secondary | ICD-10-CM | POA: Diagnosis not present

## 2022-08-22 DIAGNOSIS — D649 Anemia, unspecified: Secondary | ICD-10-CM | POA: Diagnosis not present

## 2022-08-26 DIAGNOSIS — I10 Essential (primary) hypertension: Secondary | ICD-10-CM | POA: Diagnosis not present

## 2022-08-26 DIAGNOSIS — E119 Type 2 diabetes mellitus without complications: Secondary | ICD-10-CM | POA: Diagnosis not present

## 2022-08-26 DIAGNOSIS — D649 Anemia, unspecified: Secondary | ICD-10-CM | POA: Diagnosis not present

## 2022-08-26 DIAGNOSIS — Z48815 Encounter for surgical aftercare following surgery on the digestive system: Secondary | ICD-10-CM | POA: Diagnosis not present

## 2022-08-26 DIAGNOSIS — K6389 Other specified diseases of intestine: Secondary | ICD-10-CM | POA: Diagnosis not present

## 2022-08-26 DIAGNOSIS — I4891 Unspecified atrial fibrillation: Secondary | ICD-10-CM | POA: Diagnosis not present

## 2022-08-28 DIAGNOSIS — Z48815 Encounter for surgical aftercare following surgery on the digestive system: Secondary | ICD-10-CM | POA: Diagnosis not present

## 2022-08-28 DIAGNOSIS — I10 Essential (primary) hypertension: Secondary | ICD-10-CM | POA: Diagnosis not present

## 2022-08-28 DIAGNOSIS — Z87440 Personal history of urinary (tract) infections: Secondary | ICD-10-CM | POA: Diagnosis not present

## 2022-08-28 DIAGNOSIS — Z940 Kidney transplant status: Secondary | ICD-10-CM | POA: Diagnosis not present

## 2022-08-28 DIAGNOSIS — E119 Type 2 diabetes mellitus without complications: Secondary | ICD-10-CM | POA: Diagnosis not present

## 2022-08-28 DIAGNOSIS — C189 Malignant neoplasm of colon, unspecified: Secondary | ICD-10-CM | POA: Diagnosis not present

## 2022-08-28 DIAGNOSIS — K6389 Other specified diseases of intestine: Secondary | ICD-10-CM | POA: Diagnosis not present

## 2022-08-28 DIAGNOSIS — D649 Anemia, unspecified: Secondary | ICD-10-CM | POA: Diagnosis not present

## 2022-08-28 DIAGNOSIS — I4891 Unspecified atrial fibrillation: Secondary | ICD-10-CM | POA: Diagnosis not present

## 2022-08-29 DIAGNOSIS — Z48815 Encounter for surgical aftercare following surgery on the digestive system: Secondary | ICD-10-CM | POA: Diagnosis not present

## 2022-08-29 DIAGNOSIS — E119 Type 2 diabetes mellitus without complications: Secondary | ICD-10-CM | POA: Diagnosis not present

## 2022-08-29 DIAGNOSIS — K6389 Other specified diseases of intestine: Secondary | ICD-10-CM | POA: Diagnosis not present

## 2022-08-29 DIAGNOSIS — D649 Anemia, unspecified: Secondary | ICD-10-CM | POA: Diagnosis not present

## 2022-08-29 DIAGNOSIS — I4891 Unspecified atrial fibrillation: Secondary | ICD-10-CM | POA: Diagnosis not present

## 2022-08-29 DIAGNOSIS — C189 Malignant neoplasm of colon, unspecified: Secondary | ICD-10-CM | POA: Diagnosis not present

## 2022-08-29 DIAGNOSIS — I10 Essential (primary) hypertension: Secondary | ICD-10-CM | POA: Diagnosis not present

## 2022-08-30 DIAGNOSIS — C189 Malignant neoplasm of colon, unspecified: Secondary | ICD-10-CM | POA: Diagnosis not present

## 2022-08-31 DIAGNOSIS — C189 Malignant neoplasm of colon, unspecified: Secondary | ICD-10-CM | POA: Diagnosis not present

## 2022-09-02 DIAGNOSIS — I10 Essential (primary) hypertension: Secondary | ICD-10-CM | POA: Diagnosis not present

## 2022-09-02 DIAGNOSIS — D649 Anemia, unspecified: Secondary | ICD-10-CM | POA: Diagnosis not present

## 2022-09-02 DIAGNOSIS — I4891 Unspecified atrial fibrillation: Secondary | ICD-10-CM | POA: Diagnosis not present

## 2022-09-02 DIAGNOSIS — K6389 Other specified diseases of intestine: Secondary | ICD-10-CM | POA: Diagnosis not present

## 2022-09-02 DIAGNOSIS — E119 Type 2 diabetes mellitus without complications: Secondary | ICD-10-CM | POA: Diagnosis not present

## 2022-09-02 DIAGNOSIS — Z48815 Encounter for surgical aftercare following surgery on the digestive system: Secondary | ICD-10-CM | POA: Diagnosis not present

## 2022-09-03 DIAGNOSIS — C182 Malignant neoplasm of ascending colon: Secondary | ICD-10-CM | POA: Diagnosis not present

## 2022-09-05 DIAGNOSIS — I4891 Unspecified atrial fibrillation: Secondary | ICD-10-CM | POA: Diagnosis not present

## 2022-09-05 DIAGNOSIS — Z48815 Encounter for surgical aftercare following surgery on the digestive system: Secondary | ICD-10-CM | POA: Diagnosis not present

## 2022-09-05 DIAGNOSIS — E119 Type 2 diabetes mellitus without complications: Secondary | ICD-10-CM | POA: Diagnosis not present

## 2022-09-05 DIAGNOSIS — D649 Anemia, unspecified: Secondary | ICD-10-CM | POA: Diagnosis not present

## 2022-09-05 DIAGNOSIS — K6389 Other specified diseases of intestine: Secondary | ICD-10-CM | POA: Diagnosis not present

## 2022-09-05 DIAGNOSIS — I10 Essential (primary) hypertension: Secondary | ICD-10-CM | POA: Diagnosis not present

## 2022-09-09 ENCOUNTER — Emergency Department (HOSPITAL_COMMUNITY): Payer: Medicare Other

## 2022-09-09 ENCOUNTER — Other Ambulatory Visit: Payer: Self-pay

## 2022-09-09 ENCOUNTER — Encounter (HOSPITAL_COMMUNITY): Payer: Self-pay | Admitting: Emergency Medicine

## 2022-09-09 ENCOUNTER — Inpatient Hospital Stay (HOSPITAL_COMMUNITY)
Admission: EM | Admit: 2022-09-09 | Discharge: 2022-09-13 | DRG: 374 | Disposition: A | Discharge status: Home / Self Care | Payer: Medicare Other | Attending: Internal Medicine | Admitting: Internal Medicine

## 2022-09-09 DIAGNOSIS — Z7985 Long-term (current) use of injectable non-insulin antidiabetic drugs: Secondary | ICD-10-CM

## 2022-09-09 DIAGNOSIS — Z7984 Long term (current) use of oral hypoglycemic drugs: Secondary | ICD-10-CM

## 2022-09-09 DIAGNOSIS — Z79899 Other long term (current) drug therapy: Secondary | ICD-10-CM

## 2022-09-09 DIAGNOSIS — Z886 Allergy status to analgesic agent status: Secondary | ICD-10-CM | POA: Diagnosis not present

## 2022-09-09 DIAGNOSIS — R0602 Shortness of breath: Secondary | ICD-10-CM | POA: Diagnosis not present

## 2022-09-09 DIAGNOSIS — Z940 Kidney transplant status: Secondary | ICD-10-CM

## 2022-09-09 DIAGNOSIS — J9811 Atelectasis: Secondary | ICD-10-CM | POA: Diagnosis not present

## 2022-09-09 DIAGNOSIS — E119 Type 2 diabetes mellitus without complications: Secondary | ICD-10-CM | POA: Diagnosis present

## 2022-09-09 DIAGNOSIS — E876 Hypokalemia: Secondary | ICD-10-CM | POA: Diagnosis present

## 2022-09-09 DIAGNOSIS — K30 Functional dyspepsia: Secondary | ICD-10-CM | POA: Diagnosis not present

## 2022-09-09 DIAGNOSIS — Z79621 Long term (current) use of calcineurin inhibitor: Secondary | ICD-10-CM

## 2022-09-09 DIAGNOSIS — R079 Chest pain, unspecified: Secondary | ICD-10-CM | POA: Diagnosis not present

## 2022-09-09 DIAGNOSIS — Z9049 Acquired absence of other specified parts of digestive tract: Secondary | ICD-10-CM

## 2022-09-09 DIAGNOSIS — C189 Malignant neoplasm of colon, unspecified: Secondary | ICD-10-CM | POA: Diagnosis not present

## 2022-09-09 DIAGNOSIS — C786 Secondary malignant neoplasm of retroperitoneum and peritoneum: Secondary | ICD-10-CM | POA: Diagnosis present

## 2022-09-09 DIAGNOSIS — R180 Malignant ascites: Secondary | ICD-10-CM | POA: Diagnosis not present

## 2022-09-09 DIAGNOSIS — R918 Other nonspecific abnormal finding of lung field: Secondary | ICD-10-CM | POA: Diagnosis not present

## 2022-09-09 DIAGNOSIS — Z7952 Long term (current) use of systemic steroids: Secondary | ICD-10-CM

## 2022-09-09 DIAGNOSIS — Z880 Allergy status to penicillin: Secondary | ICD-10-CM

## 2022-09-09 DIAGNOSIS — R112 Nausea with vomiting, unspecified: Secondary | ICD-10-CM | POA: Diagnosis not present

## 2022-09-09 DIAGNOSIS — J910 Malignant pleural effusion: Secondary | ICD-10-CM | POA: Diagnosis present

## 2022-09-09 DIAGNOSIS — R1111 Vomiting without nausea: Secondary | ICD-10-CM | POA: Diagnosis not present

## 2022-09-09 DIAGNOSIS — T300 Burn of unspecified body region, unspecified degree: Secondary | ICD-10-CM | POA: Diagnosis not present

## 2022-09-09 DIAGNOSIS — Z881 Allergy status to other antibiotic agents status: Secondary | ICD-10-CM | POA: Diagnosis not present

## 2022-09-09 DIAGNOSIS — R110 Nausea: Secondary | ICD-10-CM | POA: Diagnosis not present

## 2022-09-09 DIAGNOSIS — R000 Tachycardia, unspecified: Secondary | ICD-10-CM | POA: Diagnosis not present

## 2022-09-09 DIAGNOSIS — I10 Essential (primary) hypertension: Secondary | ICD-10-CM | POA: Diagnosis not present

## 2022-09-09 DIAGNOSIS — J189 Pneumonia, unspecified organism: Secondary | ICD-10-CM | POA: Diagnosis not present

## 2022-09-09 DIAGNOSIS — D84821 Immunodeficiency due to drugs: Secondary | ICD-10-CM | POA: Diagnosis not present

## 2022-09-09 DIAGNOSIS — R140 Abdominal distension (gaseous): Secondary | ICD-10-CM | POA: Diagnosis not present

## 2022-09-09 DIAGNOSIS — J90 Pleural effusion, not elsewhere classified: Secondary | ICD-10-CM | POA: Diagnosis not present

## 2022-09-09 DIAGNOSIS — E785 Hyperlipidemia, unspecified: Secondary | ICD-10-CM | POA: Diagnosis present

## 2022-09-09 DIAGNOSIS — E118 Type 2 diabetes mellitus with unspecified complications: Secondary | ICD-10-CM | POA: Diagnosis not present

## 2022-09-09 DIAGNOSIS — R109 Unspecified abdominal pain: Secondary | ICD-10-CM | POA: Diagnosis not present

## 2022-09-09 DIAGNOSIS — R188 Other ascites: Secondary | ICD-10-CM | POA: Diagnosis not present

## 2022-09-09 LAB — CBC WITH DIFFERENTIAL/PLATELET
Abs Immature Granulocytes: 0.02 10*3/uL (ref 0.00–0.07)
Basophils Absolute: 0 10*3/uL (ref 0.0–0.1)
Basophils Relative: 0 %
Eosinophils Absolute: 0 10*3/uL (ref 0.0–0.5)
Eosinophils Relative: 0 %
HCT: 31.7 % — ABNORMAL LOW (ref 36.0–46.0)
Hemoglobin: 9.9 g/dL — ABNORMAL LOW (ref 12.0–15.0)
Immature Granulocytes: 0 %
Lymphocytes Relative: 16 %
Lymphs Abs: 1.4 10*3/uL (ref 0.7–4.0)
MCH: 27.3 pg (ref 26.0–34.0)
MCHC: 31.2 g/dL (ref 30.0–36.0)
MCV: 87.6 fL (ref 80.0–100.0)
Monocytes Absolute: 0.8 10*3/uL (ref 0.1–1.0)
Monocytes Relative: 9 %
Neutro Abs: 6.2 10*3/uL (ref 1.7–7.7)
Neutrophils Relative %: 75 %
Platelets: 481 10*3/uL — ABNORMAL HIGH (ref 150–400)
RBC: 3.62 MIL/uL — ABNORMAL LOW (ref 3.87–5.11)
RDW: 15.5 % (ref 11.5–15.5)
WBC: 8.4 10*3/uL (ref 4.0–10.5)
nRBC: 0 % (ref 0.0–0.2)

## 2022-09-09 MED ORDER — ONDANSETRON HCL 4 MG/2ML IJ SOLN
4.0000 mg | Freq: Once | INTRAMUSCULAR | Status: AC
  Administered 2022-09-09: 4 mg via INTRAVENOUS
  Filled 2022-09-09: qty 2

## 2022-09-09 MED ORDER — PANTOPRAZOLE SODIUM 40 MG IV SOLR
40.0000 mg | Freq: Once | INTRAVENOUS | Status: AC
  Administered 2022-09-09: 40 mg via INTRAVENOUS
  Filled 2022-09-09: qty 10

## 2022-09-09 MED ORDER — LACTATED RINGERS IV BOLUS
1000.0000 mL | Freq: Once | INTRAVENOUS | Status: AC
  Administered 2022-09-09: 1000 mL via INTRAVENOUS

## 2022-09-09 NOTE — ED Provider Notes (Signed)
[place] EMERGENCY DEPARTMENT AT [facility] Provider Note   CSN: [id number] Arrival date & time: 09/09/22  2056     History  Chief Complaint  Patient presents with   Heartburn    [name] is a 77 y.o. female.  Patient is a 77 year old female with a past medical history of colon cancer status post recent colectomy and port placement not yet started on chemotherapy, renal transplant, diabetes senting to the emergency department with "heartburn".  The patient states that since having her port placed last week that she has been having constant heartburn.  She states it feels like a burning type of pain starting in her epigastrium going into her chest and radiating to her back.  She states that she has associated nausea and vomiting and has not been able to keep anything down today.  She denies any fevers.  She states that she does have some shortness of breath.  She denies any diarrhea or constipation.  She states that she does have a small amount of blood when she wipes but denies any blood mixed in with her stool or black appearing stool.  She denies any dysuria or hematuria.   Heartburn       Home Medications Prior to Admission medications   Medication Sig Start Date End Date Taking? Authorizing Provider  amLODipine (NORVASC) 5 MG tablet Take 5 mg by mouth daily.    [provider]  atorvastatin (LIPITOR) 10 MG tablet Take 10 mg by mouth See admin instructions. Take 10 mg by mouth two to three times a week    [provider]  BIOTIN PO Take 1 tablet by mouth daily with breakfast.    [provider]  Cholecalciferol (VITAMIN D3) 50 MCG (2000 UT) TABS Take 2,000 Units by mouth daily with breakfast.    [provider]  cloNIDine (CATAPRES) 0.1 MG tablet Take 0.1 mg by mouth 2 (two) times daily.    [provider]  Dulaglutide (TRULICITY) 0.75 MG/0.5ML SOPN Inject 0.75 mg into the skin every Sunday. Patient not taking:  Reported on 08/02/2022    [provider]  magnesium oxide (MAG-OX) 400 MG tablet Take 400 mg by mouth daily.    [provider]  metFORMIN (GLUCOPHAGE-XR) 500 MG 24 hr tablet Take 500 mg by mouth 2 (two) times daily.    [provider]  mycophenolate (MYFORTIC) 180 MG EC tablet Take 180 mg by mouth 2 (two) times daily.    [provider]  potassium chloride SA (K-DUR,KLOR-CON) 20 MEQ tablet Take 20 mEq by mouth daily.    [provider]  predniSONE (DELTASONE) 5 MG tablet Take 5 mg by mouth daily with breakfast.    [provider]  sulfamethoxazole-trimethoprim (BACTRIM) 400-80 MG tablet Take 1 tablet by mouth every Monday, Wednesday, and Friday.    [provider]  tacrolimus (PROGRAF) 1 MG capsule Take 2-3 mg by mouth 2 (two) times daily. Take 3mg  daily in the morning and 2mg  daily at night.    [provider]  TYLENOL 500 MG tablet Take 500-1,000 mg by mouth every 6 (six) hours as needed (for headaches or pain).    [provider]      Allergies    Amoxicillin-pot clavulanate, Aspirin, Ciprofloxacin, Fexofenadine, Ibuprofen, Nitrofurantoin, Nitrofurantoin macrocrystal, and Simvastatin    Review of Systems   Review of Systems  Gastrointestinal:  Positive for heartburn.    Physical Exam Updated Vital Signs BP 132/89 (BP Location:  Right Arm)   Pulse 98   Temp 98.7 F (37.1 C) (Oral)   Resp 18   Ht 4\' 11"  (1.499 m)   Wt 54.4 kg   SpO2 97%   BMI 24.24 kg/m  Physical Exam Vitals and nursing note reviewed.  Constitutional:      General: She is not in acute distress.    Appearance: She is ill-appearing.  HENT:     Head: Normocephalic and atraumatic.     Nose: Nose normal.     Mouth/Throat:     Mouth: Mucous membranes are dry.     Pharynx: Oropharynx is clear.  Eyes:     Extraocular Movements: Extraocular movements intact.     Conjunctiva/sclera: Conjunctivae normal.  Cardiovascular:     Rate and  Rhythm: Regular rhythm. Tachycardia present.     Heart sounds: Normal heart sounds.  Pulmonary:     Effort: Pulmonary effort is normal.     Breath sounds: Normal breath sounds.  Abdominal:     General: Abdomen is flat.     Palpations: Abdomen is soft.     Tenderness: There is no abdominal tenderness.  Musculoskeletal:        General: Normal range of motion.     Cervical back: Normal range of motion.  Skin:    General: Skin is warm and dry.  Neurological:     General: No focal deficit present.     Mental Status: She is alert and oriented to person, place, and time.  Psychiatric:        Mood and Affect: Mood normal.        Behavior: Behavior normal.     ED Results / Procedures / Treatments   Labs (all labs ordered are listed, but only abnormal results are displayed) Labs Reviewed  COMPREHENSIVE METABOLIC PANEL  CBC WITH DIFFERENTIAL/PLATELET  LIPASE, BLOOD  MAGNESIUM  TACROLIMUS LEVEL  TROPONIN I (HIGH SENSITIVITY)    EKG None  Radiology DG Chest Port 1 View  Result Date: 09/09/2022 CLINICAL DATA:  History of colon cancer presenting with mid chest pain, indigestion and weakness. EXAM: PORTABLE CHEST 1 VIEW COMPARISON:  August 07, 2010 FINDINGS: The right-sided venous Port-A-Cath is seen with its distal tip overlying the distal aspect of the superior vena cava. The heart size and mediastinal contours are within normal limits. There is marked severity calcification of the aortic arch. Mild, layering bilateral pleural effusions are seen. Mild atelectasis is noted within the left lung base. No pneumothorax is identified. The visualized skeletal structures are unremarkable. IMPRESSION: Mild, layering bilateral pleural effusions with mild left basilar atelectasis. Electronically Signed   By: Aram Candela M.D.   On: 09/09/2022 22:56    Procedures Procedures    Medications Ordered in ED Medications  lactated ringers bolus 1,000 mL (1,000 mLs Intravenous New Bag/Given 09/09/22  2319)  pantoprazole (PROTONIX) injection 40 mg (40 mg Intravenous Given 09/09/22 2319)  ondansetron (ZOFRAN) injection 4 mg (4 mg Intravenous Given 09/09/22 2319)    ED Course/ Medical Decision Making/ A&P Clinical Course as of 09/09/22 2330  Mon Sep 09, 2022  2328 Patient signed out to Dr. Manus Gunning pending labs and reassessment. [VK]    Clinical Course User Index [VK] Rexford Maus, DO                                 Medical Decision Making This patient presents to the ED with chief complaint(s)  of chest pain, N/V with pertinent past medical history of metastatic colon cancer s/p partial colectomy & port placement, renal transplant, DM which further complicates the presenting complaint. The complaint involves an extensive differential diagnosis and also carries with it a high risk of complications and morbidity.    The differential diagnosis includes ACS, arrhythmia, anemia, pneumonia, pneumothorax, pulmonary edema, pleural effusion, concern for possible PE with cancer history and recent procedures, gastritis, GERD, esophagitis, PUD, pancreatitis, hepatitis  Additional history obtained: Additional history obtained from spouse Records reviewed Care Everywhere/External Records - outpatient oncology records  ED Course and Reassessment: On patient's arrival she was mildly tachycardic, chronically ill-appearing though in no acute distress.  Patient's EKG on arrival showed sinus tachycardia with PVCs but no acute ischemic changes.  12 labs including troponin, lipase and magnesium chest x-ray performed.  With patient's tachycardia and chest pain will additionally have CT PE to evaluate for PE as cause of her symptoms.  She will be started on symptomatic management with fluids, Zofran and PPI and will be closely reassessed.  Independent labs interpretation:  - Pending  Independent visualization of imaging: - I independently visualized the following imaging with scope of interpretation  limited to determining acute life threatening conditions related to emergency care: CXR, which revealed bilateral pleural effusions  Consultation: - Consulted or discussed management/test interpretation w/ external professional: N/A    Amount and/or Complexity of Data Reviewed Labs: ordered. Radiology: ordered.  Risk Prescription drug management.          Final Clinical Impression(s) / ED Diagnoses Final diagnoses:  None    Rx / DC Orders ED Discharge Orders     None         Rexford Maus, DO 09/09/22 2330

## 2022-09-09 NOTE — ED Triage Notes (Signed)
BIBA Per EMS: Pt coming from home w/ c/o heartburn  Dx colon cancer recently. Recently got portacath placed and now has been having heartburn and nausea.  VSS

## 2022-09-10 ENCOUNTER — Encounter (HOSPITAL_COMMUNITY): Payer: Self-pay

## 2022-09-10 ENCOUNTER — Emergency Department (HOSPITAL_COMMUNITY): Payer: Medicare Other

## 2022-09-10 DIAGNOSIS — Z886 Allergy status to analgesic agent status: Secondary | ICD-10-CM | POA: Diagnosis not present

## 2022-09-10 DIAGNOSIS — R180 Malignant ascites: Secondary | ICD-10-CM | POA: Diagnosis present

## 2022-09-10 DIAGNOSIS — Z7984 Long term (current) use of oral hypoglycemic drugs: Secondary | ICD-10-CM | POA: Diagnosis not present

## 2022-09-10 DIAGNOSIS — E876 Hypokalemia: Secondary | ICD-10-CM | POA: Diagnosis present

## 2022-09-10 DIAGNOSIS — R918 Other nonspecific abnormal finding of lung field: Secondary | ICD-10-CM | POA: Diagnosis not present

## 2022-09-10 DIAGNOSIS — E785 Hyperlipidemia, unspecified: Secondary | ICD-10-CM | POA: Diagnosis present

## 2022-09-10 DIAGNOSIS — I10 Essential (primary) hypertension: Secondary | ICD-10-CM | POA: Diagnosis present

## 2022-09-10 DIAGNOSIS — Z940 Kidney transplant status: Secondary | ICD-10-CM | POA: Diagnosis not present

## 2022-09-10 DIAGNOSIS — E119 Type 2 diabetes mellitus without complications: Secondary | ICD-10-CM | POA: Diagnosis present

## 2022-09-10 DIAGNOSIS — E118 Type 2 diabetes mellitus with unspecified complications: Secondary | ICD-10-CM

## 2022-09-10 DIAGNOSIS — J189 Pneumonia, unspecified organism: Secondary | ICD-10-CM | POA: Diagnosis present

## 2022-09-10 DIAGNOSIS — Z9049 Acquired absence of other specified parts of digestive tract: Secondary | ICD-10-CM | POA: Diagnosis not present

## 2022-09-10 DIAGNOSIS — J90 Pleural effusion, not elsewhere classified: Secondary | ICD-10-CM

## 2022-09-10 DIAGNOSIS — C189 Malignant neoplasm of colon, unspecified: Secondary | ICD-10-CM

## 2022-09-10 DIAGNOSIS — R079 Chest pain, unspecified: Secondary | ICD-10-CM | POA: Diagnosis not present

## 2022-09-10 DIAGNOSIS — R188 Other ascites: Secondary | ICD-10-CM | POA: Diagnosis not present

## 2022-09-10 DIAGNOSIS — R112 Nausea with vomiting, unspecified: Secondary | ICD-10-CM | POA: Diagnosis not present

## 2022-09-10 DIAGNOSIS — R0602 Shortness of breath: Secondary | ICD-10-CM | POA: Diagnosis not present

## 2022-09-10 DIAGNOSIS — Z79899 Other long term (current) drug therapy: Secondary | ICD-10-CM | POA: Diagnosis not present

## 2022-09-10 DIAGNOSIS — Z7952 Long term (current) use of systemic steroids: Secondary | ICD-10-CM | POA: Diagnosis not present

## 2022-09-10 DIAGNOSIS — Z880 Allergy status to penicillin: Secondary | ICD-10-CM | POA: Diagnosis not present

## 2022-09-10 DIAGNOSIS — D84821 Immunodeficiency due to drugs: Secondary | ICD-10-CM

## 2022-09-10 DIAGNOSIS — J910 Malignant pleural effusion: Secondary | ICD-10-CM | POA: Diagnosis present

## 2022-09-10 DIAGNOSIS — Z7985 Long-term (current) use of injectable non-insulin antidiabetic drugs: Secondary | ICD-10-CM | POA: Diagnosis not present

## 2022-09-10 DIAGNOSIS — R109 Unspecified abdominal pain: Secondary | ICD-10-CM | POA: Diagnosis not present

## 2022-09-10 DIAGNOSIS — C786 Secondary malignant neoplasm of retroperitoneum and peritoneum: Secondary | ICD-10-CM | POA: Diagnosis present

## 2022-09-10 DIAGNOSIS — R140 Abdominal distension (gaseous): Secondary | ICD-10-CM | POA: Diagnosis not present

## 2022-09-10 DIAGNOSIS — Z79621 Long term (current) use of calcineurin inhibitor: Secondary | ICD-10-CM | POA: Diagnosis not present

## 2022-09-10 DIAGNOSIS — Z881 Allergy status to other antibiotic agents status: Secondary | ICD-10-CM | POA: Diagnosis not present

## 2022-09-10 LAB — RENAL FUNCTION PANEL
Albumin: 2.8 g/dL — ABNORMAL LOW (ref 3.5–5.0)
Anion gap: 14 (ref 5–15)
BUN: 9 mg/dL (ref 8–23)
CO2: 31 mmol/L (ref 22–32)
Calcium: 9 mg/dL (ref 8.9–10.3)
Chloride: 92 mmol/L — ABNORMAL LOW (ref 98–111)
Creatinine, Ser: 0.52 mg/dL (ref 0.44–1.00)
GFR, Estimated: >60 mL/min (ref >60)
Glucose, Bld: 177 mg/dL — ABNORMAL HIGH (ref 70–99)
Phosphorus: 2.8 mg/dL (ref 2.5–4.6)
Potassium: 3 mmol/L — ABNORMAL LOW (ref 3.5–5.1)
Sodium: 137 mmol/L (ref 135–145)

## 2022-09-10 LAB — COMPREHENSIVE METABOLIC PANEL
ALT: 10 U/L (ref 0–44)
AST: 18 U/L (ref 15–41)
Albumin: 2.7 g/dL — ABNORMAL LOW (ref 3.5–5.0)
Alkaline Phosphatase: 47 U/L (ref 38–126)
Anion gap: 15 (ref 5–15)
BUN: 15 mg/dL (ref 8–23)
CO2: 29 mmol/L (ref 22–32)
Calcium: 9.3 mg/dL (ref 8.9–10.3)
Chloride: 92 mmol/L — ABNORMAL LOW (ref 98–111)
Creatinine, Ser: 0.8 mg/dL (ref 0.44–1.00)
GFR, Estimated: >60 mL/min (ref >60)
Glucose, Bld: 173 mg/dL — ABNORMAL HIGH (ref 70–99)
Potassium: 3.2 mmol/L — ABNORMAL LOW (ref 3.5–5.1)
Sodium: 136 mmol/L (ref 135–145)
Total Bilirubin: 0.8 mg/dL (ref 0.3–1.2)
Total Protein: 6.5 g/dL (ref 6.5–8.1)

## 2022-09-10 LAB — URINALYSIS, ROUTINE W REFLEX MICROSCOPIC
Bacteria, UA: NONE SEEN
Bilirubin Urine: NEGATIVE
Glucose, UA: NEGATIVE mg/dL
Ketones, ur: 5 mg/dL — AB
Nitrite: NEGATIVE
Protein, ur: 30 mg/dL — AB
Specific Gravity, Urine: 1.005 (ref 1.005–1.030)
pH: 8 (ref 5.0–8.0)

## 2022-09-10 LAB — TROPONIN I (HIGH SENSITIVITY)
Troponin I (High Sensitivity): 32 ng/L — ABNORMAL HIGH (ref <18)
Troponin I (High Sensitivity): 34 ng/L — ABNORMAL HIGH (ref <18)

## 2022-09-10 LAB — MAGNESIUM: Magnesium: 1.9 mg/dL (ref 1.7–2.4)

## 2022-09-10 LAB — PROCALCITONIN: Procalcitonin: 1.8 ng/mL

## 2022-09-10 LAB — LIPASE, BLOOD: Lipase: 26 U/L (ref 11–51)

## 2022-09-10 LAB — BRAIN NATRIURETIC PEPTIDE: B Natriuretic Peptide: 176.5 pg/mL — ABNORMAL HIGH (ref 0.0–100.0)

## 2022-09-10 LAB — CREATININE, SERUM
Creatinine, Ser: 0.64 mg/dL (ref 0.44–1.00)
GFR, Estimated: >60 mL/min (ref >60)

## 2022-09-10 MED ORDER — MYCOPHENOLATE SODIUM 180 MG PO TBEC
180.0000 mg | DELAYED_RELEASE_TABLET | Freq: Two times a day (BID) | ORAL | Status: DC
  Filled 2022-09-10: qty 1

## 2022-09-10 MED ORDER — HYDRALAZINE HCL 20 MG/ML IJ SOLN: 5.0000 mg | INTRAMUSCULAR | Status: DC | PRN

## 2022-09-10 MED ORDER — SODIUM CHLORIDE (PF) 0.9 % IJ SOLN
INTRAMUSCULAR | Status: AC
  Filled 2022-09-10: qty 50

## 2022-09-10 MED ORDER — IOHEXOL 350 MG/ML SOLN
80.0000 mL | Freq: Once | INTRAVENOUS | Status: AC | PRN
  Administered 2022-09-10: 75 mL via INTRAVENOUS

## 2022-09-10 MED ORDER — HYDROMORPHONE HCL 1 MG/ML IJ SOLN
1.0000 mg | Freq: Once | INTRAMUSCULAR | Status: AC
  Administered 2022-09-10: 1 mg via INTRAVENOUS
  Filled 2022-09-10: qty 1

## 2022-09-10 MED ORDER — HEPARIN SODIUM (PORCINE) 5000 UNIT/ML IJ SOLN: 5000.0000 [IU] | Freq: Three times a day (TID) | INTRAMUSCULAR | Status: DC

## 2022-09-10 MED ORDER — PREDNISONE 5 MG PO TABS
5.0000 mg | ORAL_TABLET | Freq: Every day | ORAL | Status: DC
  Administered 2022-09-10 – 2022-09-13 (×4): 5 mg via ORAL
  Filled 2022-09-10 (×4): qty 1

## 2022-09-10 MED ORDER — ONDANSETRON HCL 4 MG/2ML IJ SOLN
4.0000 mg | Freq: Once | INTRAMUSCULAR | Status: AC
  Administered 2022-09-10: 4 mg via INTRAVENOUS
  Filled 2022-09-10: qty 2

## 2022-09-10 MED ORDER — AMLODIPINE BESYLATE 5 MG PO TABS
5.0000 mg | ORAL_TABLET | Freq: Once | ORAL | Status: AC
  Administered 2022-09-10: 5 mg via ORAL
  Filled 2022-09-10: qty 1

## 2022-09-10 MED ORDER — TACROLIMUS 1 MG PO CAPS
3.0000 mg | ORAL_CAPSULE | Freq: Every day | ORAL | Status: DC
  Administered 2022-09-10 – 2022-09-13 (×4): 3 mg via ORAL
  Filled 2022-09-10 (×4): qty 3

## 2022-09-10 MED ORDER — POTASSIUM CHLORIDE IN NACL 20-0.9 MEQ/L-% IV SOLN
INTRAVENOUS | Status: DC
  Filled 2022-09-10: qty 1000

## 2022-09-10 MED ORDER — SENNOSIDES-DOCUSATE SODIUM 8.6-50 MG PO TABS: 1.0000 | ORAL_TABLET | Freq: Every evening | ORAL | Status: DC | PRN

## 2022-09-10 MED ORDER — ACETAMINOPHEN 325 MG PO TABS
650.0000 mg | ORAL_TABLET | Freq: Four times a day (QID) | ORAL | Status: DC | PRN
  Administered 2022-09-11 – 2022-09-13 (×5): 650 mg via ORAL
  Filled 2022-09-10 (×5): qty 2

## 2022-09-10 MED ORDER — SODIUM CHLORIDE 0.9 % IV SOLN
2.0000 g | INTRAVENOUS | Status: DC
  Administered 2022-09-10 – 2022-09-11 (×2): 2 g via INTRAVENOUS
  Filled 2022-09-10 (×2): qty 20

## 2022-09-10 MED ORDER — CLONIDINE HCL 0.1 MG PO TABS
0.1000 mg | ORAL_TABLET | Freq: Two times a day (BID) | ORAL | Status: DC
  Administered 2022-09-10 – 2022-09-13 (×7): 0.1 mg via ORAL
  Filled 2022-09-10 (×7): qty 1

## 2022-09-10 MED ORDER — SODIUM CHLORIDE 0.9 % IV SOLN
500.0000 mg | INTRAVENOUS | Status: DC
  Administered 2022-09-10 – 2022-09-13 (×3): 500 mg via INTRAVENOUS
  Filled 2022-09-10 (×4): qty 5

## 2022-09-10 MED ORDER — MYCOPHENOLATE SODIUM 180 MG PO TBEC
180.0000 mg | DELAYED_RELEASE_TABLET | Freq: Every day | ORAL | Status: DC
  Administered 2022-09-10 – 2022-09-11 (×2): 180 mg via ORAL
  Filled 2022-09-10 (×2): qty 1

## 2022-09-10 MED ORDER — AMLODIPINE BESYLATE 5 MG PO TABS
5.0000 mg | ORAL_TABLET | Freq: Every day | ORAL | Status: DC
  Administered 2022-09-10 – 2022-09-13 (×4): 5 mg via ORAL
  Filled 2022-09-10 (×4): qty 1

## 2022-09-10 MED ORDER — LACTATED RINGERS IV BOLUS
1000.0000 mL | Freq: Once | INTRAVENOUS | Status: AC
  Administered 2022-09-10: 1000 mL via INTRAVENOUS

## 2022-09-10 MED ORDER — TACROLIMUS 1 MG PO CAPS
2.0000 mg | ORAL_CAPSULE | Freq: Every day | ORAL | Status: DC
  Administered 2022-09-10 – 2022-09-12 (×3): 2 mg via ORAL
  Filled 2022-09-10 (×4): qty 2

## 2022-09-10 MED ORDER — ACETAMINOPHEN 650 MG RE SUPP: 650.0000 mg | Freq: Four times a day (QID) | RECTAL | Status: DC | PRN

## 2022-09-10 MED ORDER — POTASSIUM CHLORIDE 10 MEQ/100ML IV SOLN
10.0000 meq | INTRAVENOUS | Status: AC
  Administered 2022-09-10 (×4): 10 meq via INTRAVENOUS
  Filled 2022-09-10 (×4): qty 100

## 2022-09-10 MED ORDER — ONDANSETRON HCL 4 MG/2ML IJ SOLN
4.0000 mg | Freq: Four times a day (QID) | INTRAMUSCULAR | Status: DC | PRN
  Administered 2022-09-11: 4 mg via INTRAVENOUS
  Filled 2022-09-10: qty 2

## 2022-09-10 MED ORDER — METOPROLOL TARTRATE 5 MG/5ML IV SOLN
5.0000 mg | Freq: Once | INTRAVENOUS | Status: AC
  Administered 2022-09-10: 5 mg via INTRAVENOUS
  Filled 2022-09-10: qty 5

## 2022-09-10 NOTE — ED Notes (Signed)
Assisted pt with bedpan

## 2022-09-10 NOTE — H&P (Addendum)
PCP:   Mila Palmer, MD   Chief Complaint:  Nausea and vomiting  HPI: This is a 77 yo female with PMH renal transplant (2013), HLD, DM II, HTN.  Recent diagnosis of metastatic colon cancer, sp hemicolectomy 09/03/2022.  She is scheduled to start chemotherapy.  Per patient yesterday morning she woke up patient just did not feel right.  Her heart was racing, she had lots of acid in her body.  She went and developed nausea and vomiting which persisted most of the day.  Her abdomen was sore but no pain.  She denies fevers but endorses chills.  She also had a port placed.  In the ER patient vitals stable, afebrile.  CTA chest shows large bilateral pleural effusions right slightly greater than left. Associated lower lobe consolidation is noted CT of the abdomen and pelvis showed: Changes consistent with the known right hemicolectomy. No obstructive changes at the anastomosis are seen. Significant increase in ascites and enhancing peritoneal implants consistent with progressive metastatic disease. Mesenteric involvement is also noted near the anastomotic site.   Review of Systems:  Per HPI  Past Medical History: Past Medical History:  Diagnosis Date   Diabetes mellitus type 2, controlled (HCC)    History of diverticulitis of colon 2016 & 2018 08/02/2022   History of kidney transplant 2011-02-18   [facility] -  ECD deceased donor kidney transplant on 02/18/2011   History of recurrent UTIs 08/02/2022   Hypertension    Renal insufficiency    Past Surgical History:  Procedure Laterality Date   KIDNEY TRANSPLANT  02-18-11   ECD deceased donor kidney transplant at [facility]   PERITONEAL CATHETER INSERTION  06/20/2010   PERITONEAL CATHETER REMOVAL  08/09/2010    Medications: Prior to Admission medications   Medication Sig Start Date End Date Taking? Authorizing Provider  amLODipine (NORVASC) 5 MG tablet Take 5 mg by mouth daily.   Yes [provider]  atorvastatin (LIPITOR)  10 MG tablet Take 10 mg by mouth See admin instructions. Take 10 mg by mouth two to three times a week   Yes [provider]  BIOTIN PO Take 1 tablet by mouth daily with breakfast.   Yes [provider]  Cholecalciferol (VITAMIN D3) 50 MCG (2000 UT) TABS Take 2,000 Units by mouth daily with breakfast.   Yes [provider]  cloNIDine (CATAPRES) 0.1 MG tablet Take 0.1 mg by mouth 2 (two) times daily.   Yes [provider]  HYDROcodone-acetaminophen (NORCO/VICODIN) 5-325 MG tablet Take 1 tablet by mouth every 8 (eight) hours as needed for severe pain or moderate pain. 08/28/22 09/27/22 Yes [provider]  magnesium oxide (MAG-OX) 400 MG tablet Take 400 mg by mouth daily.   Yes [provider]  metFORMIN (GLUCOPHAGE-XR) 500 MG 24 hr tablet Take 500 mg by mouth See admin instructions. Pt states she takes it every other day   Yes [provider]  mycophenolate (MYFORTIC) 180 MG EC tablet Take 180 mg by mouth 2 (two) times daily.   Yes [provider]  potassium chloride SA (K-DUR,KLOR-CON) 20 MEQ tablet Take 20 mEq by mouth daily.   Yes [provider]  predniSONE (DELTASONE) 5 MG tablet Take 5 mg by mouth daily with breakfast.   Yes [provider]  tacrolimus (PROGRAF) 1 MG capsule Take 2-3 mg by mouth 2 (two) times daily. Take 3mg  daily in the morning and 2mg  daily at night.   Yes [provider]  TYLENOL 500 MG  tablet Take 500-1,000 mg by mouth every 6 (six) hours as needed (for headaches or pain).   Yes [provider]  metoprolol succinate (TOPROL-XL) 50 MG 24 hr tablet Take 50 mg by mouth daily. Patient not taking: Reported on 09/10/2022 08/13/22   [provider]  sulfamethoxazole-trimethoprim (BACTRIM) 400-80 MG tablet Take 1 tablet by mouth every Monday, Wednesday, and Friday. Patient not taking: Reported on 09/10/2022    [provider]  valACYclovir (VALTREX) 1000 MG tablet  Take 500 mg by mouth every 12 (twelve) hours. For 10 days Patient not taking: Reported on 09/10/2022    [provider]    Allergies:   Allergies  Allergen Reactions   Amoxicillin-Pot Clavulanate Nausea And Vomiting   Aspirin Nausea And Vomiting and Other (See Comments)    Dizziness, GI Intolerance, Ringing in ears   Ciprofloxacin Other (See Comments)    Dizziness   Fexofenadine Other (See Comments)    Dizziness   Ibuprofen Nausea And Vomiting and Other (See Comments)    Ringing in ears also   Nitrofurantoin Nausea Only and Other (See Comments)    GI Intolerance   Nitrofurantoin Macrocrystal Nausea Only   Simvastatin Other (See Comments)    Muscle aches    Social History:  reports that she has never smoked. She has never used smokeless tobacco. She reports that she does not drink alcohol and does not use drugs.  Family History: Family History  Problem Relation Age of Onset   Healthy Mother    Cancer Father     Physical Exam: Vitals:   09/10/22 0245 09/10/22 0300 09/10/22 0415 09/10/22 0443  BP: (!) 188/101 (!) 144/100 (!) 164/89   Pulse:  (!) 114 73   Resp: (!) 24 20    Temp:    98.3 F (36.8 C)  TempSrc:    Oral  SpO2:  96% 100%   Weight:      Height:        General: A and O x 3, cachectic female, no acute distress, weak Eyes: Pink conjunctiva, no scleral icterus ENT: Dry oral mucosa, neck supple, no thyromegaly Lungs: clear to ascultation, no wheeze, no crackles, no use of accessory muscles.  Right-sided port Cardiovascular: RRR, no regurgitation, no gallops, no murmurs. No carotid bruits, no JVD Abdomen: soft, positive BS, distended abdomen w/ ascites.  Surgical incisions C/D/I  GU: not examined Neuro: CN II - XII grossly intact, sensation intact Musculoskeletal: strength 5/5 all extremities. +1 pitting edema RLE Skin: no rash, no subcutaneous crepitation, no decubitus Psych: appropriate patient  Labs on Admission:  Recent Labs     09/09/22 2303  NA 136  K 3.2*  CL 92*  CO2 29  GLUCOSE 173*  BUN 15  CREATININE 0.80  CALCIUM 9.3  MG 1.9   Recent Labs    09/09/22 2303  AST 18  ALT 10  ALKPHOS 47  BILITOT 0.8  PROT 6.5  ALBUMIN 2.7*   Recent Labs    09/09/22 2303  LIPASE 26   Recent Labs    09/09/22 2303  WBC 8.4  NEUTROABS 6.2  HGB 9.9*  HCT 31.7*  MCV 87.6  PLT 481*    Radiological Exams on Admission: CT Angio Chest PE W/Cm &/Or Wo Cm  Result Date: 09/10/2022 CLINICAL DATA:  History of colon carcinoma with recent port placement 11 days ago and persistent nausea and chest and abdominal pain, initial encounter EXAM: CT ANGIOGRAPHY CHEST CT ABDOMEN AND PELVIS WITH CONTRAST TECHNIQUE: Multidetector  CT imaging of the chest was performed using the standard protocol during bolus administration of intravenous contrast. Multiplanar CT image reconstructions and MIPs were obtained to evaluate the vascular anatomy. Multidetector CT imaging of the abdomen and pelvis was performed using the standard protocol during bolus administration of intravenous contrast. RADIATION DOSE REDUCTION: This exam was performed according to the departmental dose-optimization program which includes automated exposure control, adjustment of the mA and/or kV according to patient size and/or use of iterative reconstruction technique. CONTRAST:  75mL OMNIPAQUE IOHEXOL 350 MG/ML SOLN COMPARISON:  Chest x-ray from the previous day.  CT from 08/04/2018 FINDINGS: CTA CHEST FINDINGS Cardiovascular: Thoracic aorta demonstrates atherosclerotic calcifications without aneurysmal dilatation or dissection. No cardiac enlargement is seen. Coronary calcifications are noted. The pulmonary artery shows a normal branching pattern bilaterally. No filling defect to suggest pulmonary embolism is noted. Mediastinum/Nodes: Thoracic inlet is within normal limits. Right-sided chest port is noted. No hilar or mediastinal adenopathy is noted. The esophagus as  visualized is within normal limits. Lungs/Pleura: Large bilateral pleural effusions are noted right slightly greater than left. Associated lower lobe consolidation is seen. No parenchymal nodules are seen. No pneumothorax is noted. Musculoskeletal: No acute bony abnormality is noted. Review of the MIP images confirms the above findings. CT ABDOMEN and PELVIS FINDINGS Hepatobiliary: Liver is well visualized. Gallbladder is partially decompressed. Hypodensity seen in the posterior right lobe of the liver on the prior exam is not well appreciated on this exam. Pancreas: Unremarkable. No pancreatic ductal dilatation or surrounding inflammatory changes. Spleen: Normal in size without focal abnormality. Adrenals/Urinary Tract: Adrenal glands are within normal limits. Native kidneys are shrunken. Transplant kidney is noted in the right iliac fossa. No obstructive changes are seen. The bladder is well distended. Stomach/Bowel: Scattered diverticular change of the colon is noted. Changes are noted consistent with right hemicolectomy. Small bowel to colonic anastomosis is noted in the midline. No obstructive changes are seen. Small bowel and stomach show no obstructive change. Vascular/Lymphatic: Aortic atherosclerosis. No enlarged abdominal or pelvic lymph nodes. Reproductive: Uterus and bilateral adnexa are unremarkable. Other: Significant ascites is noted throughout the abdomen increased when compared with the prior exam. Multiple enhancing peritoneal implants are noted also increased when compared with the prior study consistent with progressive metastatic disease. Musculoskeletal: No acute or significant osseous findings. Review of the MIP images confirms the above findings. IMPRESSION: CTA of the chest: No evidence of pulmonary emboli. Large bilateral pleural effusions right slightly greater than left. Associated lower lobe consolidation is noted. CT of the abdomen and pelvis: Changes consistent with the known right  hemicolectomy. No obstructive changes at the anastomosis are seen. Significant increase in ascites and enhancing peritoneal implants consistent with progressive metastatic disease. Mesenteric involvement is also noted near the anastomotic site. Diverticulosis without diverticulitis. Right iliac fossa renal transplant. Electronically Signed   By: Alcide Clever M.D.   On: 09/10/2022 03:04   CT ABDOMEN PELVIS W CONTRAST  Result Date: 09/10/2022 CLINICAL DATA:  History of colon carcinoma with recent port placement 11 days ago and persistent nausea and chest and abdominal pain, initial encounter EXAM: CT ANGIOGRAPHY CHEST CT ABDOMEN AND PELVIS WITH CONTRAST TECHNIQUE: Multidetector CT imaging of the chest was performed using the standard protocol during bolus administration of intravenous contrast. Multiplanar CT image reconstructions and MIPs were obtained to evaluate the vascular anatomy. Multidetector CT imaging of the abdomen and pelvis was performed using the standard protocol during bolus administration of intravenous contrast. RADIATION DOSE REDUCTION: This exam was  performed according to the departmental dose-optimization program which includes automated exposure control, adjustment of the mA and/or kV according to patient size and/or use of iterative reconstruction technique. CONTRAST:  75mL OMNIPAQUE IOHEXOL 350 MG/ML SOLN COMPARISON:  Chest x-ray from the previous day.  CT from 08/04/2018 FINDINGS: CTA CHEST FINDINGS Cardiovascular: Thoracic aorta demonstrates atherosclerotic calcifications without aneurysmal dilatation or dissection. No cardiac enlargement is seen. Coronary calcifications are noted. The pulmonary artery shows a normal branching pattern bilaterally. No filling defect to suggest pulmonary embolism is noted. Mediastinum/Nodes: Thoracic inlet is within normal limits. Right-sided chest port is noted. No hilar or mediastinal adenopathy is noted. The esophagus as visualized is within normal  limits. Lungs/Pleura: Large bilateral pleural effusions are noted right slightly greater than left. Associated lower lobe consolidation is seen. No parenchymal nodules are seen. No pneumothorax is noted. Musculoskeletal: No acute bony abnormality is noted. Review of the MIP images confirms the above findings. CT ABDOMEN and PELVIS FINDINGS Hepatobiliary: Liver is well visualized. Gallbladder is partially decompressed. Hypodensity seen in the posterior right lobe of the liver on the prior exam is not well appreciated on this exam. Pancreas: Unremarkable. No pancreatic ductal dilatation or surrounding inflammatory changes. Spleen: Normal in size without focal abnormality. Adrenals/Urinary Tract: Adrenal glands are within normal limits. Native kidneys are shrunken. Transplant kidney is noted in the right iliac fossa. No obstructive changes are seen. The bladder is well distended. Stomach/Bowel: Scattered diverticular change of the colon is noted. Changes are noted consistent with right hemicolectomy. Small bowel to colonic anastomosis is noted in the midline. No obstructive changes are seen. Small bowel and stomach show no obstructive change. Vascular/Lymphatic: Aortic atherosclerosis. No enlarged abdominal or pelvic lymph nodes. Reproductive: Uterus and bilateral adnexa are unremarkable. Other: Significant ascites is noted throughout the abdomen increased when compared with the prior exam. Multiple enhancing peritoneal implants are noted also increased when compared with the prior study consistent with progressive metastatic disease. Musculoskeletal: No acute or significant osseous findings. Review of the MIP images confirms the above findings. IMPRESSION: CTA of the chest: No evidence of pulmonary emboli. Large bilateral pleural effusions right slightly greater than left. Associated lower lobe consolidation is noted. CT of the abdomen and pelvis: Changes consistent with the known right hemicolectomy. No obstructive  changes at the anastomosis are seen. Significant increase in ascites and enhancing peritoneal implants consistent with progressive metastatic disease. Mesenteric involvement is also noted near the anastomotic site. Diverticulosis without diverticulitis. Right iliac fossa renal transplant. Electronically Signed   By: Alcide Clever M.D.   On: 09/10/2022 03:04   DG Chest Port 1 View  Result Date: 09/09/2022 CLINICAL DATA:  History of colon cancer presenting with mid chest pain, indigestion and weakness. EXAM: PORTABLE CHEST 1 VIEW COMPARISON:  August 07, 2010 FINDINGS: The right-sided venous Port-A-Cath is seen with its distal tip overlying the distal aspect of the superior vena cava. The heart size and mediastinal contours are within normal limits. There is marked severity calcification of the aortic arch. Mild, layering bilateral pleural effusions are seen. Mild atelectasis is noted within the left lung base. No pneumothorax is identified. The visualized skeletal structures are unremarkable. IMPRESSION: Mild, layering bilateral pleural effusions with mild left basilar atelectasis. Electronically Signed   By: Aram Candela M.D.   On: 09/09/2022 22:56    Assessment/Plan Present on Admission:  Left lower lobe consolidation //  immunocompromised patient due to drugs -Pneumonia order set initiated -Blood cultures x 2 ordered -IV antibiotics Rocephin and azithromycin -  Incentive parameter ordered -Nebulizers as needed -Stable on room air   Nausea and vomiting -Treating with antiemetics as needed Zofran and Phenergan. -Likely secondary to pneumonia and malignancy   Bilateral large pleural effusions //  Ascites -Would likely benefit from thoracentesis/paracentesis.  High likelihood of recurrence.  Defer to a.m. team -Albumin level 2.7   Colon cancer with mets -Status post hemicolectomy -Oncology appointment scheduled for September night to discuss chemotherapy   Essential hypertension -Norvasc,  metoprolol resumed   Type 2 diabetes mellitus with complication (HCC) -Sliding scale insulin -Patient's takes Trulicity every Sunday.   HLD -Atorvastatin,   History of renal transplant -Mycophenolate, Prograf on hold while infection being treated. -Patient Bactrim resumed  Zuria Fosdick 09/10/2022, 5:39 AM

## 2022-09-10 NOTE — ED Provider Notes (Signed)
Care transferred from Dr. Theresia Lo.  Patient was recently diagnosed colon cancer here with heartburn.  Awaiting labs and imaging.  She is tachycardic and did not take her medications tonight.  Labs show creatinine at baseline.  CT pending to evaluate for pulmonary emboli.  No evidence of PE but does show large pleural effusions as well as suspected malignant ascites and peritoneal implants.  Patient's surgery was performed at [facility] in July.  She has not yet started chemotherapy.  Concern for worsening of her metastatic disease causing likely malignant pleural effusions and ascites.  Plan admission for further evaluation and likely thoracentesis and paracentesis. Discussed with Dr. Joneen Roach.   Terri Octave, MD 09/10/22 [phone]

## 2022-09-10 NOTE — ED Notes (Signed)
Assisted pt on the bedpan.

## 2022-09-10 NOTE — ED Notes (Signed)
Pt stated that she needs to have a BM, places pt on bedpan

## 2022-09-10 NOTE — Progress Notes (Signed)
Brief note: -History of renal transplant, diabetes mellitus, hypertension and hyperlipidemia. -Admitted with nausea and vomiting.  As per H&P done earlier today: "This is a 77 yo female with PMH renal transplant (2013), HLD, DM II, HTN.  Recent diagnosis of metastatic colon cancer, sp hemicolectomy 09/03/2022.  She is scheduled to start chemotherapy.  Per patient yesterday morning she woke up patient just did not feel right.  Her heart was racing, she had lots of acid in her body.  She went and developed nausea and vomiting which persisted most of the day.  Her abdomen was sore but no pain.  She denies fevers but endorses chills.  She also had a port placed.   In the ER patient vitals stable, afebrile.  CTA chest shows large bilateral pleural effusions right slightly greater than left. Associated lower lobe consolidation is noted CT of the abdomen and pelvis showed: Changes consistent with the known right hemicolectomy. No obstructive changes at the anastomosis are seen. Significant increase in ascites and enhancing peritoneal implants consistent with progressive metastatic disease. Mesenteric involvement is also noted near the anastomotic site".   09/10/2022: Patient seen.  Manage expectantly.  Mild tachycardia noted (108 bpm).  Blood pressure is reasonably controlled.  Potassium of 3.  Continue to monitor and replete.  Also check magnesium levels.  Further management depending on hospital course.

## 2022-09-10 NOTE — ED Notes (Signed)
ED TO INPATIENT HANDOFF REPORT  ED Nurse Name and Phone #: Casimiro Needle [phone]  S Name/Age/Gender [name] 77 y.o. female Room/Bed: WA13/WA13  Code Status   Code Status: Full Code  Home/SNF/Other Home Patient oriented to: self, time, and situation Is this baseline? Yes   Triage Complete: Triage complete  Chief Complaint Nausea and vomiting [R11.2]  Triage Note BIBA Per EMS: Pt coming from home w/ c/o heartburn  Dx colon cancer recently. Recently got portacath placed and now has been having heartburn and nausea.  VSS     Allergies Allergies  Allergen Reactions   Amoxicillin-Pot Clavulanate Nausea And Vomiting   Aspirin Nausea And Vomiting and Other (See Comments)    Dizziness, GI Intolerance, Ringing in ears   Ciprofloxacin Other (See Comments)    Dizziness   Fexofenadine Other (See Comments)    Dizziness   Ibuprofen Nausea And Vomiting and Other (See Comments)    Ringing in ears also   Nitrofurantoin Nausea Only and Other (See Comments)    GI Intolerance   Nitrofurantoin Macrocrystal Nausea Only   Simvastatin Other (See Comments)    Muscle aches    Level of Care/Admitting Diagnosis ED Disposition     ED Disposition  Admit   Condition  --   Comment  Hospital Area: [facility] [[id number]]  Level of Care: Med-Surg [16]  May place patient in observation at [facility] or [facility] if equivalent level of care is available:: Yes  Covid Evaluation: Asymptomatic - no recent exposure (last 10 days) testing not required  Diagnosis: Nausea and vomiting [744752]  Admitting Physician: Gery Pray [[id number]]  Attending Physician: Alvester Chou          B Medical/Surgery History Past Medical History:  Diagnosis Date   Diabetes mellitus type 2, controlled (HCC)    History of diverticulitis of colon 2016 & 2018 08/02/2022   History of kidney transplant Mar 14, 2011   [facility] -  ECD deceased donor kidney transplant on 14-Mar-2011    History of recurrent UTIs 08/02/2022   Hypertension    Renal insufficiency    Past Surgical History:  Procedure Laterality Date   KIDNEY TRANSPLANT  March 14, 2011   ECD deceased donor kidney transplant at [facility]   PERITONEAL CATHETER INSERTION  06/20/2010   PERITONEAL CATHETER REMOVAL  08/09/2010     A IV Location/Drains/Wounds Patient Lines/Drains/Airways Status     Active Line/Drains/Airways     Name Placement date Placement time Site Days   Peripheral IV 09/09/22 20 G Posterior;Proximal;Right Forearm 09/09/22  2318  Forearm  1   External Urinary Catheter 09/10/22  0433  --  less than 1            Intake/Output Last 24 hours  Intake/Output Summary (Last 24 hours) at 09/10/2022 1119 Last data filed at 09/10/2022 0503 Gross per 24 hour  Intake 1000 ml  Output --  Net 1000 ml    Labs/Imaging Results for orders placed or performed during the hospital encounter of 09/09/22 (from the past 48 hour(s))  Comprehensive metabolic panel     Status: Abnormal   Collection Time: 09/09/22 11:03 PM  Result Value Ref Range   Sodium 136 135 - 145 mmol/L   Potassium 3.2 (L) 3.5 - 5.1 mmol/L   Chloride 92 (L) 98 - 111 mmol/L   CO2 29 22 - 32 mmol/L   Glucose, Bld 173 (H) 70 - 99 mg/dL    Comment: Glucose reference range applies only to samples  taken after fasting for at least 8 hours.   BUN 15 8 - 23 mg/dL   Creatinine, Ser 0.10 0.44 - 1.00 mg/dL   Calcium 9.3 8.9 - 27.2 mg/dL   Total Protein 6.5 6.5 - 8.1 g/dL   Albumin 2.7 (L) 3.5 - 5.0 g/dL   AST 18 15 - 41 U/L   ALT 10 0 - 44 U/L   Alkaline Phosphatase 47 38 - 126 U/L   Total Bilirubin 0.8 0.3 - 1.2 mg/dL   GFR, Estimated >53 >66 mL/min    Comment: (NOTE) Calculated using the CKD-EPI Creatinine Equation (2021)    Anion gap 15 5 - 15    Comment: Performed at [facility], [address], [place], [place] [address]  CBC with Differential     Status: Abnormal   Collection Time: 09/09/22 11:03  PM  Result Value Ref Range   WBC 8.4 4.0 - 10.5 K/uL   RBC 3.62 (L) 3.87 - 5.11 MIL/uL   Hemoglobin 9.9 (L) 12.0 - 15.0 g/dL   HCT 74.2 (L) 59.5 - 63.8 %   MCV 87.6 80.0 - 100.0 fL   MCH 27.3 26.0 - 34.0 pg   MCHC 31.2 30.0 - 36.0 g/dL   RDW 75.6 43.3 - 29.5 %   Platelets 481 (H) 150 - 400 K/uL   nRBC 0.0 0.0 - 0.2 %   Neutrophils Relative % 75 %   Neutro Abs 6.2 1.7 - 7.7 K/uL   Lymphocytes Relative 16 %   Lymphs Abs 1.4 0.7 - 4.0 K/uL   Monocytes Relative 9 %   Monocytes Absolute 0.8 0.1 - 1.0 K/uL   Eosinophils Relative 0 %   Eosinophils Absolute 0.0 0.0 - 0.5 K/uL   Basophils Relative 0 %   Basophils Absolute 0.0 0.0 - 0.1 K/uL   Immature Granulocytes 0 %   Abs Immature Granulocytes 0.02 0.00 - 0.07 K/uL    Comment: Performed at [facility], [address] [place], [place] [address]  Troponin I (High Sensitivity)     Status: Abnormal   Collection Time: 09/09/22 11:03 PM  Result Value Ref Range   Troponin I (High Sensitivity) 32 (H) <18 ng/L    Comment: (NOTE) Elevated high sensitivity troponin I (hsTnI) values and significant  changes across serial measurements may suggest ACS but many other  chronic and acute conditions are known to elevate hsTnI results.  Refer to the "Links" section for chest pain algorithms and additional  guidance. Performed at [facility], [address] [place], [place] [address]   Lipase, blood     Status: None   Collection Time: 09/09/22 11:03 PM  Result Value Ref Range   Lipase 26 11 - 51 U/L    Comment: Performed at [facility], [address], [place], [place] [address]  Magnesium     Status: None   Collection Time: 09/09/22 11:03 PM  Result Value Ref Range   Magnesium 1.9 1.7 - 2.4 mg/dL    Comment: Performed at [facility], [address], [place], [place] [address]  Urinalysis, Routine w reflex microscopic -Urine, Clean Catch     Status: Abnormal    Collection Time: 09/10/22  2:17 AM  Result Value Ref Range   Color, Urine STRAW (A) YELLOW   APPearance CLEAR CLEAR   Specific Gravity, Urine 1.005 1.005 - 1.030   pH 8.0 5.0 - 8.0   Glucose, UA NEGATIVE NEGATIVE mg/dL  Hgb urine dipstick SMALL (A) NEGATIVE   Bilirubin Urine NEGATIVE NEGATIVE   Ketones, ur 5 (A) NEGATIVE mg/dL   Protein, ur 30 (A) NEGATIVE mg/dL   Nitrite NEGATIVE NEGATIVE   Leukocytes,Ua SMALL (A) NEGATIVE   RBC / HPF 0-5 0 - 5 RBC/hpf   WBC, UA 6-10 0 - 5 WBC/hpf   Bacteria, UA NONE SEEN NONE SEEN   Squamous Epithelial / HPF 0-5 0 - 5 /HPF   Mucus PRESENT    Hyaline Casts, UA PRESENT     Comment: Performed at [facility], [address] [place], [place] [address]  Troponin I (High Sensitivity)     Status: Abnormal   Collection Time: 09/10/22  2:29 AM  Result Value Ref Range   Troponin I (High Sensitivity) 34 (H) <18 ng/L    Comment: (NOTE) Elevated high sensitivity troponin I (hsTnI) values and significant  changes across serial measurements may suggest ACS but many other  chronic and acute conditions are known to elevate hsTnI results.  Refer to the "Links" section for chest pain algorithms and additional  guidance. Performed at [facility], [address], [place], [place] [address]   Brain natriuretic peptide     Status: Abnormal   Collection Time: 09/10/22  3:54 AM  Result Value Ref Range   B Natriuretic Peptide 176.5 (H) 0.0 - 100.0 pg/mL    Comment: Performed at [facility], [address] [place], [place] [address]  Creatinine, serum     Status: None   Collection Time: 09/10/22  3:58 AM  Result Value Ref Range   Creatinine, Ser 0.64 0.44 - 1.00 mg/dL   GFR, Estimated >32 >44 mL/min    Comment: (NOTE) Calculated using the CKD-EPI Creatinine Equation (2021) Performed at [facility], [address] [place], [place] [address]   Renal function panel     Status:  Abnormal   Collection Time: 09/10/22  7:09 AM  Result Value Ref Range   Sodium 137 135 - 145 mmol/L   Potassium 3.0 (L) 3.5 - 5.1 mmol/L   Chloride 92 (L) 98 - 111 mmol/L   CO2 31 22 - 32 mmol/L   Glucose, Bld 177 (H) 70 - 99 mg/dL    Comment: Glucose reference range applies only to samples taken after fasting for at least 8 hours.   BUN 9 8 - 23 mg/dL   Creatinine, Ser 2.53 0.44 - 1.00 mg/dL   Calcium 9.0 8.9 - 66.4 mg/dL   Phosphorus 2.8 2.5 - 4.6 mg/dL   Albumin 2.8 (L) 3.5 - 5.0 g/dL   GFR, Estimated >40 >34 mL/min    Comment: (NOTE) Calculated using the CKD-EPI Creatinine Equation (2021)    Anion gap 14 5 - 15    Comment: Performed at [facility], [address] [place], [place] [address]   CT Angio Chest PE W/Cm &/Or Wo Cm  Result Date: 09/10/2022 CLINICAL DATA:  History of colon carcinoma with recent port placement 11 days ago and persistent nausea and chest and abdominal pain, initial encounter EXAM: CT ANGIOGRAPHY CHEST CT ABDOMEN AND PELVIS WITH CONTRAST TECHNIQUE: Multidetector CT imaging of the chest was performed using the standard protocol during bolus administration of intravenous contrast. Multiplanar CT image reconstructions and MIPs were obtained to evaluate the vascular anatomy. Multidetector CT imaging of the abdomen and pelvis was performed using the standard protocol during bolus administration of intravenous contrast. RADIATION DOSE REDUCTION: This exam was performed  according to the departmental dose-optimization program which includes automated exposure control, adjustment of the mA and/or kV according to patient size and/or use of iterative reconstruction technique. CONTRAST:  75mL OMNIPAQUE IOHEXOL 350 MG/ML SOLN COMPARISON:  Chest x-ray from the previous day.  CT from 08/04/2018 FINDINGS: CTA CHEST FINDINGS Cardiovascular: Thoracic aorta demonstrates atherosclerotic calcifications without aneurysmal dilatation or dissection. No cardiac  enlargement is seen. Coronary calcifications are noted. The pulmonary artery shows a normal branching pattern bilaterally. No filling defect to suggest pulmonary embolism is noted. Mediastinum/Nodes: Thoracic inlet is within normal limits. Right-sided chest port is noted. No hilar or mediastinal adenopathy is noted. The esophagus as visualized is within normal limits. Lungs/Pleura: Large bilateral pleural effusions are noted right slightly greater than left. Associated lower lobe consolidation is seen. No parenchymal nodules are seen. No pneumothorax is noted. Musculoskeletal: No acute bony abnormality is noted. Review of the MIP images confirms the above findings. CT ABDOMEN and PELVIS FINDINGS Hepatobiliary: Liver is well visualized. Gallbladder is partially decompressed. Hypodensity seen in the posterior right lobe of the liver on the prior exam is not well appreciated on this exam. Pancreas: Unremarkable. No pancreatic ductal dilatation or surrounding inflammatory changes. Spleen: Normal in size without focal abnormality. Adrenals/Urinary Tract: Adrenal glands are within normal limits. Native kidneys are shrunken. Transplant kidney is noted in the right iliac fossa. No obstructive changes are seen. The bladder is well distended. Stomach/Bowel: Scattered diverticular change of the colon is noted. Changes are noted consistent with right hemicolectomy. Small bowel to colonic anastomosis is noted in the midline. No obstructive changes are seen. Small bowel and stomach show no obstructive change. Vascular/Lymphatic: Aortic atherosclerosis. No enlarged abdominal or pelvic lymph nodes. Reproductive: Uterus and bilateral adnexa are unremarkable. Other: Significant ascites is noted throughout the abdomen increased when compared with the prior exam. Multiple enhancing peritoneal implants are noted also increased when compared with the prior study consistent with progressive metastatic disease. Musculoskeletal: No acute or  significant osseous findings. Review of the MIP images confirms the above findings. IMPRESSION: CTA of the chest: No evidence of pulmonary emboli. Large bilateral pleural effusions right slightly greater than left. Associated lower lobe consolidation is noted. CT of the abdomen and pelvis: Changes consistent with the known right hemicolectomy. No obstructive changes at the anastomosis are seen. Significant increase in ascites and enhancing peritoneal implants consistent with progressive metastatic disease. Mesenteric involvement is also noted near the anastomotic site. Diverticulosis without diverticulitis. Right iliac fossa renal transplant. Electronically Signed   By: Alcide Clever M.D.   On: 09/10/2022 03:04   CT ABDOMEN PELVIS W CONTRAST  Result Date: 09/10/2022 CLINICAL DATA:  History of colon carcinoma with recent port placement 11 days ago and persistent nausea and chest and abdominal pain, initial encounter EXAM: CT ANGIOGRAPHY CHEST CT ABDOMEN AND PELVIS WITH CONTRAST TECHNIQUE: Multidetector CT imaging of the chest was performed using the standard protocol during bolus administration of intravenous contrast. Multiplanar CT image reconstructions and MIPs were obtained to evaluate the vascular anatomy. Multidetector CT imaging of the abdomen and pelvis was performed using the standard protocol during bolus administration of intravenous contrast. RADIATION DOSE REDUCTION: This exam was performed according to the departmental dose-optimization program which includes automated exposure control, adjustment of the mA and/or kV according to patient size and/or use of iterative reconstruction technique. CONTRAST:  75mL OMNIPAQUE IOHEXOL 350 MG/ML SOLN COMPARISON:  Chest x-ray from the previous day.  CT from 08/04/2018 FINDINGS: CTA CHEST FINDINGS Cardiovascular: Thoracic aorta demonstrates atherosclerotic  calcifications without aneurysmal dilatation or dissection. No cardiac enlargement is seen. Coronary  calcifications are noted. The pulmonary artery shows a normal branching pattern bilaterally. No filling defect to suggest pulmonary embolism is noted. Mediastinum/Nodes: Thoracic inlet is within normal limits. Right-sided chest port is noted. No hilar or mediastinal adenopathy is noted. The esophagus as visualized is within normal limits. Lungs/Pleura: Large bilateral pleural effusions are noted right slightly greater than left. Associated lower lobe consolidation is seen. No parenchymal nodules are seen. No pneumothorax is noted. Musculoskeletal: No acute bony abnormality is noted. Review of the MIP images confirms the above findings. CT ABDOMEN and PELVIS FINDINGS Hepatobiliary: Liver is well visualized. Gallbladder is partially decompressed. Hypodensity seen in the posterior right lobe of the liver on the prior exam is not well appreciated on this exam. Pancreas: Unremarkable. No pancreatic ductal dilatation or surrounding inflammatory changes. Spleen: Normal in size without focal abnormality. Adrenals/Urinary Tract: Adrenal glands are within normal limits. Native kidneys are shrunken. Transplant kidney is noted in the right iliac fossa. No obstructive changes are seen. The bladder is well distended. Stomach/Bowel: Scattered diverticular change of the colon is noted. Changes are noted consistent with right hemicolectomy. Small bowel to colonic anastomosis is noted in the midline. No obstructive changes are seen. Small bowel and stomach show no obstructive change. Vascular/Lymphatic: Aortic atherosclerosis. No enlarged abdominal or pelvic lymph nodes. Reproductive: Uterus and bilateral adnexa are unremarkable. Other: Significant ascites is noted throughout the abdomen increased when compared with the prior exam. Multiple enhancing peritoneal implants are noted also increased when compared with the prior study consistent with progressive metastatic disease. Musculoskeletal: No acute or significant osseous findings.  Review of the MIP images confirms the above findings. IMPRESSION: CTA of the chest: No evidence of pulmonary emboli. Large bilateral pleural effusions right slightly greater than left. Associated lower lobe consolidation is noted. CT of the abdomen and pelvis: Changes consistent with the known right hemicolectomy. No obstructive changes at the anastomosis are seen. Significant increase in ascites and enhancing peritoneal implants consistent with progressive metastatic disease. Mesenteric involvement is also noted near the anastomotic site. Diverticulosis without diverticulitis. Right iliac fossa renal transplant. Electronically Signed   By: Alcide Clever M.D.   On: 09/10/2022 03:04   DG Chest Port 1 View  Result Date: 09/09/2022 CLINICAL DATA:  History of colon cancer presenting with mid chest pain, indigestion and weakness. EXAM: PORTABLE CHEST 1 VIEW COMPARISON:  August 07, 2010 FINDINGS: The right-sided venous Port-A-Cath is seen with its distal tip overlying the distal aspect of the superior vena cava. The heart size and mediastinal contours are within normal limits. There is marked severity calcification of the aortic arch. Mild, layering bilateral pleural effusions are seen. Mild atelectasis is noted within the left lung base. No pneumothorax is identified. The visualized skeletal structures are unremarkable. IMPRESSION: Mild, layering bilateral pleural effusions with mild left basilar atelectasis. Electronically Signed   By: Aram Candela M.D.   On: 09/09/2022 22:56    Pending Labs Unresulted Labs (From admission, onward)     Start     Ordered   09/11/22 0500  Basic metabolic panel  Tomorrow morning,   R        09/10/22 0610   09/11/22 0500  Magnesium  Tomorrow morning,   R        09/10/22 0610   09/11/22 0500  CBC with Differential/Platelet  Tomorrow morning,   R        09/10/22 0639   09/10/22  1610  Culture, blood (Routine X 2) w Reflex to ID Panel  BLOOD CULTURE X 2,   R (with TIMED  occurrences)      09/10/22 0638   09/09/22 2306  Tacrolimus level  Once,   URGENT        09/09/22 2305            Vitals/Pain Today's Vitals   09/10/22 0830 09/10/22 0845 09/10/22 0939 09/10/22 1030  BP: (!) 171/100 (!) 167/99 (!) 145/111 (!) 155/91  Pulse: 91 94  99  Resp: 14 18  16   Temp:  97.7 F (36.5 C)    TempSrc:  Oral    SpO2: 95% 98%  97%  Weight:      Height:      PainSc:        Isolation Precautions No active isolations  Medications Medications  ondansetron (ZOFRAN) injection 4 mg (has no administration in time range)  hydrALAZINE (APRESOLINE) injection 5 mg (has no administration in time range)  acetaminophen (TYLENOL) tablet 650 mg (has no administration in time range)    Or  acetaminophen (TYLENOL) suppository 650 mg (has no administration in time range)  senna-docusate (Senokot-S) tablet 1 tablet (has no administration in time range)  cefTRIAXone (ROCEPHIN) 2 g in sodium chloride 0.9 % 100 mL IVPB (0 g Intravenous Stopped 09/10/22 0830)  azithromycin (ZITHROMAX) 500 mg in sodium chloride 0.9 % 250 mL IVPB (0 mg Intravenous Stopped 09/10/22 1035)  amLODipine (NORVASC) tablet 5 mg (5 mg Oral Given 09/10/22 0939)  cloNIDine (CATAPRES) tablet 0.1 mg (0.1 mg Oral Given 09/10/22 0940)  mycophenolate (MYFORTIC) EC tablet 180 mg (has no administration in time range)  predniSONE (DELTASONE) tablet 5 mg (5 mg Oral Given 09/10/22 0730)  tacrolimus (PROGRAF) capsule 3 mg (has no administration in time range)  potassium chloride 10 mEq in 100 mL IVPB (10 mEq Intravenous New Bag/Given 09/10/22 1056)  tacrolimus (PROGRAF) capsule 2 mg (has no administration in time range)  lactated ringers bolus 1,000 mL (0 mLs Intravenous Stopped 09/10/22 0208)  pantoprazole (PROTONIX) injection 40 mg (40 mg Intravenous Given 09/09/22 2319)  ondansetron (ZOFRAN) injection 4 mg (4 mg Intravenous Given 09/09/22 2319)  iohexol (OMNIPAQUE) 350 MG/ML injection 80 mL (75 mLs Intravenous Contrast  Given 09/10/22 0219)  amLODipine (NORVASC) tablet 5 mg (5 mg Oral Given 09/10/22 0109)  ondansetron (ZOFRAN) injection 4 mg (4 mg Intravenous Given 09/10/22 0310)  lactated ringers bolus 1,000 mL (0 mLs Intravenous Stopped 09/10/22 0503)  HYDROmorphone (DILAUDID) injection 1 mg (1 mg Intravenous Given 09/10/22 0400)  metoprolol tartrate (LOPRESSOR) injection 5 mg (5 mg Intravenous Given 09/10/22 0403)    Mobility walks with person assist      R Recommendations: See Admitting Provider Note  Report given to:   Additional Notes:

## 2022-09-11 ENCOUNTER — Other Ambulatory Visit (HOSPITAL_COMMUNITY): Payer: Medicare Other

## 2022-09-11 DIAGNOSIS — C189 Malignant neoplasm of colon, unspecified: Secondary | ICD-10-CM | POA: Diagnosis not present

## 2022-09-11 DIAGNOSIS — E876 Hypokalemia: Secondary | ICD-10-CM

## 2022-09-11 DIAGNOSIS — R112 Nausea with vomiting, unspecified: Secondary | ICD-10-CM | POA: Diagnosis not present

## 2022-09-11 DIAGNOSIS — J90 Pleural effusion, not elsewhere classified: Principal | ICD-10-CM

## 2022-09-11 DIAGNOSIS — J189 Pneumonia, unspecified organism: Secondary | ICD-10-CM | POA: Diagnosis not present

## 2022-09-11 DIAGNOSIS — I10 Essential (primary) hypertension: Secondary | ICD-10-CM

## 2022-09-11 DIAGNOSIS — R180 Malignant ascites: Secondary | ICD-10-CM

## 2022-09-11 LAB — CBC WITH DIFFERENTIAL/PLATELET
Abs Immature Granulocytes: 0.03 10*3/uL (ref 0.00–0.07)
Basophils Absolute: 0 10*3/uL (ref 0.0–0.1)
Basophils Relative: 1 %
Eosinophils Absolute: 0 10*3/uL (ref 0.0–0.5)
Eosinophils Relative: 0 %
HCT: 32.7 % — ABNORMAL LOW (ref 36.0–46.0)
Hemoglobin: 9.7 g/dL — ABNORMAL LOW (ref 12.0–15.0)
Immature Granulocytes: 0 %
Lymphocytes Relative: 19 %
Lymphs Abs: 1.4 10*3/uL (ref 0.7–4.0)
MCH: 26.9 pg (ref 26.0–34.0)
MCHC: 29.7 g/dL — ABNORMAL LOW (ref 30.0–36.0)
MCV: 90.8 fL (ref 80.0–100.0)
Monocytes Absolute: 0.8 10*3/uL (ref 0.1–1.0)
Monocytes Relative: 11 %
Neutro Abs: 5 10*3/uL (ref 1.7–7.7)
Neutrophils Relative %: 69 %
Platelets: 429 10*3/uL — ABNORMAL HIGH (ref 150–400)
RBC: 3.6 MIL/uL — ABNORMAL LOW (ref 3.87–5.11)
RDW: 15.8 % — ABNORMAL HIGH (ref 11.5–15.5)
WBC: 7.3 10*3/uL (ref 4.0–10.5)
nRBC: 0 % (ref 0.0–0.2)

## 2022-09-11 LAB — BASIC METABOLIC PANEL
Anion gap: 15 (ref 5–15)
BUN: 7 mg/dL — ABNORMAL LOW (ref 8–23)
CO2: 25 mmol/L (ref 22–32)
Calcium: 8 mg/dL — ABNORMAL LOW (ref 8.9–10.3)
Chloride: 95 mmol/L — ABNORMAL LOW (ref 98–111)
Creatinine, Ser: 0.6 mg/dL (ref 0.44–1.00)
GFR, Estimated: >60 mL/min (ref >60)
Glucose, Bld: 139 mg/dL — ABNORMAL HIGH (ref 70–99)
Potassium: 2.8 mmol/L — ABNORMAL LOW (ref 3.5–5.1)
Sodium: 135 mmol/L (ref 135–145)

## 2022-09-11 LAB — GLUCOSE, CAPILLARY: Glucose-Capillary: 194 mg/dL — ABNORMAL HIGH (ref 70–99)

## 2022-09-11 LAB — MAGNESIUM: Magnesium: 1.6 mg/dL — ABNORMAL LOW (ref 1.7–2.4)

## 2022-09-11 MED ORDER — SODIUM CHLORIDE 0.9% FLUSH: 10.0000 mL | INTRAVENOUS | Status: DC | PRN

## 2022-09-11 MED ORDER — SODIUM CHLORIDE 0.9 % IV SOLN
2.0000 g | Freq: Two times a day (BID) | INTRAVENOUS | Status: DC
  Administered 2022-09-11 – 2022-09-13 (×5): 2 g via INTRAVENOUS
  Filled 2022-09-11 (×5): qty 12.5

## 2022-09-11 MED ORDER — PROCHLORPERAZINE EDISYLATE 10 MG/2ML IJ SOLN
5.0000 mg | Freq: Four times a day (QID) | INTRAMUSCULAR | Status: DC | PRN
  Administered 2022-09-11 – 2022-09-12 (×2): 5 mg via INTRAVENOUS
  Filled 2022-09-11 (×3): qty 2

## 2022-09-11 MED ORDER — MAGNESIUM SULFATE 4 GM/100ML IV SOLN
4.0000 g | Freq: Once | INTRAVENOUS | Status: AC
  Administered 2022-09-11: 4 g via INTRAVENOUS
  Filled 2022-09-11: qty 100

## 2022-09-11 MED ORDER — POTASSIUM CHLORIDE 10 MEQ/100ML IV SOLN
10.0000 meq | INTRAVENOUS | Status: AC
  Administered 2022-09-11 (×3): 10 meq via INTRAVENOUS
  Filled 2022-09-11 (×3): qty 100

## 2022-09-11 MED ORDER — HYDROCODONE-ACETAMINOPHEN 5-325 MG PO TABS
1.0000 | ORAL_TABLET | Freq: Once | ORAL | Status: AC
  Administered 2022-09-11: 1 via ORAL
  Filled 2022-09-11: qty 1

## 2022-09-11 MED ORDER — POTASSIUM CHLORIDE 10 MEQ/100ML IV SOLN
10.0000 meq | INTRAVENOUS | Status: AC
  Administered 2022-09-11 (×2): 10 meq via INTRAVENOUS
  Filled 2022-09-11 (×2): qty 100

## 2022-09-11 MED ORDER — ALBUMIN HUMAN 25 % IV SOLN
25.0000 g | Freq: Once | INTRAVENOUS | Status: AC
  Administered 2022-09-12: 25 g via INTRAVENOUS
  Filled 2022-09-11: qty 100

## 2022-09-11 MED ORDER — INSULIN ASPART 100 UNIT/ML IJ SOLN
0.0000 [IU] | Freq: Three times a day (TID) | INTRAMUSCULAR | Status: DC
  Administered 2022-09-12 (×2): 2 [IU] via SUBCUTANEOUS
  Administered 2022-09-12: 3 [IU] via SUBCUTANEOUS
  Administered 2022-09-13 (×2): 2 [IU] via SUBCUTANEOUS

## 2022-09-11 MED ORDER — CHLORHEXIDINE GLUCONATE CLOTH 2 % EX PADS
6.0000 | MEDICATED_PAD | Freq: Every day | CUTANEOUS | Status: DC
  Administered 2022-09-11 – 2022-09-13 (×3): 6 via TOPICAL

## 2022-09-11 MED ORDER — LACTATED RINGERS IV SOLN: INTRAVENOUS | Status: DC

## 2022-09-11 MED ORDER — VITAMIN D 25 MCG (1000 UNIT) PO TABS
2000.0000 [IU] | ORAL_TABLET | Freq: Every day | ORAL | Status: DC
  Administered 2022-09-12 – 2022-09-13 (×2): 2000 [IU] via ORAL
  Filled 2022-09-11 (×2): qty 2

## 2022-09-11 MED ORDER — SODIUM CHLORIDE 0.9% FLUSH
10.0000 mL | Freq: Two times a day (BID) | INTRAVENOUS | Status: DC
  Administered 2022-09-11 – 2022-09-13 (×5): 10 mL

## 2022-09-11 MED ORDER — ENOXAPARIN SODIUM 30 MG/0.3ML IJ SOSY
30.0000 mg | PREFILLED_SYRINGE | INTRAMUSCULAR | Status: DC
  Administered 2022-09-11 – 2022-09-12 (×2): 30 mg via SUBCUTANEOUS
  Filled 2022-09-11 (×2): qty 0.3

## 2022-09-11 MED ORDER — PROCHLORPERAZINE EDISYLATE 10 MG/2ML IJ SOLN
5.0000 mg | Freq: Once | INTRAMUSCULAR | Status: AC
  Administered 2022-09-11: 5 mg via INTRAVENOUS
  Filled 2022-09-11: qty 2

## 2022-09-11 MED ORDER — ALUM & MAG HYDROXIDE-SIMETH 200-200-20 MG/5ML PO SUSP
30.0000 mL | Freq: Once | ORAL | Status: AC
  Administered 2022-09-11: 30 mL via ORAL
  Filled 2022-09-11: qty 30

## 2022-09-11 MED ORDER — ATORVASTATIN CALCIUM 10 MG PO TABS
10.0000 mg | ORAL_TABLET | ORAL | Status: DC
  Administered 2022-09-13: 10 mg via ORAL
  Filled 2022-09-11: qty 1

## 2022-09-11 MED ORDER — LIDOCAINE VISCOUS HCL 2 % MT SOLN
15.0000 mL | Freq: Once | OROMUCOSAL | Status: AC
  Administered 2022-09-11: 15 mL via ORAL
  Filled 2022-09-11: qty 15

## 2022-09-11 NOTE — Progress Notes (Signed)
PROGRESS NOTE    [name]  BMW:[id number] DOB: [birth year] DOA: 09/09/2022 PCP: Mila Palmer, MD    Chief Complaint  Patient presents with   Heartburn    Brief Narrative:  Patient is 77 year old female history of renal transplant 2013, hyperlipidemia, type 2 diabetes, hypertension, recent diagnosis of metastatic colon cancer status post hemicolectomy 09/03/2022 and scheduled to start chemotherapy soon.  Patient presented to the ED with nausea vomiting, tachycardia and acid in her body.  CT angiogram chest done was negative for PE however showed large bilateral pleural effusions right slightly greater than left.  Associated lower lobe consolidation also noted.  CT abdomen and pelvis done with changes consistent with known right hemicolectomy.  No obstructive changes at the anastomosis is seen, significant increase in ascites and enhancing peritoneal implants consistent with progressive metastatic disease.   Assessment & Plan:   Principal Problem:   Nausea and vomiting Active Problems:   Type 2 diabetes mellitus with complication (HCC)   Essential hypertension   Immunosuppression due to drug therapy for kidney transplant   Nausea & vomiting   Colon cancer (HCC)   Pneumonia   Malignant ascites   Pleural effusion  #1 left lower lobe consolidation/immunocompromise patient -Patient presenting with nausea vomiting and not feeling well with complaints of reflux. -CT angiogram chest done negative for PE however showed bilateral large pleural effusions right slightly greater than left, associated lower lobe consolidation also noted. -Blood cultures ordered and pending. -Continue azithromycin. -Discontinue Rocephin and place on IV cefepime. -Continue nebs as needed.  2.  Nausea and vomiting -Likely secondary to metastatic colon cancer. -Continue IV antiemetics, supportive care. -Patient insistent on diet being advanced today and was placed on a regular diet which she feels she  is tolerating at this time. -Supportive care.  3.  Abdominal ascites/bilateral large pleural effusions -Concern likely malignant in nature. -Patient with signs of 97% on room air. -Diagnostic and therapeutic ultrasound-guided paracentesis. -Follow for now.  4.  Metastatic colon cancer -Status post hemicolectomy. -Outpatient follow-up with oncology as scheduled in September to discuss chemotherapy.  5.  Hypertension -Continue Norvasc, clonidine.  6.  Type 2 diabetes mellitus -Hemoglobin A1c 8.1 (08/02/2022) -Check CBGs, placed on SSI.  7.  Hyperlipidemia -Statin.  8.  History of renal transplant -Continue Prograf and home regimen prednisone.. -Discontinue mycophenolate in lieu of probable pneumonia/infection.  9.  Hypokalemia/hypomagnesemia -Replete. -Repeat labs in the AM.   DVT prophylaxis: Lovenox Code Status: Full Family Communication: Updated husband, brother, son at bedside. Disposition: TBD  Status is: Inpatient Remains inpatient appropriate because: Severity of illness   Consultants:  Curb sided oncology.  Procedures:  CT angiogram chest 09/10/2022 CT abdomen and pelvis 09/10/2022 Chest x-ray 09/09/2022   Antimicrobials:  Anti-infectives (From admission, onward)    Start     Dose/Rate Route Frequency Ordered Stop   09/11/22 1000  ceFEPIme (MAXIPIME) 2 g in sodium chloride 0.9 % 100 mL IVPB        2 g 200 mL/hr over 30 Minutes Intravenous Every 12 hours 09/11/22 0906     09/10/22 0800  cefTRIAXone (ROCEPHIN) 2 g in sodium chloride 0.9 % 100 mL IVPB  Status:  Discontinued        2 g 200 mL/hr over 30 Minutes Intravenous Every 24 hours 09/10/22 0639 09/11/22 0835   09/10/22 0800  azithromycin (ZITHROMAX) 500 mg in sodium chloride 0.9 % 250 mL IVPB        500 mg 250 mL/hr over 60 Minutes  Intravenous Every 24 hours 09/10/22 0639 09/15/22 0759         Subjective: Sitting up in bed with emesis bag close by.  Does endorse some nausea but denies any  further vomiting this morning.  Was asking for diet to be advanced and stated she was able to tolerate some cereal this morning.  Son, husband, brother at bedside.  Objective: Vitals:   09/11/22 0123 09/11/22 0512 09/11/22 1316 09/11/22 1925  BP: (!) 141/96 136/80 (!) 156/139 (!) 157/86  Pulse:   (!) 109   Resp: 16 15 16 15   Temp: 97.8 F (36.6 C) (!) 97.3 F (36.3 C) 98.5 F (36.9 C) 97.7 F (36.5 C)  TempSrc:   Oral Oral  SpO2: 95% 100% 96% 97%  Weight:      Height:        Intake/Output Summary (Last 24 hours) at 09/11/2022 2137 Last data filed at 09/11/2022 1607 Gross per 24 hour  Intake 387.21 ml  Output --  Net 387.21 ml   Filed Weights   09/09/22 2104  Weight: 54.4 kg    Examination:  General exam: Appears calm and comfortable  Respiratory system: Clear to auscultation.  No wheezes, no crackles, no rhonchi.  Fair air movement.  Speaking in full sentences.  Respiratory effort normal. Cardiovascular system: S1 & S2 heard, RRR. No JVD, murmurs, rubs, gallops or clicks. No pedal edema. Gastrointestinal system: Abdomen is nondistended, soft and nontender. No organomegaly or masses felt. Normal bowel sounds heard. Central nervous system: Alert and oriented. No focal neurological deficits. Extremities: Symmetric 5 x 5 power. Skin: No rashes, lesions or ulcers Psychiatry: Judgement and insight appear normal. Mood & affect appropriate.     Data Reviewed: I have personally reviewed following labs and imaging studies  CBC: Recent Labs  Lab 09/09/22 2303 09/11/22 0537  WBC 8.4 7.3  NEUTROABS 6.2 5.0  HGB 9.9* 9.7*  HCT 31.7* 32.7*  MCV 87.6 90.8  PLT 481* 429*    Basic Metabolic Panel: Recent Labs  Lab 09/09/22 2303 09/10/22 0358 09/10/22 0709 09/11/22 0537  NA 136  --  137 135  K 3.2*  --  3.0* 2.8*  CL 92*  --  92* 95*  CO2 29  --  31 25  GLUCOSE 173*  --  177* 139*  BUN 15  --  9 7*  CREATININE 0.80 0.64 0.52 0.60  CALCIUM 9.3  --  9.0 8.0*  MG 1.9   --   --  1.6*  PHOS  --   --  2.8  --     GFR: Estimated Creatinine Clearance: 44.3 mL/min (by C-G formula based on SCr of 0.6 mg/dL).  Liver Function Tests: Recent Labs  Lab 09/09/22 2303 09/10/22 0709  AST 18  --   ALT 10  --   ALKPHOS 47  --   BILITOT 0.8  --   PROT 6.5  --   ALBUMIN 2.7* 2.8*    CBG: No results for input(s): "GLUCAP" in the last 168 hours.   Recent Results (from the past 240 hour(s))  Culture, blood (Routine X 2) w Reflex to ID Panel     Status: None (Preliminary result)   Collection Time: 09/10/22  7:02 AM   Specimen: BLOOD  Result Value Ref Range Status   Specimen Description   Final    BLOOD RIGHT ANTECUBITAL Performed at [facility], [address], [place], [place] [address]    Special Requests   Final  BOTTLES DRAWN AEROBIC AND ANAEROBIC Blood Culture adequate volume Performed at [facility], [address], [place], [place] [address]    Culture   Final    NO GROWTH < 24 HOURS Performed at [facility], [address], [place], [place] [address]    Report Status PENDING  Incomplete  Culture, blood (Routine X 2) w Reflex to ID Panel     Status: None (Preliminary result)   Collection Time: 09/10/22  7:09 AM   Specimen: BLOOD RIGHT ARM  Result Value Ref Range Status   Specimen Description   Final    BLOOD RIGHT ARM Performed at [facility], [address], [place], [place] [address]    Special Requests   Final    BOTTLES DRAWN AEROBIC AND ANAEROBIC Blood Culture results may not be optimal due to an inadequate volume of blood received in culture bottles Performed at [facility], [address], [place], [place] [address]    Culture   Final    NO GROWTH < 24 HOURS Performed at [facility], [address] [place], [place] [address]    Report Status PENDING  Incomplete         Radiology Studies: CT Angio Chest PE W/Cm &/Or Wo  Cm  Result Date: 09/10/2022 CLINICAL DATA:  History of colon carcinoma with recent port placement 11 days ago and persistent nausea and chest and abdominal pain, initial encounter EXAM: CT ANGIOGRAPHY CHEST CT ABDOMEN AND PELVIS WITH CONTRAST TECHNIQUE: Multidetector CT imaging of the chest was performed using the standard protocol during bolus administration of intravenous contrast. Multiplanar CT image reconstructions and MIPs were obtained to evaluate the vascular anatomy. Multidetector CT imaging of the abdomen and pelvis was performed using the standard protocol during bolus administration of intravenous contrast. RADIATION DOSE REDUCTION: This exam was performed according to the departmental dose-optimization program which includes automated exposure control, adjustment of the mA and/or kV according to patient size and/or use of iterative reconstruction technique. CONTRAST:  75mL OMNIPAQUE IOHEXOL 350 MG/ML SOLN COMPARISON:  Chest x-ray from the previous day.  CT from 08/04/2018 FINDINGS: CTA CHEST FINDINGS Cardiovascular: Thoracic aorta demonstrates atherosclerotic calcifications without aneurysmal dilatation or dissection. No cardiac enlargement is seen. Coronary calcifications are noted. The pulmonary artery shows a normal branching pattern bilaterally. No filling defect to suggest pulmonary embolism is noted. Mediastinum/Nodes: Thoracic inlet is within normal limits. Right-sided chest port is noted. No hilar or mediastinal adenopathy is noted. The esophagus as visualized is within normal limits. Lungs/Pleura: Large bilateral pleural effusions are noted right slightly greater than left. Associated lower lobe consolidation is seen. No parenchymal nodules are seen. No pneumothorax is noted. Musculoskeletal: No acute bony abnormality is noted. Review of the MIP images confirms the above findings. CT ABDOMEN and PELVIS FINDINGS Hepatobiliary: Liver is well visualized. Gallbladder is partially decompressed.  Hypodensity seen in the posterior right lobe of the liver on the prior exam is not well appreciated on this exam. Pancreas: Unremarkable. No pancreatic ductal dilatation or surrounding inflammatory changes. Spleen: Normal in size without focal abnormality. Adrenals/Urinary Tract: Adrenal glands are within normal limits. Native kidneys are shrunken. Transplant kidney is noted in the right iliac fossa. No obstructive changes are seen. The bladder is well distended. Stomach/Bowel: Scattered diverticular change of the colon is noted. Changes are noted consistent with right hemicolectomy. Small bowel to colonic anastomosis is noted in the midline. No obstructive changes are seen. Small bowel and stomach  show no obstructive change. Vascular/Lymphatic: Aortic atherosclerosis. No enlarged abdominal or pelvic lymph nodes. Reproductive: Uterus and bilateral adnexa are unremarkable. Other: Significant ascites is noted throughout the abdomen increased when compared with the prior exam. Multiple enhancing peritoneal implants are noted also increased when compared with the prior study consistent with progressive metastatic disease. Musculoskeletal: No acute or significant osseous findings. Review of the MIP images confirms the above findings. IMPRESSION: CTA of the chest: No evidence of pulmonary emboli. Large bilateral pleural effusions right slightly greater than left. Associated lower lobe consolidation is noted. CT of the abdomen and pelvis: Changes consistent with the known right hemicolectomy. No obstructive changes at the anastomosis are seen. Significant increase in ascites and enhancing peritoneal implants consistent with progressive metastatic disease. Mesenteric involvement is also noted near the anastomotic site. Diverticulosis without diverticulitis. Right iliac fossa renal transplant. Electronically Signed   By: Alcide Clever M.D.   On: 09/10/2022 03:04   CT ABDOMEN PELVIS W CONTRAST  Result Date:  09/10/2022 CLINICAL DATA:  History of colon carcinoma with recent port placement 11 days ago and persistent nausea and chest and abdominal pain, initial encounter EXAM: CT ANGIOGRAPHY CHEST CT ABDOMEN AND PELVIS WITH CONTRAST TECHNIQUE: Multidetector CT imaging of the chest was performed using the standard protocol during bolus administration of intravenous contrast. Multiplanar CT image reconstructions and MIPs were obtained to evaluate the vascular anatomy. Multidetector CT imaging of the abdomen and pelvis was performed using the standard protocol during bolus administration of intravenous contrast. RADIATION DOSE REDUCTION: This exam was performed according to the departmental dose-optimization program which includes automated exposure control, adjustment of the mA and/or kV according to patient size and/or use of iterative reconstruction technique. CONTRAST:  75mL OMNIPAQUE IOHEXOL 350 MG/ML SOLN COMPARISON:  Chest x-ray from the previous day.  CT from 08/04/2018 FINDINGS: CTA CHEST FINDINGS Cardiovascular: Thoracic aorta demonstrates atherosclerotic calcifications without aneurysmal dilatation or dissection. No cardiac enlargement is seen. Coronary calcifications are noted. The pulmonary artery shows a normal branching pattern bilaterally. No filling defect to suggest pulmonary embolism is noted. Mediastinum/Nodes: Thoracic inlet is within normal limits. Right-sided chest port is noted. No hilar or mediastinal adenopathy is noted. The esophagus as visualized is within normal limits. Lungs/Pleura: Large bilateral pleural effusions are noted right slightly greater than left. Associated lower lobe consolidation is seen. No parenchymal nodules are seen. No pneumothorax is noted. Musculoskeletal: No acute bony abnormality is noted. Review of the MIP images confirms the above findings. CT ABDOMEN and PELVIS FINDINGS Hepatobiliary: Liver is well visualized. Gallbladder is partially decompressed. Hypodensity seen in  the posterior right lobe of the liver on the prior exam is not well appreciated on this exam. Pancreas: Unremarkable. No pancreatic ductal dilatation or surrounding inflammatory changes. Spleen: Normal in size without focal abnormality. Adrenals/Urinary Tract: Adrenal glands are within normal limits. Native kidneys are shrunken. Transplant kidney is noted in the right iliac fossa. No obstructive changes are seen. The bladder is well distended. Stomach/Bowel: Scattered diverticular change of the colon is noted. Changes are noted consistent with right hemicolectomy. Small bowel to colonic anastomosis is noted in the midline. No obstructive changes are seen. Small bowel and stomach show no obstructive change. Vascular/Lymphatic: Aortic atherosclerosis. No enlarged abdominal or pelvic lymph nodes. Reproductive: Uterus and bilateral adnexa are unremarkable. Other: Significant ascites is noted throughout the abdomen increased when compared with the prior exam. Multiple enhancing peritoneal implants are noted also increased when compared with the prior study consistent with progressive metastatic disease. Musculoskeletal:  No acute or significant osseous findings. Review of the MIP images confirms the above findings. IMPRESSION: CTA of the chest: No evidence of pulmonary emboli. Large bilateral pleural effusions right slightly greater than left. Associated lower lobe consolidation is noted. CT of the abdomen and pelvis: Changes consistent with the known right hemicolectomy. No obstructive changes at the anastomosis are seen. Significant increase in ascites and enhancing peritoneal implants consistent with progressive metastatic disease. Mesenteric involvement is also noted near the anastomotic site. Diverticulosis without diverticulitis. Right iliac fossa renal transplant. Electronically Signed   By: Alcide Clever M.D.   On: 09/10/2022 03:04   DG Chest Port 1 View  Result Date: 09/09/2022 CLINICAL DATA:  History of colon  cancer presenting with mid chest pain, indigestion and weakness. EXAM: PORTABLE CHEST 1 VIEW COMPARISON:  August 07, 2010 FINDINGS: The right-sided venous Port-A-Cath is seen with its distal tip overlying the distal aspect of the superior vena cava. The heart size and mediastinal contours are within normal limits. There is marked severity calcification of the aortic arch. Mild, layering bilateral pleural effusions are seen. Mild atelectasis is noted within the left lung base. No pneumothorax is identified. The visualized skeletal structures are unremarkable. IMPRESSION: Mild, layering bilateral pleural effusions with mild left basilar atelectasis. Electronically Signed   By: Aram Candela M.D.   On: 09/09/2022 22:56        Scheduled Meds:  alum & mag hydroxide-simeth  30 mL Oral Once   And   lidocaine  15 mL Oral Once   amLODipine  5 mg Oral Daily   atorvastatin  10 mg Oral See admin instructions   Chlorhexidine Gluconate Cloth  6 each Topical Daily   cloNIDine  0.1 mg Oral BID   enoxaparin (LOVENOX) injection  30 mg Subcutaneous Q24H   [START ON 09/12/2022] insulin aspart  0-9 Units Subcutaneous TID WC   predniSONE  5 mg Oral Q breakfast   sodium chloride flush  10-40 mL Intracatheter Q12H   tacrolimus  2 mg Oral QHS   tacrolimus  3 mg Oral Daily   [START ON 09/12/2022] Vitamin D3  2,000 Units Oral Q breakfast   Continuous Infusions:  albumin human     azithromycin Stopped (09/10/22 0936)   ceFEPime (MAXIPIME) IV 2 g (09/11/22 2109)   lactated ringers 100 mL/hr at 09/11/22 1500     LOS: 1 day    Time spent: 40 minutes    Ramiro Harvest, MD [facility]   To contact the attending provider between 7A-7P or the covering provider during after hours 7P-7A, please log into the web site [email or web address] and access using universal [place] password for that web site. If you do not have the password, please call the hospital operator.  09/11/2022, 9:37 PM

## 2022-09-11 NOTE — Plan of Care (Signed)

## 2022-09-11 NOTE — Progress Notes (Signed)
   09/11/22 1436  TOC Brief Assessment  Insurance and Status Reviewed  Patient has primary care physician Yes  Home environment has been reviewed Home w/ spouse  Prior level of function: Independent  Prior/Current Home Services No current home services  Social Determinants of Health Reivew SDOH reviewed no interventions necessary  Readmission risk has been reviewed Yes  Transition of care needs no transition of care needs at this time

## 2022-09-11 NOTE — Progress Notes (Addendum)
Patient loss IV access this LPN asked patient for permission to start an IV access, patient refused. Further more patient states that she's going home as soon as husband gets here there is no point of getting another IV and refuses care until doctor comes at bedside and speak with her.  Rodolph Bong, MD Notified

## 2022-09-11 NOTE — Progress Notes (Signed)
Pharmacy Antibiotic Note  [name] is a 77 y.o. female admitted on 09/09/2022 with pneumonia.  Pharmacy has been consulted for Cefepime dosing.  Plan: Cefepime 2g IV q12h  Follow up renal function, culture results, and clinical course.   Height: 4\' 11"  (149.9 cm) Weight: 54.4 kg (120 lb) IBW/kg (Calculated) : 43.2  Temp (24hrs), Avg:97.7 F (36.5 C), Min:97.3 F (36.3 C), Max:98.1 F (36.7 C)  Recent Labs  Lab 09/09/22 2303 09/10/22 0358 09/10/22 0709 09/11/22 0537  WBC 8.4  --   --  7.3  CREATININE 0.80 0.64 0.52 0.60    Estimated Creatinine Clearance: 44.3 mL/min (by C-G formula based on SCr of 0.6 mg/dL).    Allergies  Allergen Reactions   Amoxicillin-Pot Clavulanate Nausea And Vomiting   Aspirin Nausea And Vomiting and Other (See Comments)    Dizziness, GI Intolerance, Ringing in ears   Ciprofloxacin Other (See Comments)    Dizziness   Fexofenadine Other (See Comments)    Dizziness   Ibuprofen Nausea And Vomiting and Other (See Comments)    Ringing in ears also   Nitrofurantoin Nausea Only and Other (See Comments)    GI Intolerance   Nitrofurantoin Macrocrystal Nausea Only   Simvastatin Other (See Comments)    Muscle aches    Antimicrobials this admission: 8/27 Azithromycin >>  8/27 ceftriaxone >> 8/28 8/28 Cefepime >>   Dose adjustments this admission:   Microbiology results: 8/27 BCx:   Thank you for allowing pharmacy to be a part of this patient's care.  Lynann Beaver PharmD, BCPS [facility] main pharmacy [phone] 09/11/2022 9:06 AM

## 2022-09-12 ENCOUNTER — Inpatient Hospital Stay (HOSPITAL_COMMUNITY): Payer: Medicare Other

## 2022-09-12 DIAGNOSIS — J90 Pleural effusion, not elsewhere classified: Secondary | ICD-10-CM | POA: Diagnosis not present

## 2022-09-12 DIAGNOSIS — C189 Malignant neoplasm of colon, unspecified: Secondary | ICD-10-CM | POA: Diagnosis not present

## 2022-09-12 DIAGNOSIS — J189 Pneumonia, unspecified organism: Secondary | ICD-10-CM | POA: Diagnosis not present

## 2022-09-12 DIAGNOSIS — R112 Nausea with vomiting, unspecified: Secondary | ICD-10-CM | POA: Diagnosis not present

## 2022-09-12 LAB — CBC WITH DIFFERENTIAL/PLATELET
Abs Immature Granulocytes: 0.04 10*3/uL (ref 0.00–0.07)
Basophils Absolute: 0 10*3/uL (ref 0.0–0.1)
Basophils Relative: 0 %
Eosinophils Absolute: 0 10*3/uL (ref 0.0–0.5)
Eosinophils Relative: 0 %
HCT: 29 % — ABNORMAL LOW (ref 36.0–46.0)
Hemoglobin: 9.1 g/dL — ABNORMAL LOW (ref 12.0–15.0)
Immature Granulocytes: 0 %
Lymphocytes Relative: 14 %
Lymphs Abs: 1.4 10*3/uL (ref 0.7–4.0)
MCH: 27.8 pg (ref 26.0–34.0)
MCHC: 31.4 g/dL (ref 30.0–36.0)
MCV: 88.7 fL (ref 80.0–100.0)
Monocytes Absolute: 0.8 10*3/uL (ref 0.1–1.0)
Monocytes Relative: 8 %
Neutro Abs: 7.7 10*3/uL (ref 1.7–7.7)
Neutrophils Relative %: 78 %
Platelets: 422 10*3/uL — ABNORMAL HIGH (ref 150–400)
RBC: 3.27 MIL/uL — ABNORMAL LOW (ref 3.87–5.11)
RDW: 15.9 % — ABNORMAL HIGH (ref 11.5–15.5)
WBC: 9.9 10*3/uL (ref 4.0–10.5)
nRBC: 0 % (ref 0.0–0.2)

## 2022-09-12 LAB — PROTEIN, PLEURAL OR PERITONEAL FLUID: Total protein, fluid: 3.4 g/dL

## 2022-09-12 LAB — RENAL FUNCTION PANEL
Albumin: 2.1 g/dL — ABNORMAL LOW (ref 3.5–5.0)
Anion gap: 15 (ref 5–15)
BUN: 11 mg/dL (ref 8–23)
CO2: 21 mmol/L — ABNORMAL LOW (ref 22–32)
Calcium: 7.8 mg/dL — ABNORMAL LOW (ref 8.9–10.3)
Chloride: 94 mmol/L — ABNORMAL LOW (ref 98–111)
Creatinine, Ser: 0.75 mg/dL (ref 0.44–1.00)
GFR, Estimated: >60 mL/min (ref >60)
Glucose, Bld: 151 mg/dL — ABNORMAL HIGH (ref 70–99)
Phosphorus: 2.3 mg/dL — ABNORMAL LOW (ref 2.5–4.6)
Potassium: 3.7 mmol/L (ref 3.5–5.1)
Sodium: 130 mmol/L — ABNORMAL LOW (ref 135–145)

## 2022-09-12 LAB — GLUCOSE, CAPILLARY
Glucose-Capillary: 155 mg/dL — ABNORMAL HIGH (ref 70–99)
Glucose-Capillary: 188 mg/dL — ABNORMAL HIGH (ref 70–99)
Glucose-Capillary: 226 mg/dL — ABNORMAL HIGH (ref 70–99)
Glucose-Capillary: 148 mg/dL — ABNORMAL HIGH (ref 70–99)

## 2022-09-12 LAB — IRON AND TIBC
Iron: 19 ug/dL — ABNORMAL LOW (ref 28–170)
Saturation Ratios: 10 % — ABNORMAL LOW (ref 10.4–31.8)
TIBC: 190 ug/dL — ABNORMAL LOW (ref 250–450)
UIBC: 171 ug/dL

## 2022-09-12 LAB — BODY FLUID CELL COUNT WITH DIFFERENTIAL
Eos, Fluid: 0 %
Lymphs, Fluid: 39 %
Monocyte-Macrophage-Serous Fluid: 55 % (ref 50–90)
Neutrophil Count, Fluid: 2 % (ref 0–25)
Other Cells, Fluid: 4 %
Total Nucleated Cell Count, Fluid: 218 cu mm (ref 0–1000)

## 2022-09-12 LAB — BRAIN NATRIURETIC PEPTIDE: B Natriuretic Peptide: 167.9 pg/mL — ABNORMAL HIGH (ref 0.0–100.0)

## 2022-09-12 LAB — ALBUMIN, PLEURAL OR PERITONEAL FLUID: Albumin, Fluid: 1.6 g/dL

## 2022-09-12 LAB — VITAMIN B12: Vitamin B-12: 409 pg/mL (ref 180–914)

## 2022-09-12 LAB — GLUCOSE, PLEURAL OR PERITONEAL FLUID: Glucose, Fluid: 187 mg/dL

## 2022-09-12 LAB — MAGNESIUM: Magnesium: 2.3 mg/dL (ref 1.7–2.4)

## 2022-09-12 LAB — FERRITIN: Ferritin: 82 ng/mL (ref 11–307)

## 2022-09-12 LAB — TACROLIMUS LEVEL: Tacrolimus (FK506) - LabCorp: 4.2 ng/mL (ref 2.0–20.0)

## 2022-09-12 LAB — FOLATE: Folate: 9.7 ng/mL (ref >5.9)

## 2022-09-12 MED ORDER — FUROSEMIDE 10 MG/ML IJ SOLN
20.0000 mg | Freq: Once | INTRAMUSCULAR | Status: AC
  Administered 2022-09-12: 20 mg via INTRAVENOUS
  Filled 2022-09-12: qty 2

## 2022-09-12 MED ORDER — POTASSIUM PHOSPHATES 15 MMOLE/5ML IV SOLN
30.0000 mmol | Freq: Once | INTRAVENOUS | Status: AC
  Administered 2022-09-12: 30 mmol via INTRAVENOUS
  Filled 2022-09-12: qty 10

## 2022-09-12 MED ORDER — LIDOCAINE HCL 1 % IJ SOLN
INTRAMUSCULAR | Status: AC
  Filled 2022-09-12: qty 20

## 2022-09-12 NOTE — Procedures (Signed)
PROCEDURE SUMMARY:  Successful US guided paracentesis from right abdomen.  Yielded 2.5 L of clear yellow fluid.  No immediate complications.  Pt tolerated well.   Specimen sent for labs.  EBL < 2 mL  Mickie Kay, NP 09/12/2022 3:09 PM

## 2022-09-12 NOTE — Progress Notes (Signed)
PROGRESS NOTE    [name]  WJX:[id number] DOB: [birth year] DOA: 09/09/2022 PCP: Mila Palmer, MD    Chief Complaint  Patient presents with   Heartburn    Brief Narrative:  Patient is 77 year old female history of renal transplant 2013, hyperlipidemia, type 2 diabetes, hypertension, recent diagnosis of metastatic colon cancer status post hemicolectomy 09/03/2022 and scheduled to start chemotherapy soon.  Patient presented to the ED with nausea vomiting, tachycardia and acid in her body.  CT angiogram chest done was negative for PE however showed large bilateral pleural effusions right slightly greater than left.  Associated lower lobe consolidation also noted.  CT abdomen and pelvis done with changes consistent with known right hemicolectomy.  No obstructive changes at the anastomosis is seen, significant increase in ascites and enhancing peritoneal implants consistent with progressive metastatic disease.   Assessment & Plan:   Principal Problem:   Nausea and vomiting Active Problems:   Type 2 diabetes mellitus with complication (HCC)   Essential hypertension   Immunosuppression due to drug therapy for kidney transplant   Nausea & vomiting   Colon cancer (HCC)   Pneumonia   Malignant ascites   Pleural effusion  #1 left lower lobe consolidation/immunocompromised patient -Patient presenting with nausea vomiting and not feeling well with complaints of reflux. -CT angiogram chest done negative for PE however showed bilateral large pleural effusions right slightly greater than left, associated lower lobe consolidation also noted. -Blood cultures ordered and pending. -Continue azithromycin and cefepime -Nebs as needed.  2.  Nausea and vomiting -Likely secondary to metastatic colon cancer. -Slowly improving clinically. -Tolerating regular diet. -Continue IV antiemetics, supportive care.  3.  Abdominal ascites/bilateral large pleural effusions -Concern likely malignant in  nature. -Patient with signs of 99-100% on room air. -Diagnostic and therapeutic ultrasound-guided paracentesis ordered and pending hopefully will be done this afternoon.. -Follow for now.  4.  Metastatic colon cancer -Status post hemicolectomy. -Outpatient follow-up with oncology as scheduled in September to discuss chemotherapy.  5.  Hypertension -Clonidine, Norvasc.   6.  Type 2 diabetes mellitus -Hemoglobin A1c 8.1 (08/02/2022) -CBG 155 this morning.   -SSI.   7.  Hyperlipidemia -Continue statin.   8.  History of renal transplant -Continue Prograf and home regimen prednisone.. -Discontinued mycophenolate in lieu of probable pneumonia/infection.  9.  Hypokalemia/hypomagnesemia/hypophosphatemia -Repleted. -Potassium at 3.7, magnesium at 2.3.  Phosphorus at 2.3. -Replete phosphorus. -Repeat labs in the AM.   DVT prophylaxis: Lovenox Code Status: Full Family Communication: Updated patient and husband at bedside.   Disposition: TBD  Status is: Inpatient Remains inpatient appropriate because: Severity of illness   Consultants:  Curb sided oncology.  Procedures:  CT angiogram chest 09/10/2022 CT abdomen and pelvis 09/10/2022 Chest x-ray 09/09/2022   Antimicrobials:  Anti-infectives (From admission, onward)    Start     Dose/Rate Route Frequency Ordered Stop   09/11/22 1000  ceFEPIme (MAXIPIME) 2 g in sodium chloride 0.9 % 100 mL IVPB        2 g 200 mL/hr over 30 Minutes Intravenous Every 12 hours 09/11/22 0906     09/10/22 0800  cefTRIAXone (ROCEPHIN) 2 g in sodium chloride 0.9 % 100 mL IVPB  Status:  Discontinued        2 g 200 mL/hr over 30 Minutes Intravenous Every 24 hours 09/10/22 0639 09/11/22 0835   09/10/22 0800  azithromycin (ZITHROMAX) 500 mg in sodium chloride 0.9 % 250 mL IVPB        500 mg 250 mL/hr  over 60 Minutes Intravenous Every 24 hours 09/10/22 0639 09/15/22 0759         Subjective: Patient laying in bed.  States improvement with emesis  however still with some nausea.  Tolerating current diet with no emesis after oral intake.  Denies any significant chest pain.  Per RN patient had some complaints of intermittent shortness of breath this morning although was satting at 100% on room air per RN.  Patient asking when paracentesis will be done.    Objective: Vitals:   09/11/22 1316 09/11/22 1925 09/12/22 0414 09/12/22 0916  BP: (!) 156/139 (!) 157/86 (!) 138/91 124/79  Pulse: (!) 109  (!) 104 (!) 124  Resp: 16 15 15    Temp: 98.5 F (36.9 C) 97.7 F (36.5 C) 98.1 F (36.7 C)   TempSrc: Oral Oral    SpO2: 96% 97% 99% 100%  Weight:      Height:        Intake/Output Summary (Last 24 hours) at 09/12/2022 1309 Last data filed at 09/11/2022 2200 Gross per 24 hour  Intake 1383.57 ml  Output --  Net 1383.57 ml   Filed Weights   09/09/22 2104  Weight: 54.4 kg    Examination:  General exam: NAD Respiratory system: CTAB.  No wheezes, no crackles, no rhonchi.  Fair air movement.  Speaking in full sentences.  Cardiovascular system: Regular rate rhythm no murmurs rubs or gallops.  No JVD.  No lower extremity edema. Gastrointestinal system: Abdomen is soft, nontender, nondistended, positive bowel sounds.  No rebound.  No guarding.   Central nervous system: Alert and oriented. No focal neurological deficits. Extremities: Symmetric 5 x 5 power. Skin: No rashes, lesions or ulcers Psychiatry: Judgement and insight appear normal. Mood & affect appropriate.     Data Reviewed: I have personally reviewed following labs and imaging studies  CBC: Recent Labs  Lab 09/09/22 2303 09/11/22 0537 09/12/22 0337  WBC 8.4 7.3 9.9  NEUTROABS 6.2 5.0 7.7  HGB 9.9* 9.7* 9.1*  HCT 31.7* 32.7* 29.0*  MCV 87.6 90.8 88.7  PLT 481* 429* 422*    Basic Metabolic Panel: Recent Labs  Lab 09/09/22 2303 09/10/22 0358 09/10/22 0709 09/11/22 0537 09/12/22 0337  NA 136  --  137 135 130*  K 3.2*  --  3.0* 2.8* 3.7  CL 92*  --  92* 95* 94*   CO2 29  --  31 25 21*  GLUCOSE 173*  --  177* 139* 151*  BUN 15  --  9 7* 11  CREATININE 0.80 0.64 0.52 0.60 0.75  CALCIUM 9.3  --  9.0 8.0* 7.8*  MG 1.9  --   --  1.6* 2.3  PHOS  --   --  2.8  --  2.3*    GFR: Estimated Creatinine Clearance: 44.3 mL/min (by C-G formula based on SCr of 0.75 mg/dL).  Liver Function Tests: Recent Labs  Lab 09/09/22 2303 09/10/22 0709 09/12/22 0337  AST 18  --   --   ALT 10  --   --   ALKPHOS 47  --   --   BILITOT 0.8  --   --   PROT 6.5  --   --   ALBUMIN 2.7* 2.8* 2.1*    CBG: Recent Labs  Lab 09/11/22 2221 09/12/22 0716 09/12/22 1207  GLUCAP 194* 155* 188*     Recent Results (from the past 240 hour(s))  Culture, blood (Routine X 2) w Reflex to ID Panel  Status: None (Preliminary result)   Collection Time: 09/10/22  7:02 AM   Specimen: BLOOD  Result Value Ref Range Status   Specimen Description   Final    BLOOD RIGHT ANTECUBITAL Performed at [facility], [address] [place], [place] [address]    Special Requests   Final    BOTTLES DRAWN AEROBIC AND ANAEROBIC Blood Culture adequate volume Performed at [facility], [address] [place], [place] [address]    Culture   Final    NO GROWTH 2 DAYS Performed at [facility], [address] [place], [place] [address]    Report Status PENDING  Incomplete  Culture, blood (Routine X 2) w Reflex to ID Panel     Status: None (Preliminary result)   Collection Time: 09/10/22  7:09 AM   Specimen: BLOOD RIGHT ARM  Result Value Ref Range Status   Specimen Description   Final    BLOOD RIGHT ARM Performed at [facility], [address] [place], [place] [address]    Special Requests   Final    BOTTLES DRAWN AEROBIC AND ANAEROBIC Blood Culture results may not be optimal due to an inadequate volume of blood received in culture bottles Performed at [facility], [address], [place], [place]  [address]    Culture   Final    NO GROWTH 2 DAYS Performed at [facility], [address], [place], [place] [address]    Report Status PENDING  Incomplete         Radiology Studies: No results found.      Scheduled Meds:  amLODipine  5 mg Oral Daily   [START ON 09/13/2022] atorvastatin  10 mg Oral Q M,W,F-1800   Chlorhexidine Gluconate Cloth  6 each Topical Daily   cholecalciferol  2,000 Units Oral Q breakfast   cloNIDine  0.1 mg Oral BID   enoxaparin (LOVENOX) injection  30 mg Subcutaneous Q24H   furosemide  20 mg Intravenous Once   insulin aspart  0-9 Units Subcutaneous TID WC   predniSONE  5 mg Oral Q breakfast   sodium chloride flush  10-40 mL Intracatheter Q12H   tacrolimus  2 mg Oral QHS   tacrolimus  3 mg Oral Daily   Continuous Infusions:  albumin human     azithromycin 500 mg (09/12/22 0844)   ceFEPime (MAXIPIME) IV 2 g (09/12/22 1010)   potassium PHOSPHATE IVPB (in mmol) 30 mmol (09/12/22 1132)     LOS: 2 days    Time spent: 40 minutes    Ramiro Harvest, MD [facility]   To contact the attending provider between 7A-7P or the covering provider during after hours 7P-7A, please log into the web site [email or web address] and access using universal [place] password for that web site. If you do not have the password, please call the hospital operator.  09/12/2022, 1:09 PM

## 2022-09-12 NOTE — Progress Notes (Signed)
PT Cancellation Note  Patient Details Name: [name] MRN: [id number] DOB: 1945/12/24   Cancelled Treatment:    Reason Eval/Treat Not Completed: Patient at procedure or test/unavailable, recent paracentesis. Will check back tomorrow. [name] Kelch PT Acute Rehabilitation Services Office [phone] Weekend pager-[phone]    Rada Hay 09/12/2022, 3:31 PM

## 2022-09-12 NOTE — Progress Notes (Signed)
   09/12/22 0916  Assess: MEWS Score  BP 124/79  MAP (mmHg) 93  Pulse Rate (!) 124  SpO2 100 %  O2 Device Room Air  Assess: MEWS Score  MEWS Temp 0  MEWS Systolic 0  MEWS Pulse 2  MEWS RR 0  MEWS LOC 0  MEWS Score 2  MEWS Score Color Yellow  Assess: if the MEWS score is Yellow or Red  Were vital signs accurate and taken at a resting state? Yes  Does the patient meet 2 or more of the SIRS criteria? No  MEWS guidelines implemented  Yes, yellow  Treat  MEWS Interventions Considered administering scheduled or prn medications/treatments as ordered  Take Vital Signs  Increase Vital Sign Frequency  Yellow: Q2hr x1, continue Q4hrs until patient remains green for 12hrs  Escalate  MEWS: Escalate Yellow: Discuss with charge nurse and consider notifying provider and/or RRT  Notify: Charge Nurse/RN  Name of Charge Nurse/RN Notified Actuary  Provider Notification  Provider Name/Title Janee Morn MD  Date Provider Notified 09/12/22  Time Provider Notified [phone]  Method of Notification Page  Notification Reason Other (Comment) (yellow MEWS)  Provider response Other (Comment)  Date of Provider Response 09/12/22  Time of Provider Response [phone]  Assess: SIRS CRITERIA  SIRS Temperature  0  SIRS Pulse 1  SIRS Respirations  0  SIRS WBC 0  SIRS Score Sum  1   Pt. Complained of SOB, heartburn and nausea. Vital sign taken and recorded, PRN nausea meds given. Pt. Is alert and oriented, ill appearing but not in distress. MD notified, will continue to monitor.

## 2022-09-12 NOTE — Plan of Care (Signed)

## 2022-09-13 ENCOUNTER — Other Ambulatory Visit (HOSPITAL_COMMUNITY): Payer: Self-pay

## 2022-09-13 DIAGNOSIS — R112 Nausea with vomiting, unspecified: Secondary | ICD-10-CM | POA: Diagnosis not present

## 2022-09-13 DIAGNOSIS — R180 Malignant ascites: Secondary | ICD-10-CM | POA: Diagnosis not present

## 2022-09-13 DIAGNOSIS — E118 Type 2 diabetes mellitus with unspecified complications: Secondary | ICD-10-CM | POA: Diagnosis not present

## 2022-09-13 DIAGNOSIS — J90 Pleural effusion, not elsewhere classified: Secondary | ICD-10-CM | POA: Diagnosis not present

## 2022-09-13 LAB — CBC
HCT: 29 % — ABNORMAL LOW (ref 36.0–46.0)
Hemoglobin: 9.1 g/dL — ABNORMAL LOW (ref 12.0–15.0)
MCH: 27.4 pg (ref 26.0–34.0)
MCHC: 31.4 g/dL (ref 30.0–36.0)
MCV: 87.3 fL (ref 80.0–100.0)
Platelets: 410 10*3/uL — ABNORMAL HIGH (ref 150–400)
RBC: 3.32 MIL/uL — ABNORMAL LOW (ref 3.87–5.11)
RDW: 15.9 % — ABNORMAL HIGH (ref 11.5–15.5)
WBC: 9.2 10*3/uL (ref 4.0–10.5)
nRBC: 0 % (ref 0.0–0.2)

## 2022-09-13 LAB — RENAL FUNCTION PANEL
Albumin: 2.6 g/dL — ABNORMAL LOW (ref 3.5–5.0)
Anion gap: 11 (ref 5–15)
BUN: 9 mg/dL (ref 8–23)
CO2: 29 mmol/L (ref 22–32)
Calcium: 8.2 mg/dL — ABNORMAL LOW (ref 8.9–10.3)
Chloride: 97 mmol/L — ABNORMAL LOW (ref 98–111)
Creatinine, Ser: 0.53 mg/dL (ref 0.44–1.00)
GFR, Estimated: >60 mL/min (ref >60)
Glucose, Bld: 181 mg/dL — ABNORMAL HIGH (ref 70–99)
Phosphorus: 2.3 mg/dL — ABNORMAL LOW (ref 2.5–4.6)
Potassium: 3 mmol/L — ABNORMAL LOW (ref 3.5–5.1)
Sodium: 137 mmol/L (ref 135–145)

## 2022-09-13 LAB — GLUCOSE, CAPILLARY
Glucose-Capillary: 170 mg/dL — ABNORMAL HIGH (ref 70–99)
Glucose-Capillary: 189 mg/dL — ABNORMAL HIGH (ref 70–99)

## 2022-09-13 MED ORDER — PROCHLORPERAZINE MALEATE 10 MG PO TABS
10.0000 mg | ORAL_TABLET | Freq: Four times a day (QID) | ORAL | 1 refills | Status: AC | PRN
  Filled 2022-09-13: qty 20, 5d supply, fill #0

## 2022-09-13 MED ORDER — K PHOS MONO-SOD PHOS DI & MONO 155-852-130 MG PO TABS: 250.0000 mg | ORAL_TABLET | Freq: Two times a day (BID) | ORAL | 0 refills | Status: AC

## 2022-09-13 MED ORDER — ENOXAPARIN SODIUM 40 MG/0.4ML IJ SOSY: 40.0000 mg | PREFILLED_SYRINGE | INTRAMUSCULAR | Status: DC

## 2022-09-13 MED ORDER — K PHOS MONO-SOD PHOS DI & MONO 155-852-130 MG PO TABS
250.0000 mg | ORAL_TABLET | Freq: Two times a day (BID) | ORAL | Status: DC
  Administered 2022-09-13: 250 mg via ORAL
  Filled 2022-09-13 (×2): qty 1

## 2022-09-13 MED ORDER — MYCOPHENOLATE SODIUM 180 MG PO TBEC: 180.0000 mg | DELAYED_RELEASE_TABLET | Freq: Two times a day (BID) | ORAL | Status: AC

## 2022-09-13 MED ORDER — POTASSIUM CHLORIDE CRYS ER 10 MEQ PO TBCR
40.0000 meq | EXTENDED_RELEASE_TABLET | ORAL | Status: AC
  Administered 2022-09-13 (×2): 40 meq via ORAL
  Filled 2022-09-13 (×2): qty 4

## 2022-09-13 MED ORDER — POTASSIUM CHLORIDE 10 MEQ/100ML IV SOLN: 10.0000 meq | INTRAVENOUS | Status: DC

## 2022-09-13 MED ORDER — K PHOS MONO-SOD PHOS DI & MONO 155-852-130 MG PO TABS
250.0000 mg | ORAL_TABLET | Freq: Two times a day (BID) | ORAL | 0 refills | Status: DC
  Filled 2022-09-13: qty 6, 3d supply, fill #0

## 2022-09-13 MED ORDER — HEPARIN SOD (PORK) LOCK FLUSH 100 UNIT/ML IV SOLN
500.0000 [IU] | INTRAVENOUS | Status: AC | PRN
  Administered 2022-09-13: 500 [IU]
  Filled 2022-09-13: qty 5

## 2022-09-13 MED ORDER — CEFUROXIME AXETIL 500 MG PO TABS
500.0000 mg | ORAL_TABLET | Freq: Two times a day (BID) | ORAL | 0 refills | Status: AC
  Filled 2022-09-13: qty 8, 4d supply, fill #0

## 2022-09-13 MED ORDER — FUROSEMIDE 20 MG PO TABS
20.0000 mg | ORAL_TABLET | Freq: Every day | ORAL | 1 refills | Status: AC | PRN
  Filled 2022-09-13: qty 30, 30d supply, fill #0

## 2022-09-13 NOTE — Discharge Summary (Signed)
Physician Discharge Summary  [name] ZOX:[id number] DOB: [birth year] DOA: 09/09/2022  PCP: Mila Palmer, MD  Admit date: 09/09/2022 Discharge date: 09/13/2022  Time spent: 60 minutes  Recommendations for Outpatient Follow-up:  Follow-up with Mila Palmer, MD in 1 to 2 weeks.  On follow-up patient will need a basic metabolic profile, magnesium level, phosphorus level checked to follow-up on electrolytes and renal function.  Patient's pneumonia will need to be followed up upon.  Blood cultures obtained during the hospitalization as well as labs from paracentesis will need to be followed up upon. Follow-up with Dr. Angelina Sheriff, hematology/oncology as scheduled on September 23, 2022.  On follow-up lab work sent on paracentesis including cytology will need to be followed up upon.   Discharge Diagnoses:  Principal Problem:   Nausea and vomiting Active Problems:   Type 2 diabetes mellitus with complication (HCC)   Essential hypertension   Immunosuppression due to drug therapy for kidney transplant   Nausea & vomiting   Colon cancer (HCC)   Pneumonia   Malignant ascites   Pleural effusion   Discharge Condition: Stable and improved.  Diet recommendation: Regular  Filed Weights   09/09/22 2104  Weight: 54.4 kg    History of present illness:  HPI per Dr. Joneen Roach This is a 77 yo female with PMH renal transplant (2013), HLD, DM II, HTN.  Recent diagnosis of metastatic colon cancer, sp hemicolectomy 09/03/2022.  She is scheduled to start chemotherapy.  Per patient yesterday morning she woke up patient just did not feel right.  Her heart was racing, she had lots of acid in her body.  She went and developed nausea and vomiting which persisted most of the day.  Her abdomen was sore but no pain.  She denies fevers but endorses chills.  She also had a port placed.   In the ER patient vitals stable, afebrile.  CTA chest shows large bilateral pleural effusions right slightly greater than  left. Associated lower lobe consolidation is noted CT of the abdomen and pelvis showed: Changes consistent with the known right hemicolectomy. No obstructive changes at the anastomosis are seen. Significant increase in ascites and enhancing peritoneal implants consistent with progressive metastatic disease. Mesenteric involvement is also noted near the anastomotic site.   Hospital Course:  #1 left lower lobe consolidation/immunocompromised patient -Patient presented with nausea vomiting and not feeling well with complaints of reflux. -CT angiogram chest done negative for PE however showed bilateral large pleural effusions right slightly greater than left, associated lower lobe consolidation also noted. -Blood cultures ordered and pending with no growth to date x 3 days. -Patient maintained on azithromycin and cefepime throughout the hospitalization and will be discharged on Ceftin twice daily x 4 more days to complete a 7-day course of antibiotic treatment. -Outpatient follow-up with PCP.   2.  Nausea and vomiting -Likely secondary to metastatic colon cancer. -Patient initially was placed on bowel rest, IV fluids, IV antiemetics, supportive care. -Patient improved clinically, underwent paracentesis with 2.5 L of clear yellow fluid removed. -Diet was advanced to a regular diet which patient tolerated. -Patient had no further emesis during the hospitalization and eager to be discharged. -Patient be discharged in stable and improved condition. -Outpatient follow-up with primary hematologist/oncologist as scheduled.   3.  Abdominal ascites/bilateral large pleural effusions -Concern likely malignant in nature. -Patient with sats of 99-100% on room air. -Diagnostic and therapeutic ultrasound-guided paracentesis was done with 2.5 L of clear yellow fluid removed during procedure and specimen sent for labs  including cytology.   -Patient also received Lasix 20 mg IV x 1 with good urine output and  clinical improvement.   -Patient be discharged on Lasix 20 mg daily as needed.   -Outpatient follow-up with primary hematologist/oncologist.    4.  Metastatic colon cancer -Status post hemicolectomy. -Outpatient follow-up with oncology as scheduled in September ninth 2024, to discuss chemotherapy treatment options.   5.  Hypertension -Patient maintained on home regimen clonidine, Norvasc.    6.  Type 2 diabetes mellitus -Hemoglobin A1c 8.1 (08/02/2022) -Patient maintained on SSI during the hospitalization.     7.  Hyperlipidemia -Patient maintained on home regimen statin.  Statin.    8.  History of renal transplant -Patient maintained on home regimen Prograf and home regimen prednisone.. -Mycophenolate was discontinued during the hospitalization in lieu of concern for pneumonia and will be resumed after completion of antibiotic therapy on discharge.    9.  Hypokalemia/hypomagnesemia/hypophosphatemia -Repleted during the hospitalization. -Outpatient follow-up with PCP.    Procedures: CT angiogram chest 09/10/2022 CT abdomen and pelvis 09/10/2022 Chest x-ray 09/09/2022 Ultrasound-guided paracentesis right abdomen with 2.5 L of clear yellow fluid removed: Per IR: Alwyn Ren, NP 09/12/2022  Consultations: Curb sided hematology/oncology: Dr. Leonides Schanz  Discharge Exam: Vitals:   09/13/22 0455 09/13/22 1322  BP: 134/82 (!) 140/95  Pulse: (!) 104 99  Resp: 16   Temp: 98.2 F (36.8 C) (!) 97.4 F (36.3 C)  SpO2: 99% 99%    General: NAD Cardiovascular: RRR no murmurs rubs or gallops.  No JVD.  No lower extremity edema. Respiratory: Clear to auscultation bilaterally.  No wheezes, no crackles, no rhonchi.  Fair air movement.  Speaking in full sentences.  Discharge Instructions   Discharge Instructions     Diet general   Complete by: As directed    Increase activity slowly   Complete by: As directed       Allergies as of 09/13/2022       Reactions   Amoxicillin-pot  Clavulanate Nausea And Vomiting   Aspirin Nausea And Vomiting, Other (See Comments)   Dizziness, GI Intolerance, Ringing in ears   Ciprofloxacin Other (See Comments)   Dizziness   Fexofenadine Other (See Comments)   Dizziness   Ibuprofen Nausea And Vomiting, Other (See Comments)   Ringing in ears also   Nitrofurantoin Nausea Only, Other (See Comments)   GI Intolerance   Nitrofurantoin Macrocrystal Nausea Only   Simvastatin Other (See Comments)   Muscle aches        Medication List     STOP taking these medications    metoprolol succinate 50 MG 24 hr tablet Commonly known as: TOPROL-XL       TAKE these medications    amLODipine 5 MG tablet Commonly known as: NORVASC Take 5 mg by mouth daily.   atorvastatin 10 MG tablet Commonly known as: LIPITOR Take 10 mg by mouth See admin instructions. Take 10 mg by mouth two to three times a week   BIOTIN PO Take 1 tablet by mouth daily with breakfast.   cefUROXime 500 MG tablet Commonly known as: CEFTIN Take 1 tablet (500 mg total) by mouth 2 (two) times daily with a meal for 4 days.   cloNIDine 0.1 MG tablet Commonly known as: CATAPRES Take 0.1 mg by mouth 2 (two) times daily.   furosemide 20 MG tablet Commonly known as: Lasix Take 1 tablet (20 mg total) by mouth daily as needed for fluid or edema (If greater than 3 pound weight  gain in a day or greater than 5 pound weight gain in the week).   HYDROcodone-acetaminophen 5-325 MG tablet Commonly known as: NORCO/VICODIN Take 1 tablet by mouth every 8 (eight) hours as needed for severe pain or moderate pain.   magnesium oxide 400 MG tablet Commonly known as: MAG-OX Take 400 mg by mouth daily.   metFORMIN 500 MG 24 hr tablet Commonly known as: GLUCOPHAGE-XR Take 500 mg by mouth See admin instructions. Pt states she takes it every other day   mycophenolate 180 MG EC tablet Commonly known as: MYFORTIC Take 1 tablet (180 mg total) by mouth 2 (two) times daily. Start  taking on: September 18, 2022 What changed: These instructions start on September 18, 2022. If you are unsure what to do until then, ask your doctor or other care provider.   phosphorus 155-852-130 MG tablet Commonly known as: K PHOS NEUTRAL Take 1 tablet (250 mg total) by mouth 2 (two) times daily for 3 days.   potassium chloride SA 20 MEQ tablet Commonly known as: KLOR-CON M Take 20 mEq by mouth daily.   predniSONE 5 MG tablet Commonly known as: DELTASONE Take 5 mg by mouth daily with breakfast.   prochlorperazine 10 MG tablet Commonly known as: COMPAZINE Take 1 tablet (10 mg total) by mouth every 6 (six) hours as needed for nausea or vomiting.   sulfamethoxazole-trimethoprim 400-80 MG tablet Commonly known as: BACTRIM Take 1 tablet by mouth every Monday, Wednesday, and Friday.   tacrolimus 1 MG capsule Commonly known as: PROGRAF Take 2-3 mg by mouth 2 (two) times daily. Take 3mg  daily in the morning and 2mg  daily at night.   TYLENOL 500 MG tablet Generic drug: acetaminophen Take 500-1,000 mg by mouth every 6 (six) hours as needed (for headaches or pain).   valACYclovir 1000 MG tablet Commonly known as: VALTREX Take 500 mg by mouth every 12 (twelve) hours. For 10 days   Vitamin D3 50 MCG (2000 UT) Tabs Take 2,000 Units by mouth daily with breakfast.       Allergies  Allergen Reactions   Amoxicillin-Pot Clavulanate Nausea And Vomiting   Aspirin Nausea And Vomiting and Other (See Comments)    Dizziness, GI Intolerance, Ringing in ears   Ciprofloxacin Other (See Comments)    Dizziness   Fexofenadine Other (See Comments)    Dizziness   Ibuprofen Nausea And Vomiting and Other (See Comments)    Ringing in ears also   Nitrofurantoin Nausea Only and Other (See Comments)    GI Intolerance   Nitrofurantoin Macrocrystal Nausea Only   Simvastatin Other (See Comments)    Muscle aches    Follow-up Information     Mila Palmer, MD. Schedule an appointment as soon as  possible for a visit in 1 week(s).   Specialty: Family Medicine Why: Follow-up in 1 to 2 weeks Contact information: [address] [place] [address] [phone]         Angelina Sheriff, Michail Jewels, MD Follow up on 09/23/2022.   Specialty: Hematology and Oncology Why: Follow-up as scheduled on 09/23/2022 Contact information: [address] [place] [address] [phone]                  The results of significant diagnostics from this hospitalization (including imaging, microbiology, ancillary and laboratory) are listed below for reference.    Significant Diagnostic Studies: US Paracentesis  Result Date: 09/12/2022 INDICATION: Patient with a history of metastatic colon cancer presents today with  ascites. Interventional radiology asked to perform a diagnostic and therapeutic paracentesis. EXAM: ULTRASOUND GUIDED PARACENTESIS MEDICATIONS: 1% lidocaine 15 mL COMPLICATIONS: None immediate. PROCEDURE: Informed written consent was obtained from the patient after a discussion of the risks, benefits and alternatives to treatment. A timeout was performed prior to the initiation of the procedure. Initial ultrasound scanning demonstrates a large amount of ascites within the right lower abdominal quadrant. The right lower abdomen was prepped and draped in the usual sterile fashion. 1% lidocaine was used for local anesthesia. Following this, a 19 gauge, 7-cm, Yueh catheter was introduced. An ultrasound image was saved for documentation purposes. The paracentesis was performed. The catheter was removed and a dressing was applied. The patient tolerated the procedure well without immediate post procedural complication. FINDINGS: A total of approximately 2.5 L of clear yellow fluid was removed. Samples were sent to the laboratory as requested by the clinical team. IMPRESSION: Successful ultrasound-guided paracentesis yielding 2.5 liters of peritoneal fluid. Procedure performed  by Alwyn Ren NP Electronically Signed   By: Richarda Overlie M.D.   On: 09/12/2022 16:21   DG CHEST PORT 1 VIEW  Result Date: 09/12/2022 CLINICAL DATA:  Shortness of breath EXAM: PORTABLE CHEST 1 VIEW COMPARISON:  CXR 09/09/22 FINDINGS: Right-sided needle access chest port with unchanged positioning of the tip. Unchanged cardiac and mediastinal contours. New small bilateral pleural effusions, right-greater-than-left. Superimposed airspace opacities likely represent atelectasis. No radiographically apparent displaced rib fractures. Visualized upper abdomen is notable for gastric gaseous distention. IMPRESSION: Small bilateral pleural effusions, right-greater-than-left. Superimposed airspace opacities likely represent atelectasis. Electronically Signed   By: Lorenza Cambridge M.D.   On: 09/12/2022 13:38   CT Angio Chest PE W/Cm &/Or Wo Cm  Result Date: 09/10/2022 CLINICAL DATA:  History of colon carcinoma with recent port placement 11 days ago and persistent nausea and chest and abdominal pain, initial encounter EXAM: CT ANGIOGRAPHY CHEST CT ABDOMEN AND PELVIS WITH CONTRAST TECHNIQUE: Multidetector CT imaging of the chest was performed using the standard protocol during bolus administration of intravenous contrast. Multiplanar CT image reconstructions and MIPs were obtained to evaluate the vascular anatomy. Multidetector CT imaging of the abdomen and pelvis was performed using the standard protocol during bolus administration of intravenous contrast. RADIATION DOSE REDUCTION: This exam was performed according to the departmental dose-optimization program which includes automated exposure control, adjustment of the mA and/or kV according to patient size and/or use of iterative reconstruction technique. CONTRAST:  75mL OMNIPAQUE IOHEXOL 350 MG/ML SOLN COMPARISON:  Chest x-ray from the previous day.  CT from 08/04/2018 FINDINGS: CTA CHEST FINDINGS Cardiovascular: Thoracic aorta demonstrates atherosclerotic  calcifications without aneurysmal dilatation or dissection. No cardiac enlargement is seen. Coronary calcifications are noted. The pulmonary artery shows a normal branching pattern bilaterally. No filling defect to suggest pulmonary embolism is noted. Mediastinum/Nodes: Thoracic inlet is within normal limits. Right-sided chest port is noted. No hilar or mediastinal adenopathy is noted. The esophagus as visualized is within normal limits. Lungs/Pleura: Large bilateral pleural effusions are noted right slightly greater than left. Associated lower lobe consolidation is seen. No parenchymal nodules are seen. No pneumothorax is noted. Musculoskeletal: No acute bony abnormality is noted. Review of the MIP images confirms the above findings. CT ABDOMEN and PELVIS FINDINGS Hepatobiliary: Liver is well visualized. Gallbladder is partially decompressed. Hypodensity seen in the posterior right lobe of the liver on the prior exam is not well appreciated on this exam. Pancreas: Unremarkable. No pancreatic ductal dilatation or surrounding inflammatory changes. Spleen: Normal in size without  focal abnormality. Adrenals/Urinary Tract: Adrenal glands are within normal limits. Native kidneys are shrunken. Transplant kidney is noted in the right iliac fossa. No obstructive changes are seen. The bladder is well distended. Stomach/Bowel: Scattered diverticular change of the colon is noted. Changes are noted consistent with right hemicolectomy. Small bowel to colonic anastomosis is noted in the midline. No obstructive changes are seen. Small bowel and stomach show no obstructive change. Vascular/Lymphatic: Aortic atherosclerosis. No enlarged abdominal or pelvic lymph nodes. Reproductive: Uterus and bilateral adnexa are unremarkable. Other: Significant ascites is noted throughout the abdomen increased when compared with the prior exam. Multiple enhancing peritoneal implants are noted also increased when compared with the prior study  consistent with progressive metastatic disease. Musculoskeletal: No acute or significant osseous findings. Review of the MIP images confirms the above findings. IMPRESSION: CTA of the chest: No evidence of pulmonary emboli. Large bilateral pleural effusions right slightly greater than left. Associated lower lobe consolidation is noted. CT of the abdomen and pelvis: Changes consistent with the known right hemicolectomy. No obstructive changes at the anastomosis are seen. Significant increase in ascites and enhancing peritoneal implants consistent with progressive metastatic disease. Mesenteric involvement is also noted near the anastomotic site. Diverticulosis without diverticulitis. Right iliac fossa renal transplant. Electronically Signed   By: Alcide Clever M.D.   On: 09/10/2022 03:04   CT ABDOMEN PELVIS W CONTRAST  Result Date: 09/10/2022 CLINICAL DATA:  History of colon carcinoma with recent port placement 11 days ago and persistent nausea and chest and abdominal pain, initial encounter EXAM: CT ANGIOGRAPHY CHEST CT ABDOMEN AND PELVIS WITH CONTRAST TECHNIQUE: Multidetector CT imaging of the chest was performed using the standard protocol during bolus administration of intravenous contrast. Multiplanar CT image reconstructions and MIPs were obtained to evaluate the vascular anatomy. Multidetector CT imaging of the abdomen and pelvis was performed using the standard protocol during bolus administration of intravenous contrast. RADIATION DOSE REDUCTION: This exam was performed according to the departmental dose-optimization program which includes automated exposure control, adjustment of the mA and/or kV according to patient size and/or use of iterative reconstruction technique. CONTRAST:  75mL OMNIPAQUE IOHEXOL 350 MG/ML SOLN COMPARISON:  Chest x-ray from the previous day.  CT from 08/04/2018 FINDINGS: CTA CHEST FINDINGS Cardiovascular: Thoracic aorta demonstrates atherosclerotic calcifications without  aneurysmal dilatation or dissection. No cardiac enlargement is seen. Coronary calcifications are noted. The pulmonary artery shows a normal branching pattern bilaterally. No filling defect to suggest pulmonary embolism is noted. Mediastinum/Nodes: Thoracic inlet is within normal limits. Right-sided chest port is noted. No hilar or mediastinal adenopathy is noted. The esophagus as visualized is within normal limits. Lungs/Pleura: Large bilateral pleural effusions are noted right slightly greater than left. Associated lower lobe consolidation is seen. No parenchymal nodules are seen. No pneumothorax is noted. Musculoskeletal: No acute bony abnormality is noted. Review of the MIP images confirms the above findings. CT ABDOMEN and PELVIS FINDINGS Hepatobiliary: Liver is well visualized. Gallbladder is partially decompressed. Hypodensity seen in the posterior right lobe of the liver on the prior exam is not well appreciated on this exam. Pancreas: Unremarkable. No pancreatic ductal dilatation or surrounding inflammatory changes. Spleen: Normal in size without focal abnormality. Adrenals/Urinary Tract: Adrenal glands are within normal limits. Native kidneys are shrunken. Transplant kidney is noted in the right iliac fossa. No obstructive changes are seen. The bladder is well distended. Stomach/Bowel: Scattered diverticular change of the colon is noted. Changes are noted consistent with right hemicolectomy. Small bowel to colonic anastomosis is noted  in the midline. No obstructive changes are seen. Small bowel and stomach show no obstructive change. Vascular/Lymphatic: Aortic atherosclerosis. No enlarged abdominal or pelvic lymph nodes. Reproductive: Uterus and bilateral adnexa are unremarkable. Other: Significant ascites is noted throughout the abdomen increased when compared with the prior exam. Multiple enhancing peritoneal implants are noted also increased when compared with the prior study consistent with progressive  metastatic disease. Musculoskeletal: No acute or significant osseous findings. Review of the MIP images confirms the above findings. IMPRESSION: CTA of the chest: No evidence of pulmonary emboli. Large bilateral pleural effusions right slightly greater than left. Associated lower lobe consolidation is noted. CT of the abdomen and pelvis: Changes consistent with the known right hemicolectomy. No obstructive changes at the anastomosis are seen. Significant increase in ascites and enhancing peritoneal implants consistent with progressive metastatic disease. Mesenteric involvement is also noted near the anastomotic site. Diverticulosis without diverticulitis. Right iliac fossa renal transplant. Electronically Signed   By: Alcide Clever M.D.   On: 09/10/2022 03:04   DG Chest Port 1 View  Result Date: 09/09/2022 CLINICAL DATA:  History of colon cancer presenting with mid chest pain, indigestion and weakness. EXAM: PORTABLE CHEST 1 VIEW COMPARISON:  August 07, 2010 FINDINGS: The right-sided venous Port-A-Cath is seen with its distal tip overlying the distal aspect of the superior vena cava. The heart size and mediastinal contours are within normal limits. There is marked severity calcification of the aortic arch. Mild, layering bilateral pleural effusions are seen. Mild atelectasis is noted within the left lung base. No pneumothorax is identified. The visualized skeletal structures are unremarkable. IMPRESSION: Mild, layering bilateral pleural effusions with mild left basilar atelectasis. Electronically Signed   By: Aram Candela M.D.   On: 09/09/2022 22:56    Microbiology: Recent Results (from the past 240 hour(s))  Culture, blood (Routine X 2) w Reflex to ID Panel     Status: None (Preliminary result)   Collection Time: 09/10/22  7:02 AM   Specimen: BLOOD  Result Value Ref Range Status   Specimen Description   Final    BLOOD RIGHT ANTECUBITAL Performed at [facility], [address], [place], [place] [address]    Special Requests   Final    BOTTLES DRAWN AEROBIC AND ANAEROBIC Blood Culture adequate volume Performed at [facility], [address], [place], [place] [address]    Culture   Final    NO GROWTH 3 DAYS Performed at [facility], [address] [place], [place] [address]    Report Status PENDING  Incomplete  Culture, blood (Routine X 2) w Reflex to ID Panel     Status: None (Preliminary result)   Collection Time: 09/10/22  7:09 AM   Specimen: BLOOD RIGHT ARM  Result Value Ref Range Status   Specimen Description   Final    BLOOD RIGHT ARM Performed at [facility], [address], [place], [place] [address]    Special Requests   Final    BOTTLES DRAWN AEROBIC AND ANAEROBIC Blood Culture results may not be optimal due to an inadequate volume of blood received in culture bottles Performed at [place] [facility], [address], [place], [place] [address]    Culture   Final    NO GROWTH 3 DAYS Performed at [facility], [address] [place], [place] [address]    Report Status PENDING  Incomplete  Body fluid culture w Gram Stain  Status: None (Preliminary result)   Collection Time: 09/12/22  2:38 PM   Specimen: PATH Cytology Peritoneal fluid  Result Value Ref Range Status   Specimen Description   Final    PERITONEAL Performed at [facility], [address] [place], [place] [address]    Special Requests   Final    NONE Performed at [facility], [address], [place], [place] [address]    Gram Stain   Final    RARE WBC PRESENT,BOTH PMN AND MONONUCLEAR NO ORGANISMS SEEN    Culture   Final    NO GROWTH < 24 HOURS Performed at [facility], [address], [place], [place] [address]    Report Status PENDING  Incomplete     Labs: Basic Metabolic Panel: Recent Labs  Lab 09/09/22 2303 09/10/22 0358 09/10/22 0709 09/11/22 0537  09/12/22 0337 09/13/22 0301  NA 136  --  137 135 130* 137  K 3.2*  --  3.0* 2.8* 3.7 3.0*  CL 92*  --  92* 95* 94* 97*  CO2 29  --  31 25 21* 29  GLUCOSE 173*  --  177* 139* 151* 181*  BUN 15  --  9 7* 11 9  CREATININE 0.80 0.64 0.52 0.60 0.75 0.53  CALCIUM 9.3  --  9.0 8.0* 7.8* 8.2*  MG 1.9  --   --  1.6* 2.3  --   PHOS  --   --  2.8  --  2.3* 2.3*   Liver Function Tests: Recent Labs  Lab 09/09/22 2303 09/10/22 0709 09/12/22 0337 09/13/22 0301  AST 18  --   --   --   ALT 10  --   --   --   ALKPHOS 47  --   --   --   BILITOT 0.8  --   --   --   PROT 6.5  --   --   --   ALBUMIN 2.7* 2.8* 2.1* 2.6*   Recent Labs  Lab 09/09/22 2303  LIPASE 26   No results for input(s): "AMMONIA" in the last 168 hours. CBC: Recent Labs  Lab 09/09/22 2303 09/11/22 0537 09/12/22 0337 09/13/22 0301  WBC 8.4 7.3 9.9 9.2  NEUTROABS 6.2 5.0 7.7  --   HGB 9.9* 9.7* 9.1* 9.1*  HCT 31.7* 32.7* 29.0* 29.0*  MCV 87.6 90.8 88.7 87.3  PLT 481* 429* 422* 410*   Cardiac Enzymes: No results for input(s): "CKTOTAL", "CKMB", "CKMBINDEX", "TROPONINI" in the last 168 hours. BNP: BNP (last 3 results) Recent Labs    09/10/22 0354 09/12/22 0336  BNP 176.5* 167.9*    ProBNP (last 3 results) No results for input(s): "PROBNP" in the last 8760 hours.  CBG: Recent Labs  Lab 09/12/22 1207 09/12/22 1724 09/12/22 2034 09/13/22 0716 09/13/22 1205  GLUCAP 188* 226* 148* 170* 189*       Signed:  Ramiro Harvest MD.  [facility] 09/13/2022, 3:30 PM

## 2022-09-13 NOTE — Progress Notes (Signed)
Pt's meds delivered to patient  in room, also given phosphorous  prescription. Husband to call 24 hr pharmacies on [address] in [place] to check availability

## 2022-09-13 NOTE — Care Management Important Message (Signed)
Important Message  Patient Details IM Letter given. Name: [name] MRN: [id number] Date of Birth: 1945/01/29   Medicare Important Message Given:  Yes     Caren Macadam 09/13/2022, 2:53 PM

## 2022-09-13 NOTE — Plan of Care (Signed)

## 2022-09-13 NOTE — Evaluation (Signed)
Occupational Therapy Evaluation Patient Details Name: [name] MRN: [id number] DOB: 1945/08/10 Today's Date: 09/13/2022   History of Present Illness Pt is 77 yo female admitted on 09/09/22 with nausea and vomiting likely secondary to metastatic colon CA.  Pt found to have large bil pleural effusions and increased ascites. She is s/p paracentesis yielding 2.5 L on 09/12/22.  Pt with hx including but not limited to renal transplant 2013, HLD, DM2, HTN, recent diagnosis metastatic colon CA s/p hemicolectomy on 09/03/22 and scheduled to start chemo soon.   Clinical Impression   Pt close to baseline level of function. S for ADL and mobility @ RW level. Husband able to assist at DC. No DME needs. OT signing off.        If plan is discharge home, recommend the following: A little help with bathing/dressing/bathroom;Assistance with cooking/housework;Assist for transportation    Functional Status Assessment  Patient has not had a recent decline in their functional status  Equipment Recommendations  None recommended by OT    Recommendations for Other Services       Precautions / Restrictions Precautions Precautions: Fall Restrictions Weight Bearing Restrictions: No      Mobility Bed Mobility Overal bed mobility: Modified Independent                  Transfers Overall transfer level: Needs assistance Equipment used: Rolling walker (2 wheels), None Transfers: Sit to/from Stand Sit to Stand: Supervision                  Balance Overall balance assessment: Needs assistance Sitting-balance support: No upper extremity supported Sitting balance-Leahy Scale: Good     Standing balance support: No upper extremity supported Standing balance-Leahy Scale: Fair Standing balance comment: Ambulated without AD but did have 1 LOB; Steady with RW                           ADL either performed or assessed with clinical judgement   ADL Overall ADL's : Needs  assistance/impaired;At baseline                                       General ADL Comments: husband provides assist as needed     Vision         Perception         Praxis         Pertinent Vitals/Pain Pain Assessment Pain Assessment: No/denies pain     Extremity/Trunk Assessment Upper Extremity Assessment Upper Extremity Assessment: Generalized weakness   Lower Extremity Assessment Lower Extremity Assessment: Defer to PT evaluation   Cervical / Trunk Assessment Cervical / Trunk Assessment: Normal   Communication     Cognition Arousal: Alert Behavior During Therapy: WFL for tasks assessed/performed Overall Cognitive Status: Within Functional Limits for tasks assessed                                       General Comments       Exercises     Shoulder Instructions      Home Living Family/patient expects to be discharged to:: Private residence Living Arrangements: Spouse/significant other Available Help at Discharge: Family;Available 24 hours/day (spouse or son home most of the time) Type of Home: House Home Access: Stairs to enter Entrance  Stairs-Number of Steps: 1   Home Layout: One level     Bathroom Shower/Tub: Producer, television/film/video: Handicapped height Bathroom Accessibility: Yes How Accessible: Accessible via walker Home Equipment: Pharmacist, hospital (2 wheels);Wheelchair - manual          Prior Functioning/Environment Prior Level of Function : Independent/Modified Independent;Driving             Mobility Comments: Could ambulate in community without AD; denies falls ADLs Comments: independent adls and iadls        OT Problem List:        OT Treatment/Interventions:      OT Goals(Current goals can be found in the care plan section) Acute Rehab OT Goals Patient Stated Goal: go home OT Goal Formulation: All assessment and education complete, DC therapy  OT Frequency:       Co-evaluation              AM-PAC OT "6 Clicks" Daily Activity     Outcome Measure Help from another person eating meals?: None Help from another person taking care of personal grooming?: None Help from another person toileting, which includes using toliet, bedpan, or urinal?: A Little Help from another person bathing (including washing, rinsing, drying)?: A Little Help from another person to put on and taking off regular upper body clothing?: None Help from another person to put on and taking off regular lower body clothing?: A Little 6 Click Score: 21   End of Session Equipment Utilized During Treatment: Gait belt;Rolling walker (2 wheels) Nurse Communication: Mobility status  Activity Tolerance: Patient tolerated treatment well Patient left: in chair;with call bell/phone within reach;with family/visitor present  OT Visit Diagnosis: Unsteadiness on feet (R26.81)                Time: 1550-1606 OT Time Calculation (min): 16 min Charges:  OT General Charges $OT Visit: 1 Visit OT Evaluation $OT Eval Low Complexity: 1 Low  Calvert Charland, OT/L   Acute OT Clinical Specialist Acute Rehabilitation Services Pager [phone] Office [phone]   [facility] 09/13/2022, 4:10 PM

## 2022-09-13 NOTE — Evaluation (Signed)
Physical Therapy Evaluation Patient Details Name: [name] MRN: [id number] DOB: 09-Dec-1945 Today's Date: 09/13/2022  History of Present Illness  Pt is 77 yo female admitted on 09/09/22 with nausea and vomiting likely secondary to metastatic colon CA.  Pt found to have large bil pleural effusions and increased ascites. She is s/p paracentesis yielding 2.5 L on 09/12/22.  Pt with hx including but not limited to renal transplant 2013, HLD, DM2, HTN, recent diagnosis metastatic colon CA s/p hemicolectomy on 09/03/22 and scheduled to start chemo soon.  Clinical Impression  Pt admitted with above diagnosis. At baseline, pt is independent without AD.  She does have home support and a RW if needed.  Today, pt ambulating well with RW.  She did have 1 LOB requiring min A when attempting without RW.  Will maintain on caseload to further mobility, safety, and balance.  Do advice RW at this time.  Pt only slightly below baseline, has family support, and expected to progress well - likely no PT needs at d/c.  Pt currently with functional limitations due to the deficits listed below (see PT Problem List). Pt will benefit from acute skilled PT to increase their independence and safety with mobility to allow discharge.           If plan is discharge home, recommend the following: Assistance with cooking/housework;Help with stairs or ramp for entrance   Can travel by private vehicle        Equipment Recommendations None recommended by PT  Recommendations for Other Services       Functional Status Assessment Patient has had a recent decline in their functional status and demonstrates the ability to make significant improvements in function in a reasonable and predictable amount of time.     Precautions / Restrictions Precautions Precautions: Fall Restrictions Weight Bearing Restrictions: No      Mobility  Bed Mobility               General bed mobility comments: inchair at arrival; has  been up in room with spouse assist    Transfers Overall transfer level: Needs assistance Equipment used: Rolling walker (2 wheels), None Transfers: Sit to/from Stand Sit to Stand: Supervision           General transfer comment: Performed from recliner with RW adn toilet without AD; performed her own toielting adls    Ambulation/Gait Ambulation/Gait assistance: Min assist, Supervision Gait Distance (Feet): 200 Feet Assistive device: Rolling walker (2 wheels), None Gait Pattern/deviations: Step-through pattern Gait velocity: decreased     General Gait Details: Started with RW for hallway ambulation and pt tolerating well with supervision and steady gait.  Tried without AD in room - noted pt to have hands in a low guard position.  She had 1 LOB when leaving the restroom requiring min A to recover - pt reports felt like she had a "catch" in her back that caused her to stumble.  Stairs            Wheelchair Mobility     Tilt Bed    Modified Rankin (Stroke Patients Only)       Balance Overall balance assessment: Needs assistance Sitting-balance support: No upper extremity supported Sitting balance-Leahy Scale: Good     Standing balance support: No upper extremity supported Standing balance-Leahy Scale: Fair Standing balance comment: Ambulated without AD but did have 1 LOB; Steady with RW  Pertinent Vitals/Pain Pain Assessment Pain Assessment: 0-10 Pain Score: 6  Pain Location: back Pain Descriptors / Indicators: Discomfort, Sore Pain Intervention(s): Limited activity within patient's tolerance, Monitored during session, Heat applied, Premedicated before session (reports chronic pain, provided heat pack)    Home Living Family/patient expects to be discharged to:: Private residence Living Arrangements: Spouse/significant other Available Help at Discharge: Family;Available 24 hours/day (spouse or son home most of the  time) Type of Home: House Home Access: Stairs to enter   [employer] of Steps: 1   Home Layout: One level Home Equipment: Pharmacist, hospital (2 wheels);Wheelchair - manual      Prior Function Prior Level of Function : Independent/Modified Independent;Driving             Mobility Comments: Could ambulate in community without AD; denies falls ADLs Comments: independent adls and iadls     Extremity/Trunk Assessment   Upper Extremity Assessment Upper Extremity Assessment: Defer to OT evaluation    Lower Extremity Assessment Lower Extremity Assessment: Overall WFL for tasks assessed (ROM WFL: MMT 5/5)    Cervical / Trunk Assessment Cervical / Trunk Assessment: Normal  Communication      Cognition Arousal: Alert Behavior During Therapy: WFL for tasks assessed/performed Overall Cognitive Status: Within Functional Limits for tasks assessed                                          General Comments General comments (skin integrity, edema, etc.): Advised use of RW at this time    Exercises     Assessment/Plan    PT Assessment Patient needs continued PT services  PT Problem List Decreased strength;Pain;Decreased range of motion;Decreased activity tolerance;Decreased knowledge of use of DME;Decreased balance;Decreased mobility       PT Treatment Interventions DME instruction;Therapeutic exercise;Balance training;Gait training;Stair training;Functional mobility training;Therapeutic activities;Patient/family education;Neuromuscular re-education    PT Goals (Current goals can be found in the Care Plan section)  Acute Rehab PT Goals Patient Stated Goal: return home PT Goal Formulation: With patient/family Time For Goal Achievement: 09/27/22 Potential to Achieve Goals: Good Additional Goals Additional Goal #1: Pt will score > 19 on DGI to indicate lower fall risk    Frequency Min 1X/week     Co-evaluation                AM-PAC PT "6 Clicks" Mobility  Outcome Measure Help needed turning from your back to your side while in a flat bed without using bedrails?: None Help needed moving from lying on your back to sitting on the side of a flat bed without using bedrails?: None Help needed moving to and from a bed to a chair (including a wheelchair)?: A Little Help needed standing up from a chair using your arms (e.g., wheelchair or bedside chair)?: A Little Help needed to walk in hospital room?: A Little Help needed climbing 3-5 steps with a railing? : A Little 6 Click Score: 20    End of Session Equipment Utilized During Treatment: Gait belt Activity Tolerance: Patient tolerated treatment well Patient left: in chair;with call bell/phone within reach;with family/visitor present (family assisting pt - left alarm off) Nurse Communication: Mobility status PT Visit Diagnosis: Other abnormalities of gait and mobility (R26.89);Muscle weakness (generalized) (M62.81)    Time: 9629-5284 PT Time Calculation (min) (ACUTE ONLY): 22 min   Charges:   PT Evaluation $PT Eval Low Complexity: 1 Low  PT General Charges $$ ACUTE PT VISIT: 1 Visit         Anise Salvo, PT Acute Rehab Services [facility] [phone]   Rayetta Humphrey 09/13/2022, 11:34 AM

## 2022-09-15 LAB — BODY FLUID CULTURE W GRAM STAIN: Culture: NO GROWTH

## 2022-09-15 LAB — CULTURE, BLOOD (ROUTINE X 2)
Culture: NO GROWTH
Culture: NO GROWTH
Special Requests: ADEQUATE

## 2022-09-17 DIAGNOSIS — E785 Hyperlipidemia, unspecified: Secondary | ICD-10-CM | POA: Diagnosis not present

## 2022-09-17 DIAGNOSIS — M5031 Other cervical disc degeneration,  high cervical region: Secondary | ICD-10-CM | POA: Diagnosis not present

## 2022-09-17 DIAGNOSIS — Z79899 Other long term (current) drug therapy: Secondary | ICD-10-CM | POA: Diagnosis not present

## 2022-09-17 DIAGNOSIS — I491 Atrial premature depolarization: Secondary | ICD-10-CM | POA: Diagnosis not present

## 2022-09-17 DIAGNOSIS — R590 Localized enlarged lymph nodes: Secondary | ICD-10-CM | POA: Diagnosis not present

## 2022-09-17 DIAGNOSIS — S32018D Other fracture of first lumbar vertebra, subsequent encounter for fracture with routine healing: Secondary | ICD-10-CM | POA: Diagnosis not present

## 2022-09-17 DIAGNOSIS — I452 Bifascicular block: Secondary | ICD-10-CM | POA: Diagnosis not present

## 2022-09-17 DIAGNOSIS — N200 Calculus of kidney: Secondary | ICD-10-CM | POA: Diagnosis not present

## 2022-09-17 DIAGNOSIS — I499 Cardiac arrhythmia, unspecified: Secondary | ICD-10-CM | POA: Diagnosis not present

## 2022-09-17 DIAGNOSIS — R109 Unspecified abdominal pain: Secondary | ICD-10-CM | POA: Diagnosis not present

## 2022-09-17 DIAGNOSIS — C189 Malignant neoplasm of colon, unspecified: Secondary | ICD-10-CM | POA: Diagnosis not present

## 2022-09-17 DIAGNOSIS — Z7952 Long term (current) use of systemic steroids: Secondary | ICD-10-CM | POA: Diagnosis not present

## 2022-09-17 DIAGNOSIS — E119 Type 2 diabetes mellitus without complications: Secondary | ICD-10-CM | POA: Diagnosis not present

## 2022-09-17 DIAGNOSIS — I444 Left anterior fascicular block: Secondary | ICD-10-CM | POA: Diagnosis not present

## 2022-09-17 DIAGNOSIS — D259 Leiomyoma of uterus, unspecified: Secondary | ICD-10-CM | POA: Diagnosis not present

## 2022-09-17 DIAGNOSIS — R000 Tachycardia, unspecified: Secondary | ICD-10-CM | POA: Diagnosis not present

## 2022-09-17 DIAGNOSIS — R188 Other ascites: Secondary | ICD-10-CM | POA: Diagnosis not present

## 2022-09-17 DIAGNOSIS — R627 Adult failure to thrive: Secondary | ICD-10-CM | POA: Diagnosis not present

## 2022-09-17 DIAGNOSIS — K5669 Other partial intestinal obstruction: Secondary | ICD-10-CM | POA: Diagnosis not present

## 2022-09-17 DIAGNOSIS — C786 Secondary malignant neoplasm of retroperitoneum and peritoneum: Secondary | ICD-10-CM | POA: Diagnosis not present

## 2022-09-17 DIAGNOSIS — D84821 Immunodeficiency due to drugs: Secondary | ICD-10-CM | POA: Diagnosis not present

## 2022-09-17 DIAGNOSIS — J90 Pleural effusion, not elsewhere classified: Secondary | ICD-10-CM | POA: Diagnosis not present

## 2022-09-17 DIAGNOSIS — C7951 Secondary malignant neoplasm of bone: Secondary | ICD-10-CM | POA: Diagnosis not present

## 2022-09-17 DIAGNOSIS — Z7984 Long term (current) use of oral hypoglycemic drugs: Secondary | ICD-10-CM | POA: Diagnosis not present

## 2022-09-17 DIAGNOSIS — E041 Nontoxic single thyroid nodule: Secondary | ICD-10-CM | POA: Diagnosis not present

## 2022-09-17 DIAGNOSIS — E873 Alkalosis: Secondary | ICD-10-CM | POA: Diagnosis not present

## 2022-09-17 DIAGNOSIS — J9811 Atelectasis: Secondary | ICD-10-CM | POA: Diagnosis not present

## 2022-09-17 DIAGNOSIS — Z940 Kidney transplant status: Secondary | ICD-10-CM | POA: Diagnosis not present

## 2022-09-17 DIAGNOSIS — R143 Flatulence: Secondary | ICD-10-CM | POA: Diagnosis not present

## 2022-09-17 DIAGNOSIS — I10 Essential (primary) hypertension: Secondary | ICD-10-CM | POA: Diagnosis not present

## 2022-09-17 DIAGNOSIS — Z7969 Long term (current) use of other immunomodulators and immunosuppressants: Secondary | ICD-10-CM | POA: Diagnosis not present

## 2022-09-17 DIAGNOSIS — R531 Weakness: Secondary | ICD-10-CM | POA: Diagnosis not present

## 2022-09-17 DIAGNOSIS — Z79621 Long term (current) use of calcineurin inhibitor: Secondary | ICD-10-CM | POA: Diagnosis not present

## 2022-09-17 DIAGNOSIS — G893 Neoplasm related pain (acute) (chronic): Secondary | ICD-10-CM | POA: Diagnosis not present

## 2022-09-18 ENCOUNTER — Other Ambulatory Visit (HOSPITAL_COMMUNITY): Payer: Self-pay

## 2022-09-19 LAB — CYTOLOGY - NON PAP

## 2022-09-23 DIAGNOSIS — R180 Malignant ascites: Secondary | ICD-10-CM | POA: Diagnosis not present

## 2022-09-23 DIAGNOSIS — C189 Malignant neoplasm of colon, unspecified: Secondary | ICD-10-CM | POA: Diagnosis not present

## 2022-09-26 DIAGNOSIS — C786 Secondary malignant neoplasm of retroperitoneum and peritoneum: Secondary | ICD-10-CM | POA: Diagnosis not present

## 2022-09-26 DIAGNOSIS — C189 Malignant neoplasm of colon, unspecified: Secondary | ICD-10-CM | POA: Diagnosis not present

## 2022-09-26 DIAGNOSIS — R180 Malignant ascites: Secondary | ICD-10-CM | POA: Diagnosis not present

## 2022-09-27 DIAGNOSIS — E785 Hyperlipidemia, unspecified: Secondary | ICD-10-CM | POA: Diagnosis not present

## 2022-09-27 DIAGNOSIS — E119 Type 2 diabetes mellitus without complications: Secondary | ICD-10-CM | POA: Diagnosis not present

## 2022-09-27 DIAGNOSIS — I10 Essential (primary) hypertension: Secondary | ICD-10-CM | POA: Diagnosis not present

## 2022-09-27 DIAGNOSIS — R180 Malignant ascites: Secondary | ICD-10-CM | POA: Diagnosis not present

## 2022-09-27 DIAGNOSIS — Z940 Kidney transplant status: Secondary | ICD-10-CM | POA: Diagnosis not present

## 2022-09-27 DIAGNOSIS — C189 Malignant neoplasm of colon, unspecified: Secondary | ICD-10-CM | POA: Diagnosis not present

## 2022-09-28 DIAGNOSIS — R180 Malignant ascites: Secondary | ICD-10-CM | POA: Diagnosis not present

## 2022-09-28 DIAGNOSIS — E119 Type 2 diabetes mellitus without complications: Secondary | ICD-10-CM | POA: Diagnosis not present

## 2022-09-28 DIAGNOSIS — E785 Hyperlipidemia, unspecified: Secondary | ICD-10-CM | POA: Diagnosis not present

## 2022-09-28 DIAGNOSIS — Z940 Kidney transplant status: Secondary | ICD-10-CM | POA: Diagnosis not present

## 2022-09-28 DIAGNOSIS — C189 Malignant neoplasm of colon, unspecified: Secondary | ICD-10-CM | POA: Diagnosis not present

## 2022-09-28 DIAGNOSIS — I10 Essential (primary) hypertension: Secondary | ICD-10-CM | POA: Diagnosis not present

## 2022-09-29 DIAGNOSIS — E119 Type 2 diabetes mellitus without complications: Secondary | ICD-10-CM | POA: Diagnosis not present

## 2022-09-29 DIAGNOSIS — E785 Hyperlipidemia, unspecified: Secondary | ICD-10-CM | POA: Diagnosis not present

## 2022-09-29 DIAGNOSIS — Z940 Kidney transplant status: Secondary | ICD-10-CM | POA: Diagnosis not present

## 2022-09-29 DIAGNOSIS — R180 Malignant ascites: Secondary | ICD-10-CM | POA: Diagnosis not present

## 2022-09-29 DIAGNOSIS — C189 Malignant neoplasm of colon, unspecified: Secondary | ICD-10-CM | POA: Diagnosis not present

## 2022-09-29 DIAGNOSIS — I10 Essential (primary) hypertension: Secondary | ICD-10-CM | POA: Diagnosis not present

## 2022-09-30 DIAGNOSIS — R180 Malignant ascites: Secondary | ICD-10-CM | POA: Diagnosis not present

## 2022-09-30 DIAGNOSIS — I10 Essential (primary) hypertension: Secondary | ICD-10-CM | POA: Diagnosis not present

## 2022-09-30 DIAGNOSIS — E119 Type 2 diabetes mellitus without complications: Secondary | ICD-10-CM | POA: Diagnosis not present

## 2022-09-30 DIAGNOSIS — C189 Malignant neoplasm of colon, unspecified: Secondary | ICD-10-CM | POA: Diagnosis not present

## 2022-09-30 DIAGNOSIS — Z940 Kidney transplant status: Secondary | ICD-10-CM | POA: Diagnosis not present

## 2022-09-30 DIAGNOSIS — E785 Hyperlipidemia, unspecified: Secondary | ICD-10-CM | POA: Diagnosis not present

## 2022-10-01 DIAGNOSIS — Z940 Kidney transplant status: Secondary | ICD-10-CM | POA: Diagnosis not present

## 2022-10-01 DIAGNOSIS — I10 Essential (primary) hypertension: Secondary | ICD-10-CM | POA: Diagnosis not present

## 2022-10-01 DIAGNOSIS — R180 Malignant ascites: Secondary | ICD-10-CM | POA: Diagnosis not present

## 2022-10-01 DIAGNOSIS — C189 Malignant neoplasm of colon, unspecified: Secondary | ICD-10-CM | POA: Diagnosis not present

## 2022-10-01 DIAGNOSIS — E119 Type 2 diabetes mellitus without complications: Secondary | ICD-10-CM | POA: Diagnosis not present

## 2022-10-01 DIAGNOSIS — E785 Hyperlipidemia, unspecified: Secondary | ICD-10-CM | POA: Diagnosis not present

## 2022-10-02 DIAGNOSIS — I10 Essential (primary) hypertension: Secondary | ICD-10-CM | POA: Diagnosis not present

## 2022-10-02 DIAGNOSIS — Z940 Kidney transplant status: Secondary | ICD-10-CM | POA: Diagnosis not present

## 2022-10-02 DIAGNOSIS — R180 Malignant ascites: Secondary | ICD-10-CM | POA: Diagnosis not present

## 2022-10-02 DIAGNOSIS — E119 Type 2 diabetes mellitus without complications: Secondary | ICD-10-CM | POA: Diagnosis not present

## 2022-10-02 DIAGNOSIS — E785 Hyperlipidemia, unspecified: Secondary | ICD-10-CM | POA: Diagnosis not present

## 2022-10-02 DIAGNOSIS — C189 Malignant neoplasm of colon, unspecified: Secondary | ICD-10-CM | POA: Diagnosis not present

## 2022-10-03 DIAGNOSIS — M549 Dorsalgia, unspecified: Secondary | ICD-10-CM | POA: Diagnosis not present

## 2022-10-03 DIAGNOSIS — R000 Tachycardia, unspecified: Secondary | ICD-10-CM | POA: Diagnosis not present

## 2022-10-03 DIAGNOSIS — I10 Essential (primary) hypertension: Secondary | ICD-10-CM | POA: Diagnosis not present

## 2022-10-03 DIAGNOSIS — E785 Hyperlipidemia, unspecified: Secondary | ICD-10-CM | POA: Diagnosis not present

## 2022-10-03 DIAGNOSIS — E119 Type 2 diabetes mellitus without complications: Secondary | ICD-10-CM | POA: Diagnosis not present

## 2022-10-03 DIAGNOSIS — C189 Malignant neoplasm of colon, unspecified: Secondary | ICD-10-CM | POA: Diagnosis not present

## 2022-10-03 DIAGNOSIS — M545 Low back pain, unspecified: Secondary | ICD-10-CM | POA: Diagnosis not present

## 2022-10-03 DIAGNOSIS — R0902 Hypoxemia: Secondary | ICD-10-CM | POA: Diagnosis not present

## 2022-10-03 DIAGNOSIS — Z940 Kidney transplant status: Secondary | ICD-10-CM | POA: Diagnosis not present

## 2022-10-03 DIAGNOSIS — R112 Nausea with vomiting, unspecified: Secondary | ICD-10-CM | POA: Diagnosis not present

## 2022-10-03 DIAGNOSIS — R180 Malignant ascites: Secondary | ICD-10-CM | POA: Diagnosis not present

## 2022-10-04 DIAGNOSIS — R180 Malignant ascites: Secondary | ICD-10-CM | POA: Diagnosis not present

## 2022-10-04 DIAGNOSIS — E785 Hyperlipidemia, unspecified: Secondary | ICD-10-CM | POA: Diagnosis not present

## 2022-10-04 DIAGNOSIS — Z940 Kidney transplant status: Secondary | ICD-10-CM | POA: Diagnosis not present

## 2022-10-04 DIAGNOSIS — E119 Type 2 diabetes mellitus without complications: Secondary | ICD-10-CM | POA: Diagnosis not present

## 2022-10-04 DIAGNOSIS — C189 Malignant neoplasm of colon, unspecified: Secondary | ICD-10-CM | POA: Diagnosis not present

## 2022-10-04 DIAGNOSIS — I10 Essential (primary) hypertension: Secondary | ICD-10-CM | POA: Diagnosis not present

## 2022-10-15 DEATH — deceased

## 2023-02-10 ENCOUNTER — Other Ambulatory Visit: Payer: Self-pay
